# Patient Record
Sex: Female | Born: 1965 | Race: White | Hispanic: No
Health system: Western US, Academic
[De-identification: ages and names within clinical notes are randomized; demographics above are authoritative.]

## PROBLEM LIST (undated history)

## (undated) DIAGNOSIS — Z9889 Other specified postprocedural states: Secondary | ICD-10-CM

## (undated) DIAGNOSIS — Z87828 Personal history of other (healed) physical injury and trauma: Secondary | ICD-10-CM

## (undated) DIAGNOSIS — K759 Inflammatory liver disease, unspecified: Secondary | ICD-10-CM

## (undated) DIAGNOSIS — F3289 Other specified depressive episodes: Secondary | ICD-10-CM

## (undated) HISTORY — PX: NO PRIOR SURGERIES: 100

## (undated) HISTORY — DX: Inflammatory liver disease, unspecified: K75.9

## (undated) HISTORY — DX: Other specified postprocedural states: Z98.890

## (undated) HISTORY — DX: Personal history of other (healed) physical injury and trauma: Z87.828

## (undated) HISTORY — DX: Other specified depressive episodes: F32.89

---

## 2013-09-09 ENCOUNTER — Telehealth (HOSPITAL_BASED_OUTPATIENT_CLINIC_OR_DEPARTMENT_OTHER): Payer: Self-pay | Admitting: Orthopaedic Surgery

## 2013-09-09 NOTE — Telephone Encounter (Signed)
SCHEDULING INSTRUCTIONS WERE ADDED IN

## 2013-09-09 NOTE — Telephone Encounter (Signed)
CONFIRMED PHONE NUMBER: [phone]  CALLERS FIRST AND LAST NAME: Doyne KeelValerie Ann Erskin  FACILITY NAME: na TITLE: na  CALLERS RELATIONSHIP:Self  RETURN CALL: General message on voicemail only     SUBJECT: Appointment Request   REASON FOR REQUEST: Patient request to set appointment with hand clinic no instruction yet,and she is on [employer] plan,please call her back. Thank you    REQUEST APPOINTMENT WITH: any  REFERRING PROVIDER: Dr. Reece LevyOSHANRAVAN, [facility] Inc[facility]IKA MADELEINE [phone]  REQUESTED DATE: asap  REQUESTED TIME: please call  UNABLE TO APPOINT: Other: No instruction Hmo plan

## 2013-09-09 NOTE — Telephone Encounter (Signed)
Patient scheduled using VTA (CCR could not access referral because clinic was entering in scheduling instructions at the same time patient was calling).    FYI.  Schedule with Gildardo Poundsuth Cook for 09/16/13.  New Patient disclosures read to patient & she declined to have call to check if medical records were sent & to hand carry images.  She wants the clinic to call referring provider and have them ordered.

## 2013-09-10 ENCOUNTER — Telehealth (HOSPITAL_BASED_OUTPATIENT_CLINIC_OR_DEPARTMENT_OTHER): Payer: Self-pay | Admitting: Adult Health

## 2013-09-10 NOTE — Telephone Encounter (Signed)
CONFIRMED PHONE NUMBER: [phone]  CALLERS FIRST AND LAST NAME: Doyne KeelValerie Ann Reed  FACILITY NAME: n/a TITLE: n/a  CALLERS RELATIONSHIP:Self  RETURN CALL: Detailed message on voicemail only     SUBJECT: Appointment Request   REASON FOR REQUEST: Wrist pain, patient cancelled appointment 3-24 because she starts work that day    REQUEST APPOINTMENT WITH: any  REFERRING PROVIDER: Arville Gorika Roshanravan  REQUESTED DATE: 09-10-13 to 09-15-13  REQUESTED TIME: any time besides Monday from 3-4 or Thursday from 1-3  UNABLE TO APPOINT: Schedule appears full

## 2013-09-12 ENCOUNTER — Ambulatory Visit (HOSPITAL_BASED_OUTPATIENT_CLINIC_OR_DEPARTMENT_OTHER): Payer: No Typology Code available for payment source | Attending: Adult Health | Admitting: Adult Health

## 2013-09-12 ENCOUNTER — Encounter (HOSPITAL_BASED_OUTPATIENT_CLINIC_OR_DEPARTMENT_OTHER): Payer: Self-pay | Admitting: Adult Health

## 2013-09-12 VITALS — BP 101/67 | HR 82 | Resp 18 | Ht 66.0 in | Wt 150.0 lb

## 2013-09-12 DIAGNOSIS — M24139 Other articular cartilage disorders, unspecified wrist: Secondary | ICD-10-CM | POA: Insufficient documentation

## 2013-09-12 DIAGNOSIS — M25539 Pain in unspecified wrist: Secondary | ICD-10-CM | POA: Insufficient documentation

## 2013-09-12 DIAGNOSIS — M25532 Pain in left wrist: Secondary | ICD-10-CM | POA: Insufficient documentation

## 2013-09-12 DIAGNOSIS — M25332 Other instability, left wrist: Secondary | ICD-10-CM

## 2013-09-12 NOTE — Patient Instructions (Signed)
Please wear your splint 24/7 except to bathe and return on 10-22-13 for follow up appointment with Dr. Isaias CowmanAllan.
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
## 2013-09-16 ENCOUNTER — Encounter (HOSPITAL_BASED_OUTPATIENT_CLINIC_OR_DEPARTMENT_OTHER): Payer: No Typology Code available for payment source | Admitting: Adult Health

## 2013-10-22 ENCOUNTER — Ambulatory Visit (HOSPITAL_BASED_OUTPATIENT_CLINIC_OR_DEPARTMENT_OTHER): Payer: No Typology Code available for payment source | Attending: Orthopaedic Surgery | Admitting: Orthopaedic Surgery

## 2013-10-22 ENCOUNTER — Encounter (HOSPITAL_BASED_OUTPATIENT_CLINIC_OR_DEPARTMENT_OTHER): Payer: Self-pay | Admitting: Orthopaedic Surgery

## 2013-10-22 ENCOUNTER — Ambulatory Visit (HOSPITAL_BASED_OUTPATIENT_CLINIC_OR_DEPARTMENT_OTHER)
Payer: No Typology Code available for payment source | Attending: Orthopaedic Surgery | Admitting: Rehabilitative and Restorative Service Providers"

## 2013-10-22 VITALS — BP 97/70 | HR 71 | Ht 66.0 in | Wt 150.0 lb

## 2013-10-22 DIAGNOSIS — M25532 Pain in left wrist: Secondary | ICD-10-CM

## 2013-10-22 DIAGNOSIS — M25539 Pain in unspecified wrist: Secondary | ICD-10-CM | POA: Insufficient documentation

## 2013-10-22 DIAGNOSIS — IMO0001 Reserved for inherently not codable concepts without codable children: Secondary | ICD-10-CM | POA: Insufficient documentation

## 2013-10-22 NOTE — Progress Notes (Signed)
L-Code Note     A   left ulnar gutter custom orthosis was fabricated by this therapist on 10/22/2013. In addition, a custom neoprene orthosis was fabricated to wear in two weeks after letting wrist rest in custom orthosis.  Pt. was given verbal and written instructions for wear, care, precautions, and clinic contact information should problems arise.     Patient endorsed comfort and agreed with purpose and goal of orthosis function.      L-Code: Z6109L3906 - custom   L-Code: U0454: L3923 - custom neoprene     Statement of Medical Necessity / Purpose of Orthosis: To rest the ulnar side of the wrist     Total time:  7745    Freeman CaldronKristy Uddin, OTR/L, CHT  Occupational Therapist, Certified Art therapistHand Therapist  Physical and Hand Therapy   Department of Rehabilitation Medicine

## 2013-10-23 ENCOUNTER — Telehealth (HOSPITAL_BASED_OUTPATIENT_CLINIC_OR_DEPARTMENT_OTHER): Payer: Self-pay | Admitting: Orthopaedic Surgery

## 2013-10-23 NOTE — Telephone Encounter (Signed)
CONFIRMED PHONE NUMBER: [phone]  CALLERS FIRST AND LAST NAME: Diane  FACILITY NAME: [facility] TITLE: NA  CALLERS RELATIONSHIP:OTHER  RETURN CALL: OK to leave detailed message with anyone that answers     SUBJECT: Requesting Orders   REASON FOR REQUEST: Diane from [facility] called requesting orders for occupational therapy to be submitted for the patient. If there are any questions please call [phone]. Okay to leave any information with either Orpha BurKaty or Mandy at the front desk.     ORDERING PROVIDER: Dr. Landis Martinshristopher Allan  REQUESTS AN ORDER FOR: Other Occupational Therapy  ABLE TO PICK UP LAB SLIP AT FRONT DESK: NO, please fax to [phone]

## 2013-10-23 NOTE — Telephone Encounter (Signed)
OT referral faxed to the fax # provided.
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
## 2013-12-03 ENCOUNTER — Encounter (HOSPITAL_BASED_OUTPATIENT_CLINIC_OR_DEPARTMENT_OTHER): Payer: No Typology Code available for payment source | Admitting: Orthopaedic Surgery

## 2017-01-13 IMAGING — MG MAMMO SCRN BIL W/CAD TOMO
8 series · 8 of 24 positions shown · non-contrast
Comparison: none

Images Obtained from [facility]
CLINICAL RA REF: Mammo Scrn bilateral (Digital) W/Cad Routine with Tomosynthesis.
Digital images were generated from the 3D Tomosynthesis data acquired during the exam.
No prior exams were available for comparison.
The breasts are heterogeneously dense, which may obscure small masses.
Current study was also evaluated with a Computer Aided Detection (CAD) system.
There is a possible oval mass with an obscured margin in the right breast middle depth lateral region seen on the craniocaudal view only.
No other significant abnormalities are seen in either breast.
Your patient's mammogram demonstrates that she has dense breast tissue, which could hide small abnormalities.  In compliance with [place] the patient has been sent a letter which informs
her that she has dense breast tissue and might benefit from supplementary screening tests depending on her individual risk factors.  The patient may contact you if she has any questions or concerns.

[L CC]
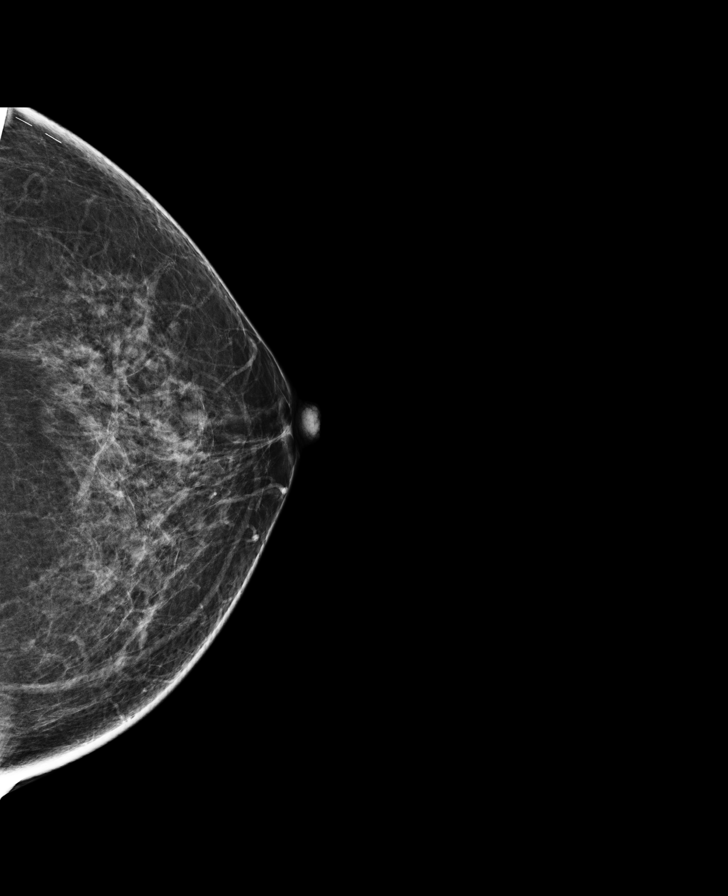

[R MLO]
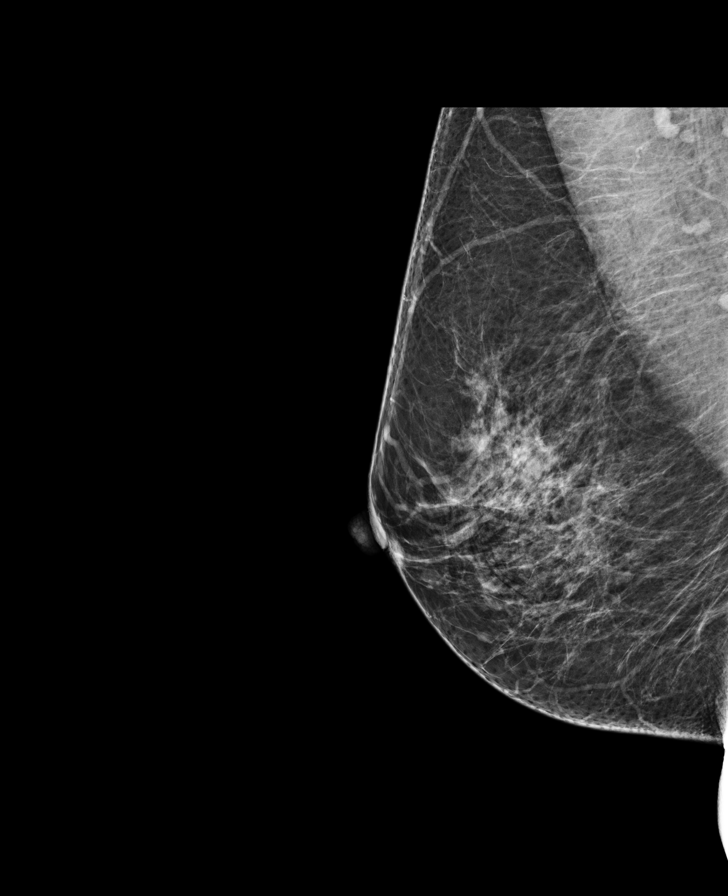

[L MLO]
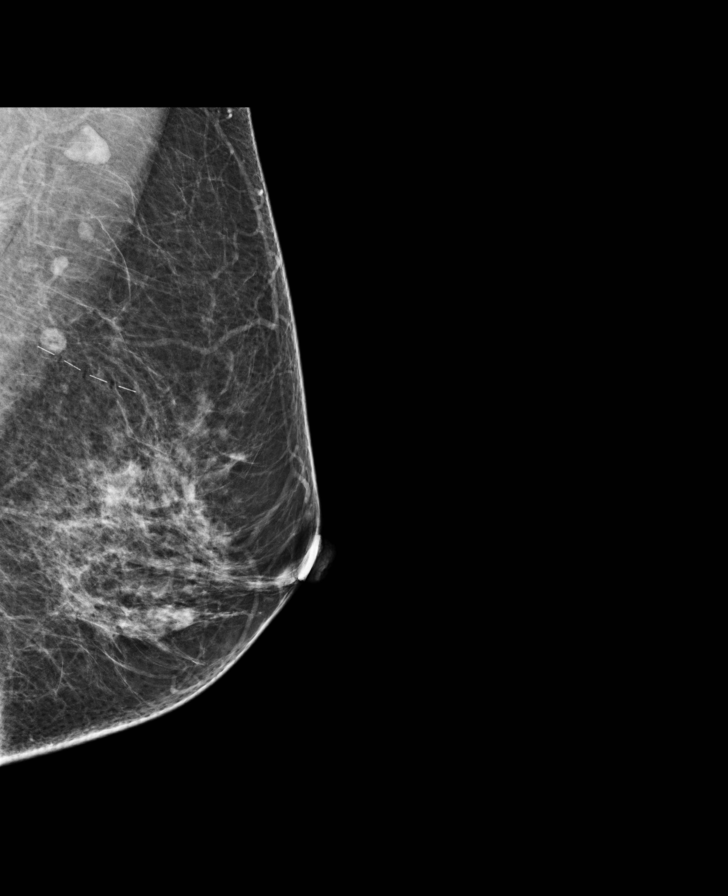

[R CC]
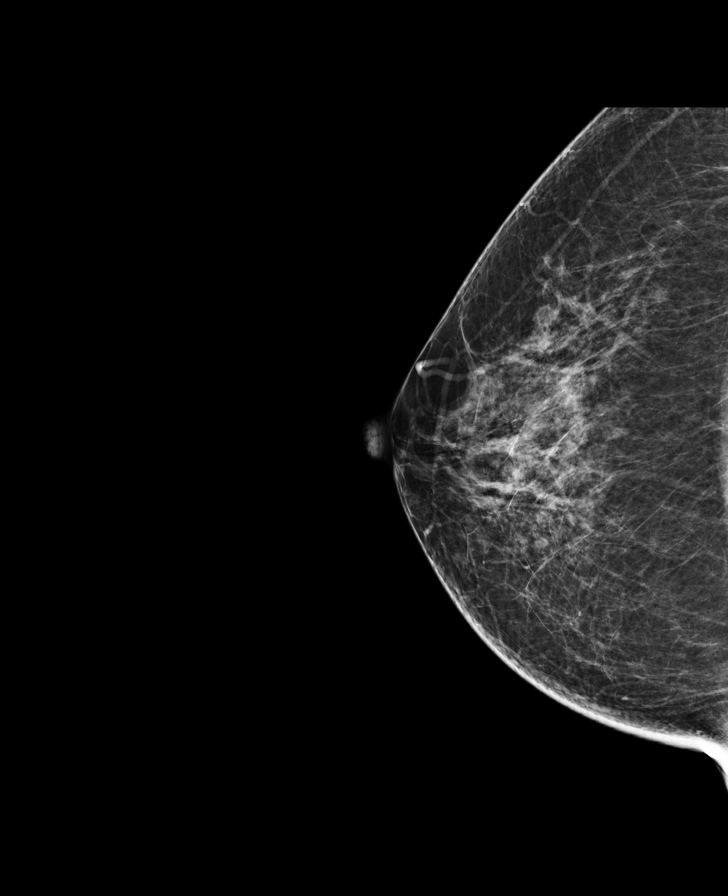

[L CC tomo · tomo slice 31/61.0]
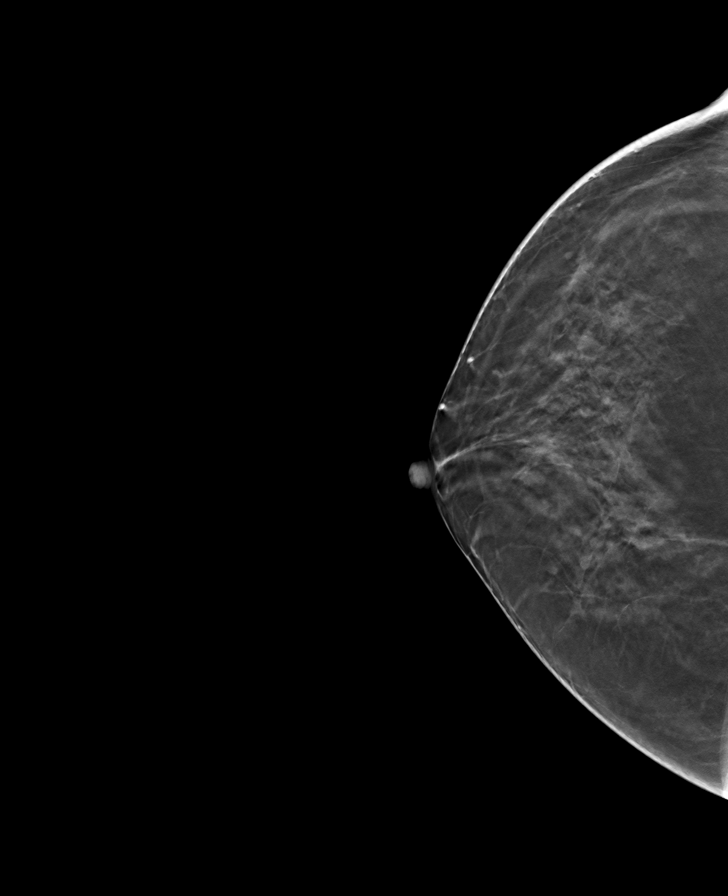

[R CC tomo · tomo slice 32/63.0]
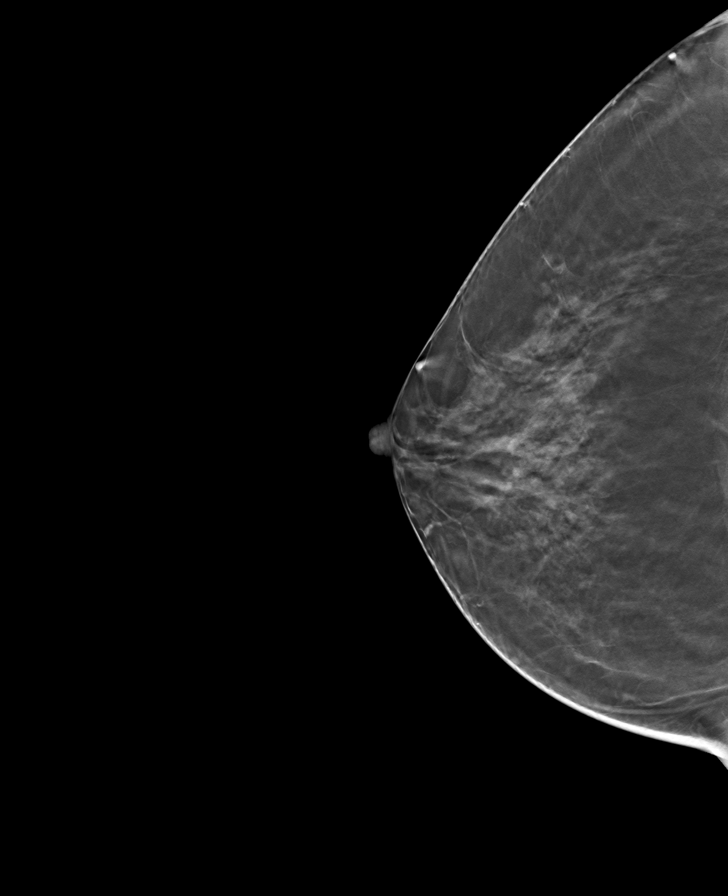

[R MLO tomo · tomo slice 33/66.0]
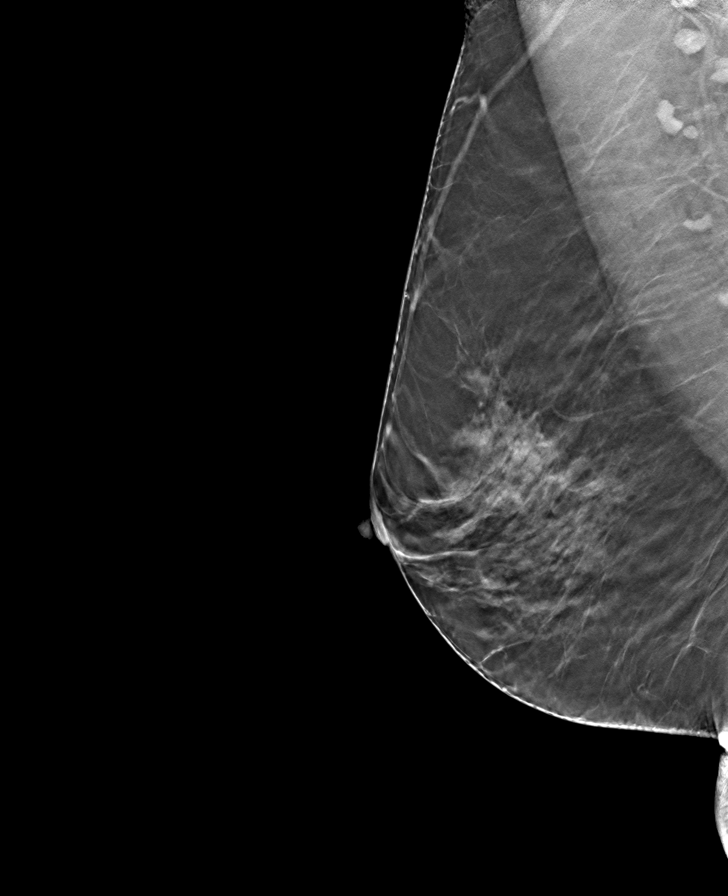

[L MLO tomo · tomo slice 33/64.0]
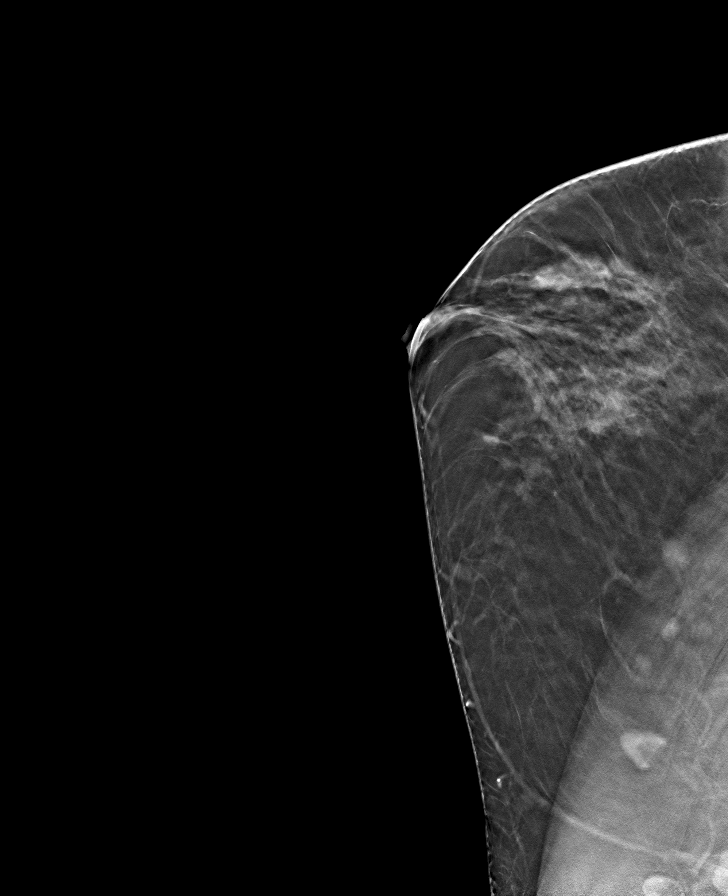

[8 of 24 positions shown; findings below may reference images not displayed]

IMPRESSION: INCOMPLETE:  NEED ADDITIONAL IMAGING EVALUATION
The possible oval mass in the right breast is indeterminate.  Additional views with possible ultrasound are recommended unless previous films are received and show no significant interval change.
mwm/penrad:01/24/2017 [DATE]
letter sent: Birad 0
FINAL ASSESSMENT: BI-RADS:Category 0: Incomplete:  Need Additional Imaging Evaluation

## 2017-03-12 IMAGING — MG MAMMO DIAG RT W/CAD TOMO
6 series · 6 of 18 positions shown · non-contrast
Comparison: 01/13/2017.

INDICATION: Right breast mass.
TECHNIQUE: Right 2-D digital diagnostic mammogram was performed followed by 3-D tomosynthesis. Current study was also evaluated with a computer aided detection (CAD) system. Targeted right breast ultrasound was also performed.

[R LM]
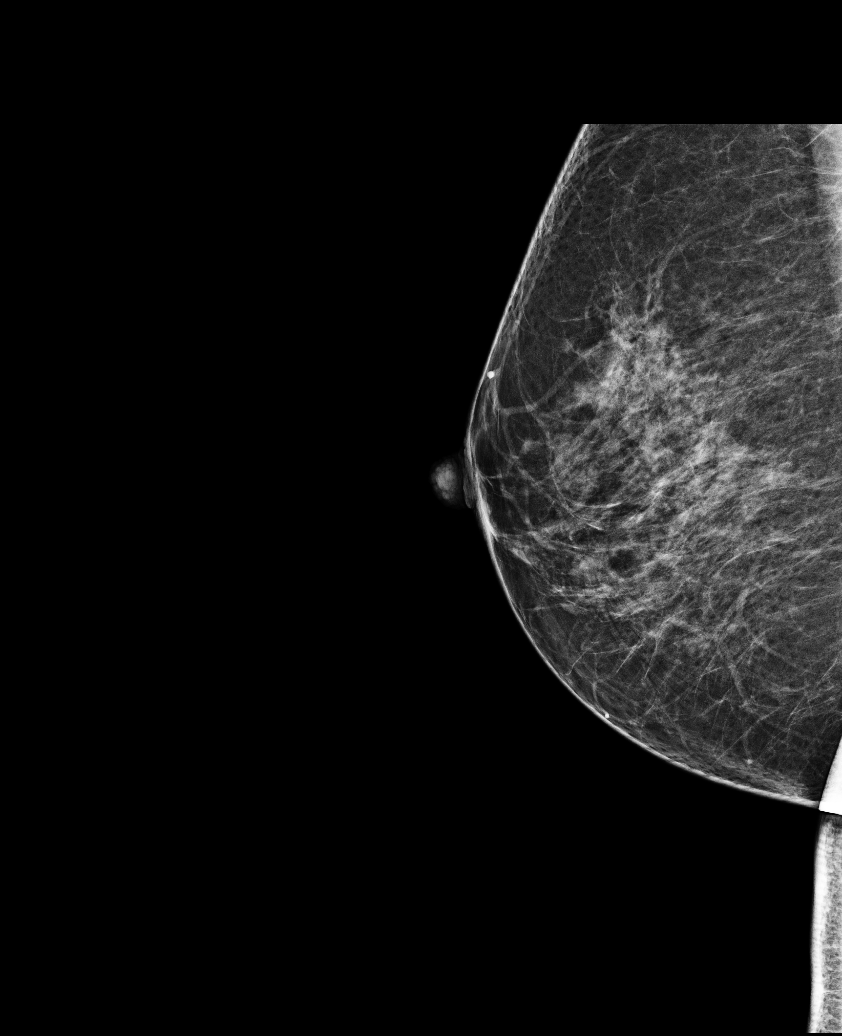

[R CC (1 of 2)]
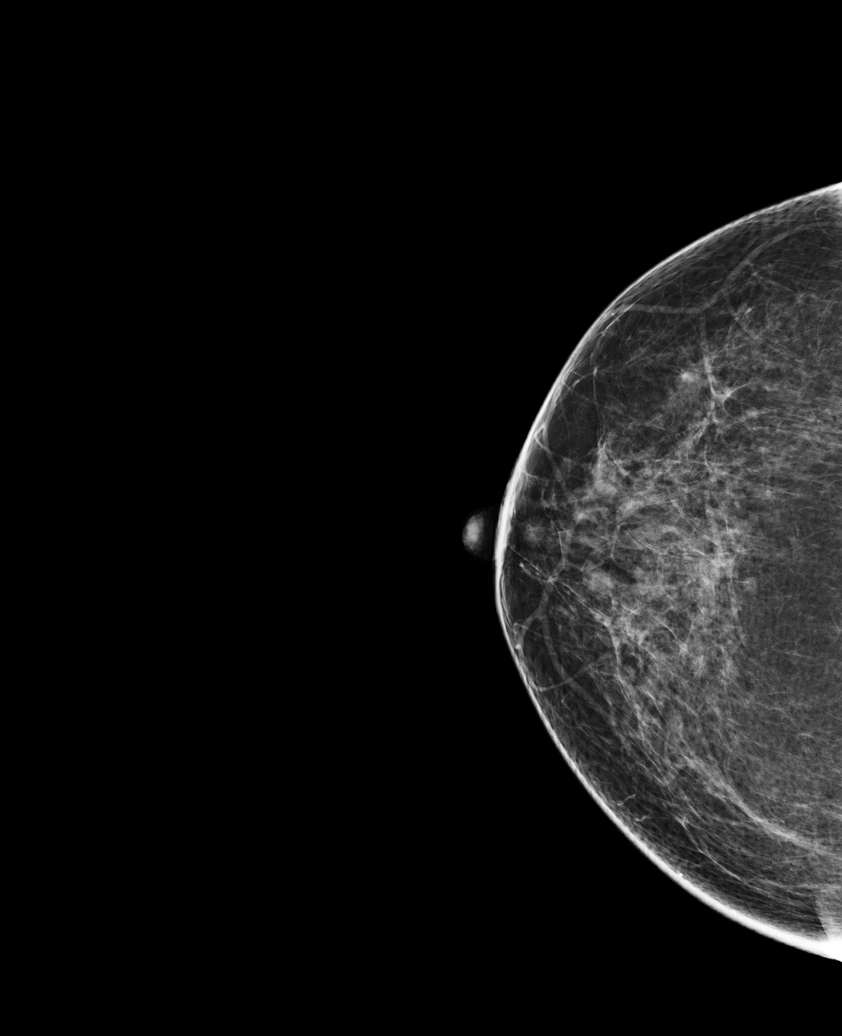

[R CC (2 of 2)]
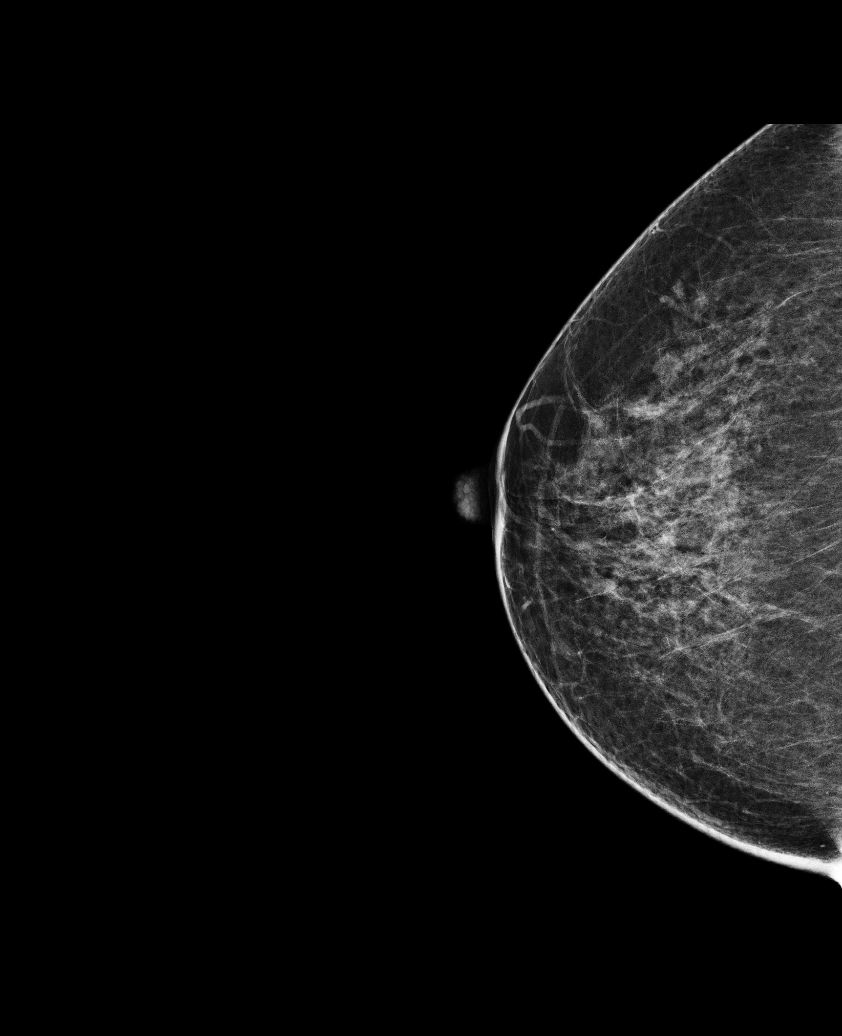

[R CC tomo (1 of 2) · tomo slice 34/67.0]
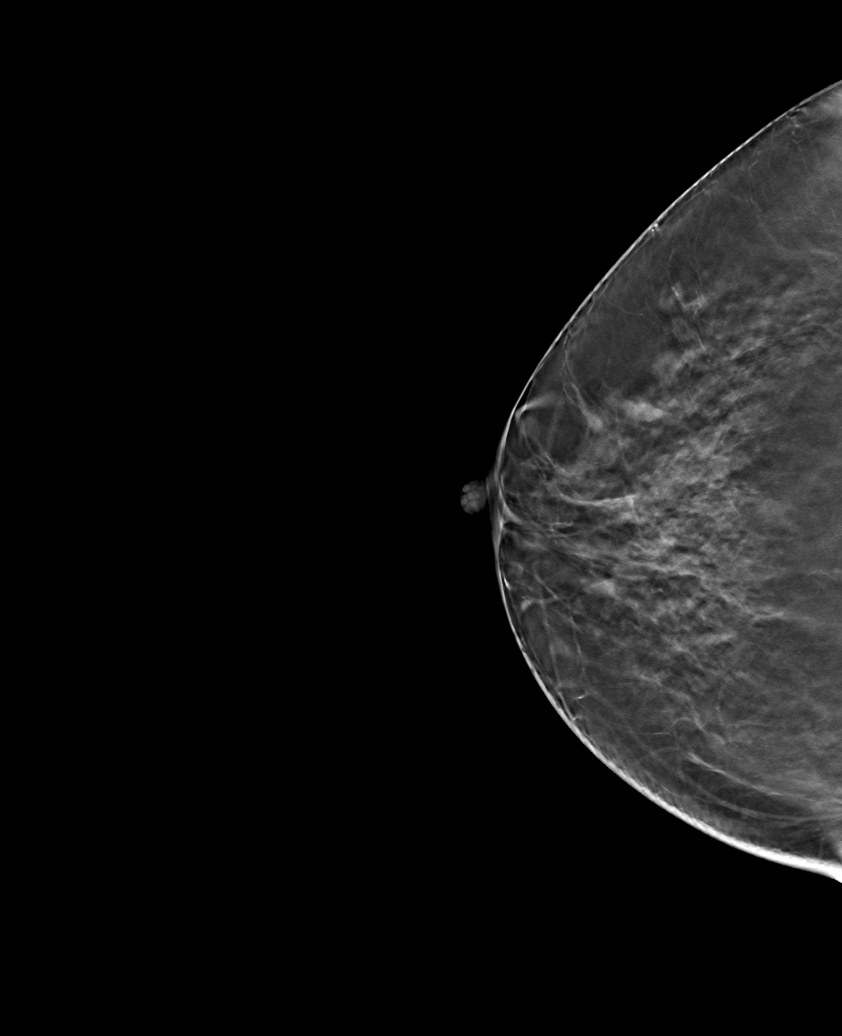

[R LM tomo · tomo slice 33/64.0]
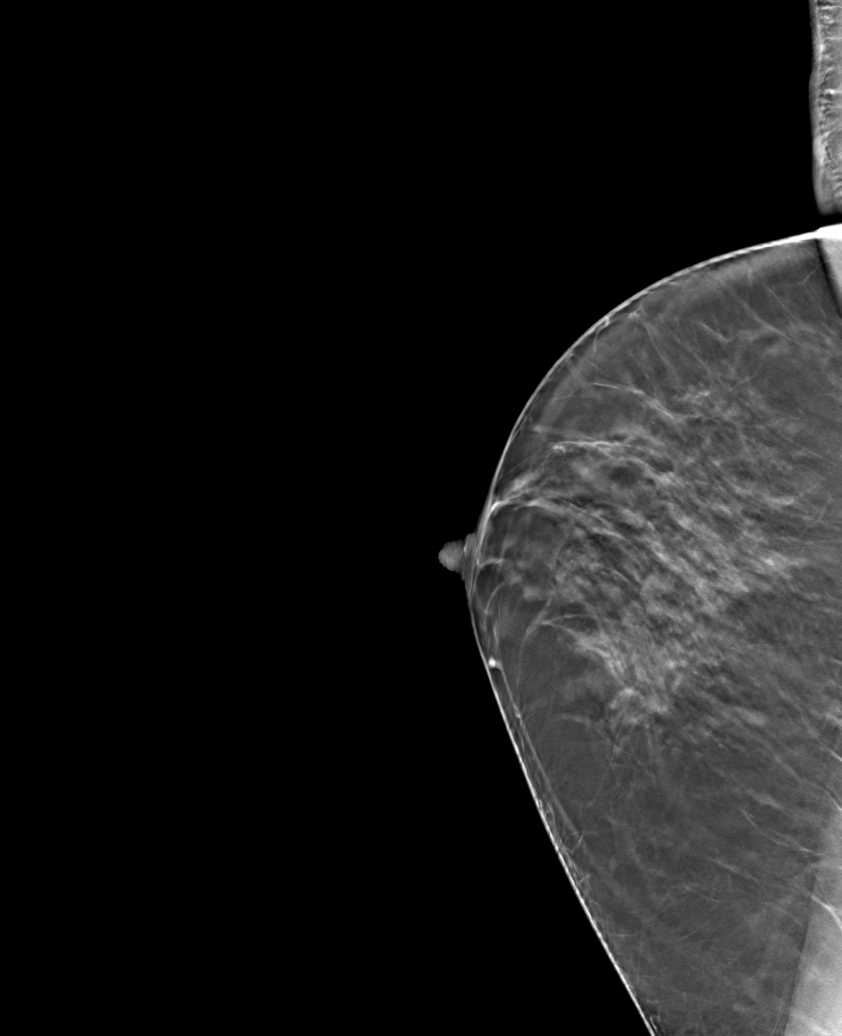

[R CC tomo (2 of 2) · tomo slice 34/67.0]
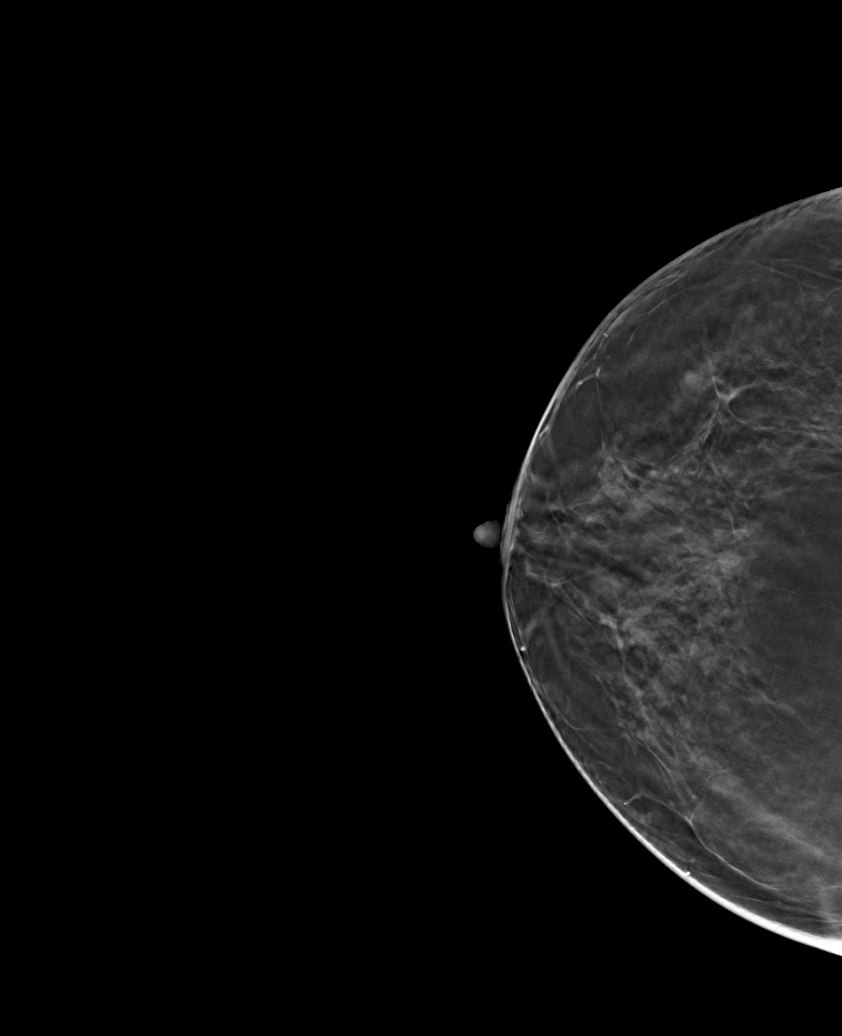

[6 of 18 positions shown; findings below may reference images not displayed]

FINDINGS: RIGHT DIAGNOSTIC MAMMOGRAM:  The breast is heterogeneously dense, which may obscure small masses.  Additional views confirm a circumscribed oval mass in the right breast 9 o'clock middle depth.

TARGETED RIGHT BREAST ULTRASOUND: In the right breast at 9 o'clock middle depth there is a 0.5 cm benign simple cyst. This corresponds to the mammographic finding. No suspicious sonographic abnormality.
IMPRESSION: There is no mammographic or targeted sonographic evidence of malignancy. A 1 year screening mammogram is recommended.  

FINAL ASSESSMENT: BI-RADS: Category 2 Benign
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
## 2017-03-12 IMAGING — US US BREAST RT LTD
1 series · 11 of 11 positions shown · non-contrast
Comparison: 01/13/2017.

INDICATION: Right breast mass.
TECHNIQUE: Right 2-D digital diagnostic mammogram was performed followed by 3-D tomosynthesis. Current study was also evaluated with a computer aided detection (CAD) system. Targeted right breast ultrasound was also performed.

[Series 1: us breast right ltd · 11 of 11 slices shown]
[im 1/11]
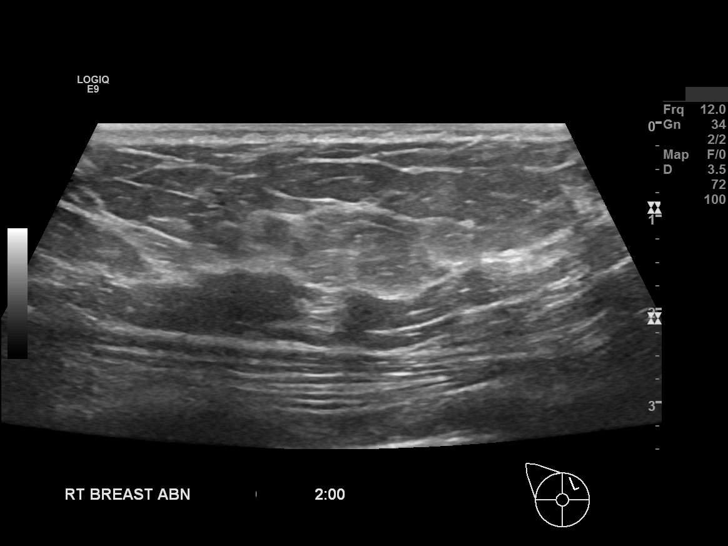
[im 2/11]
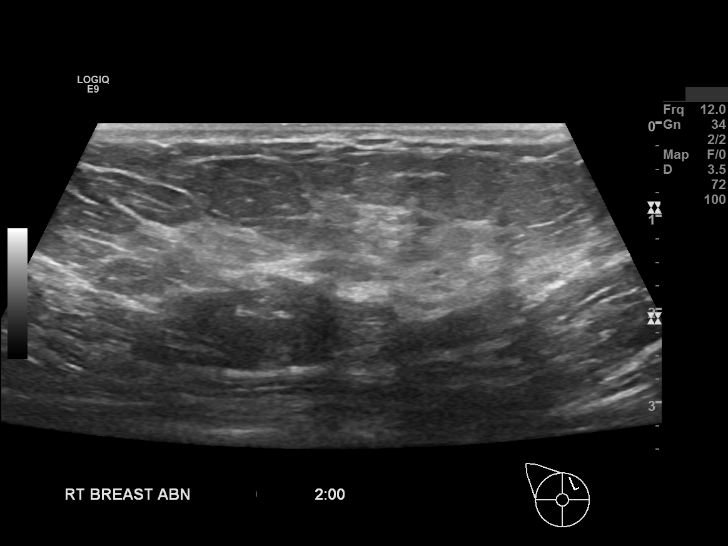
[im 3/11]
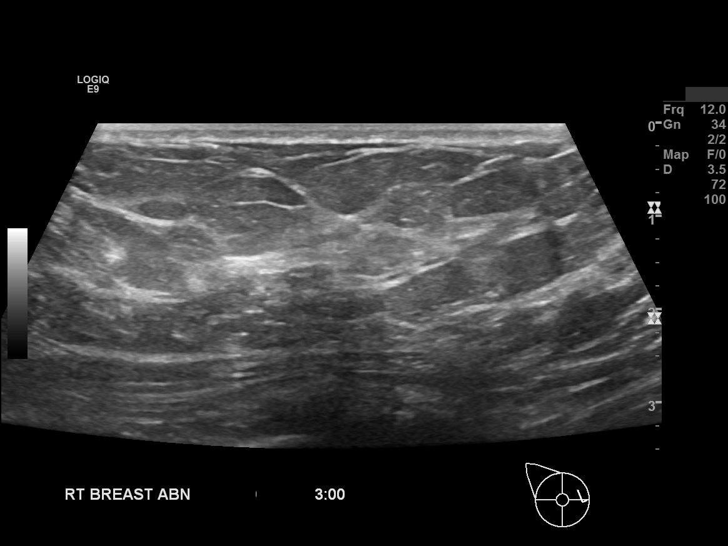
[im 4/11]
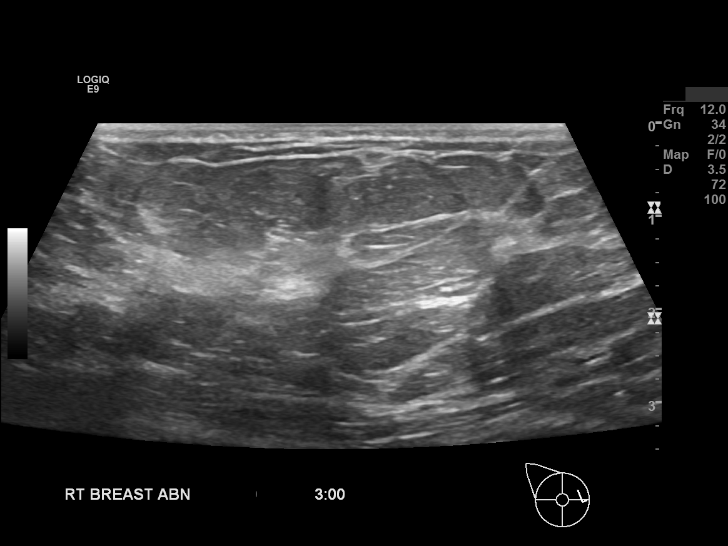
[im 5/11]
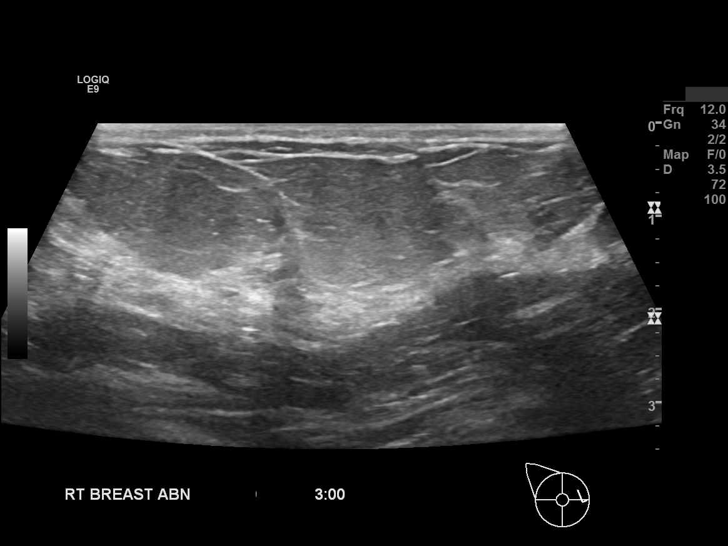
[im 6/11]
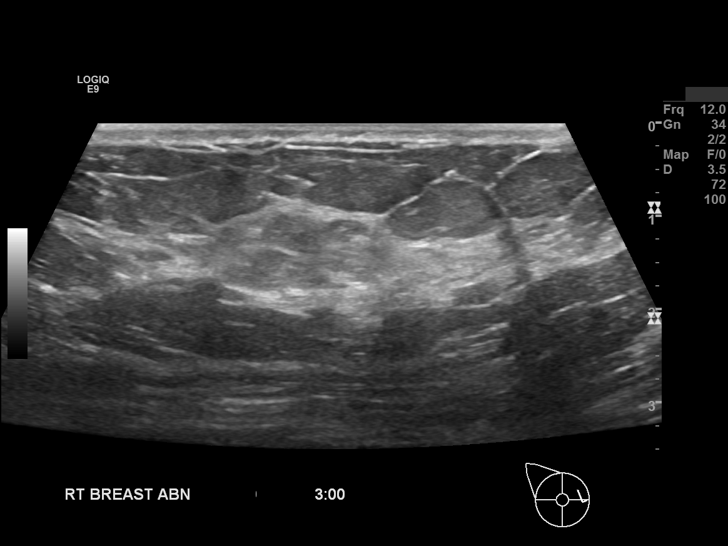
[im 7/11]
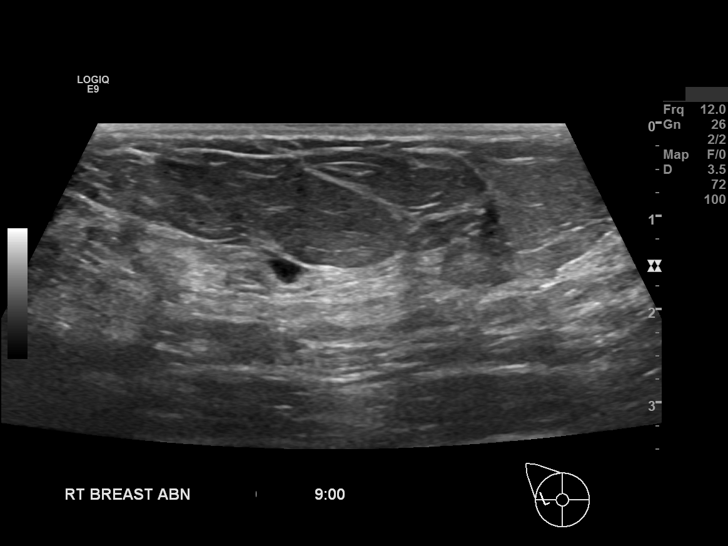
[im 8/11]
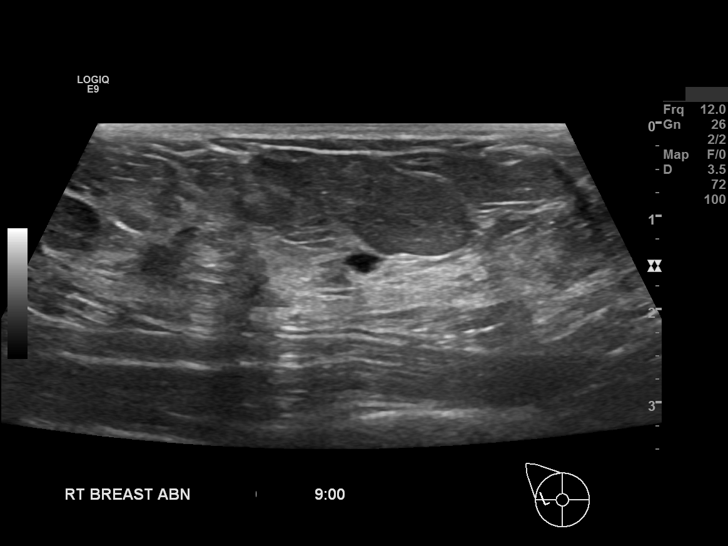
[im 9/11]
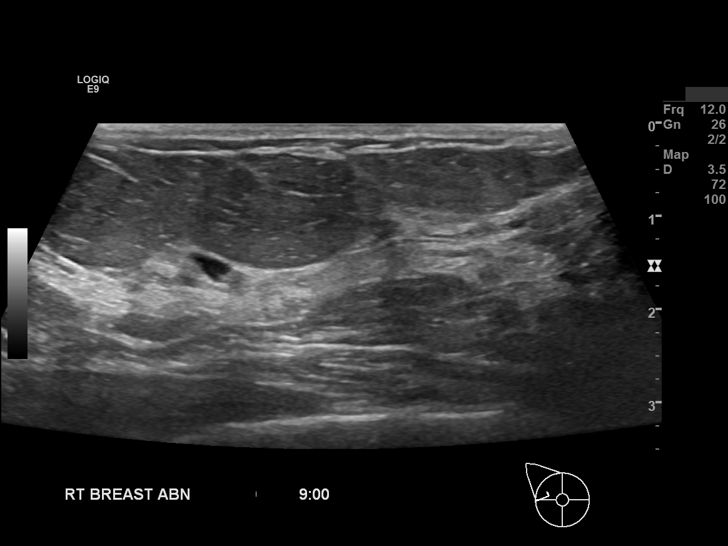
[im 10/11]
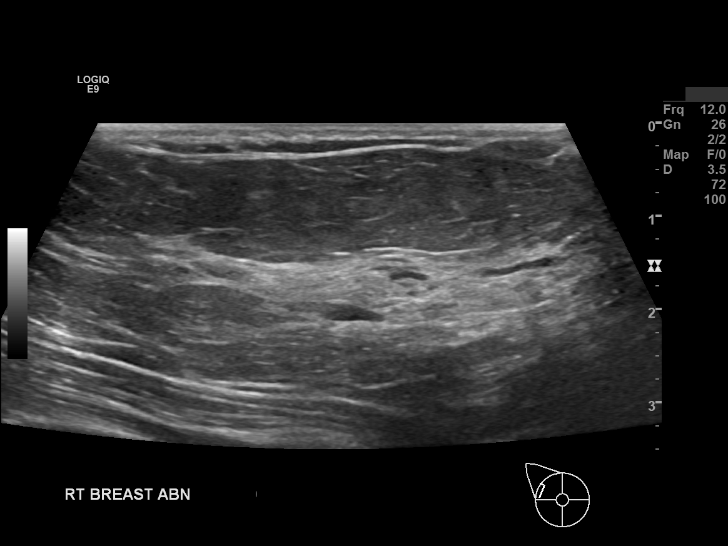
[im 11/11]
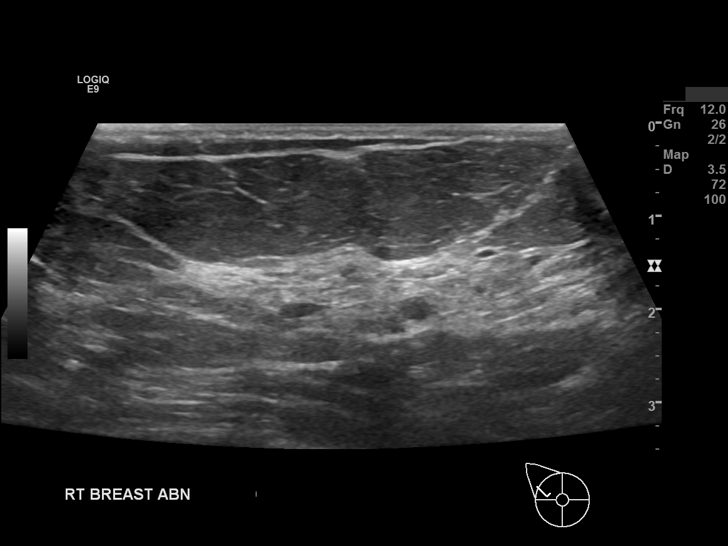

[11 of 11 positions shown; findings below may reference images not displayed]

FINDINGS: RIGHT DIAGNOSTIC MAMMOGRAM:  The breast is heterogeneously dense, which may obscure small masses.  Additional views confirm a circumscribed oval mass in the right breast 9 o'clock middle depth.

TARGETED RIGHT BREAST ULTRASOUND: In the right breast at 9 o'clock middle depth there is a 0.5 cm benign simple cyst. This corresponds to the mammographic finding. No suspicious sonographic abnormality.
IMPRESSION: There is no mammographic or targeted sonographic evidence of malignancy. A 1 year screening mammogram is recommended.  

FINAL ASSESSMENT: BI-RADS: Category 2 Benign

## 2017-03-12 IMAGING — US US LIVER
1 series · 14 of 25 positions shown · non-contrast
Comparison: None

Ultrasound liver
INDICATION: Nutritional disease.
TECHNIQUE: Ultrasound liver obtained with grayscale.

[Series 1: us liver · 14 of 29 slices shown]
[im 1/29]
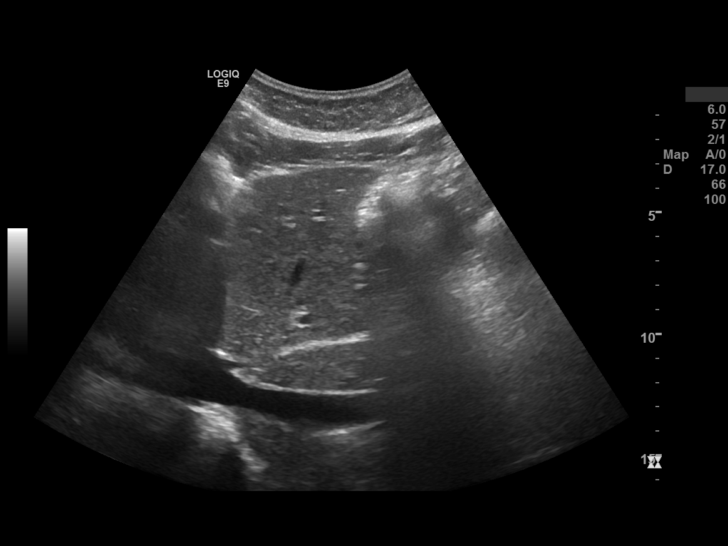
[im 3/29]
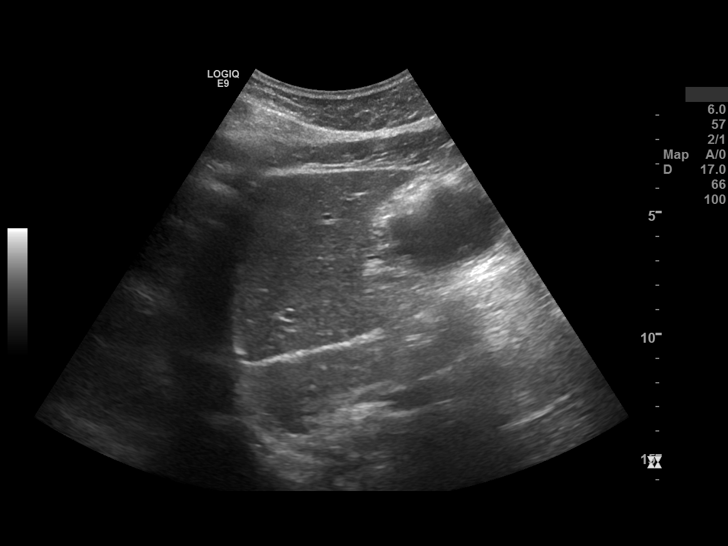
[im 5/29]
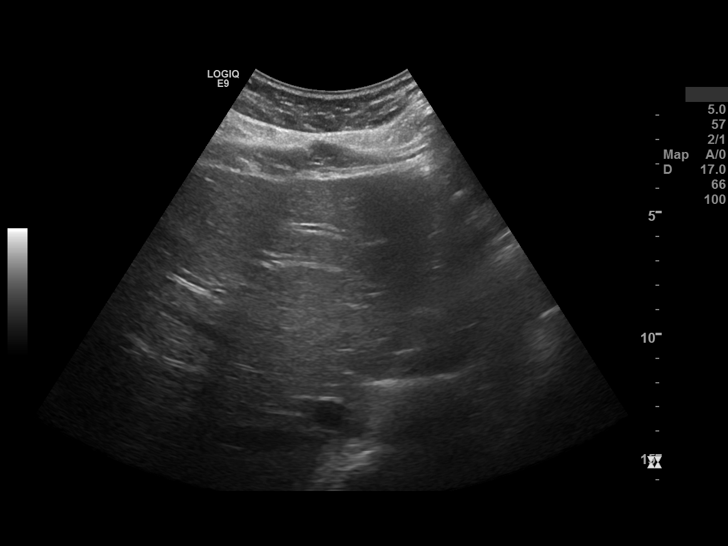
[im 8/29]
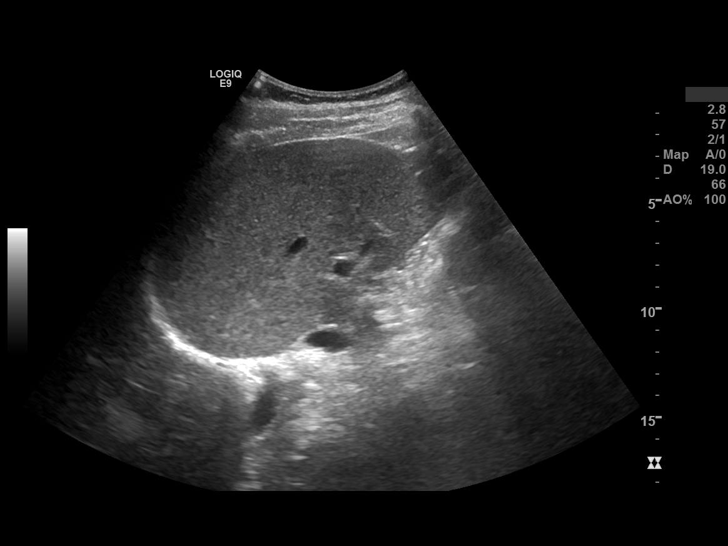
[im 10/29]
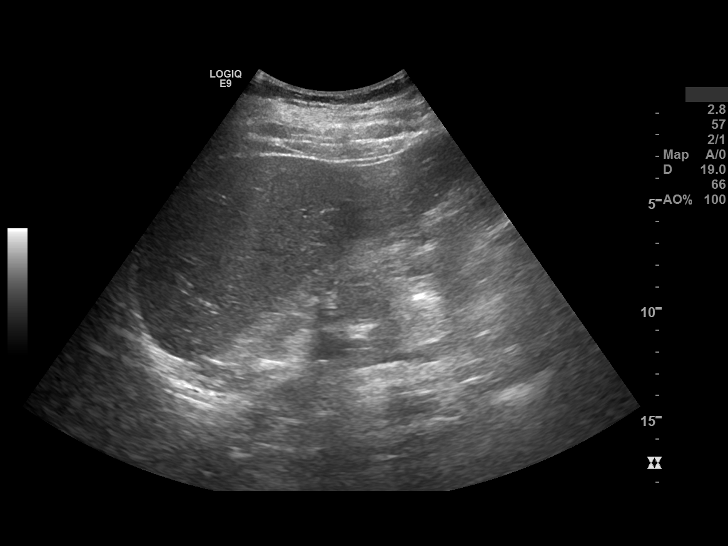
[im 11/29]
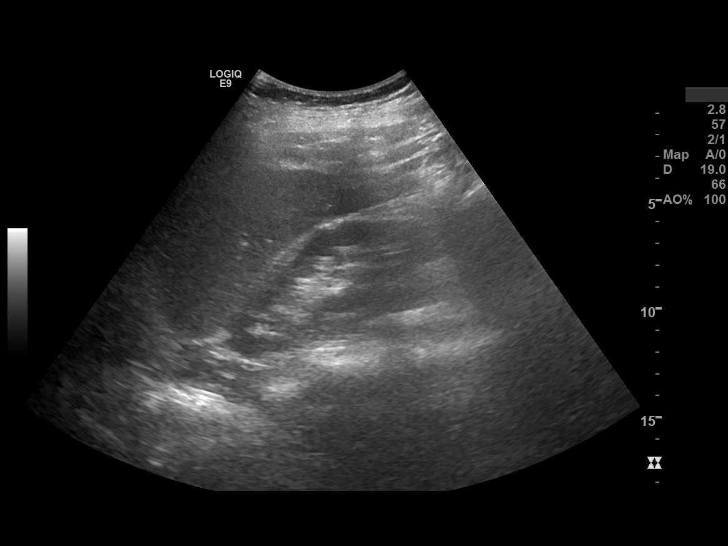
[im 13/29]
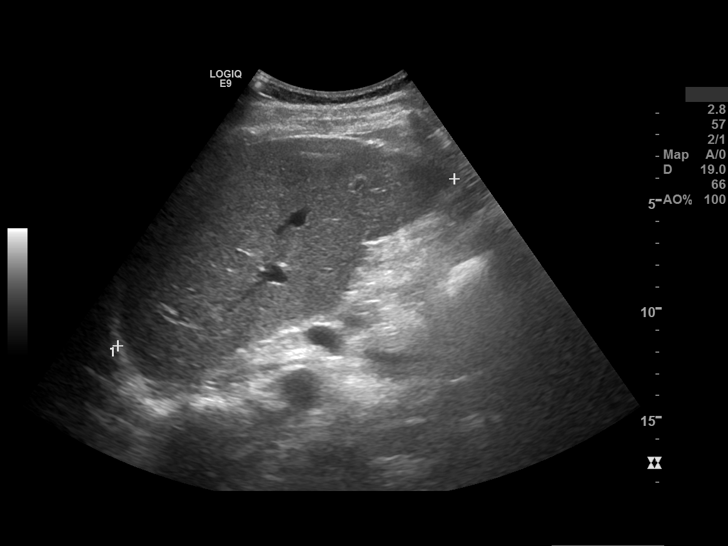
[im 16/29]
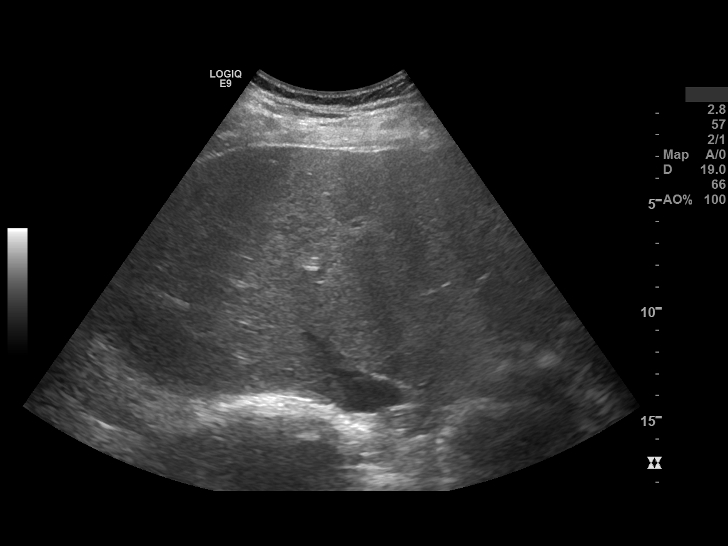
[im 18/29]
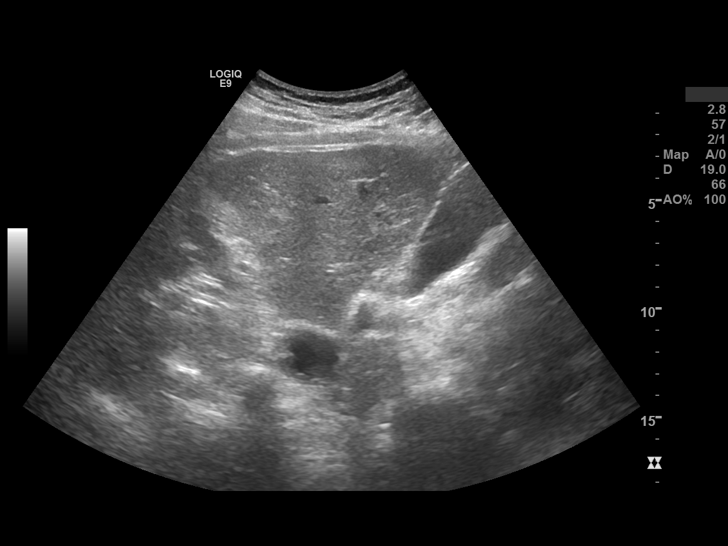
[im 19/29]
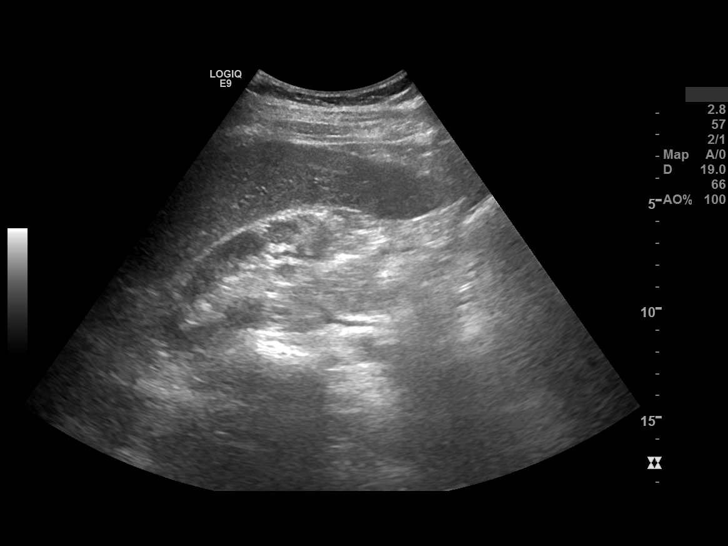
[im 22/29]
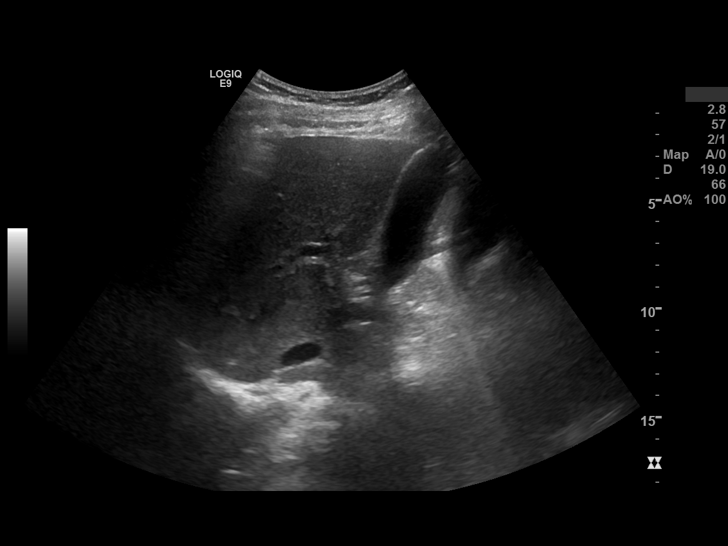
[im 24/29]
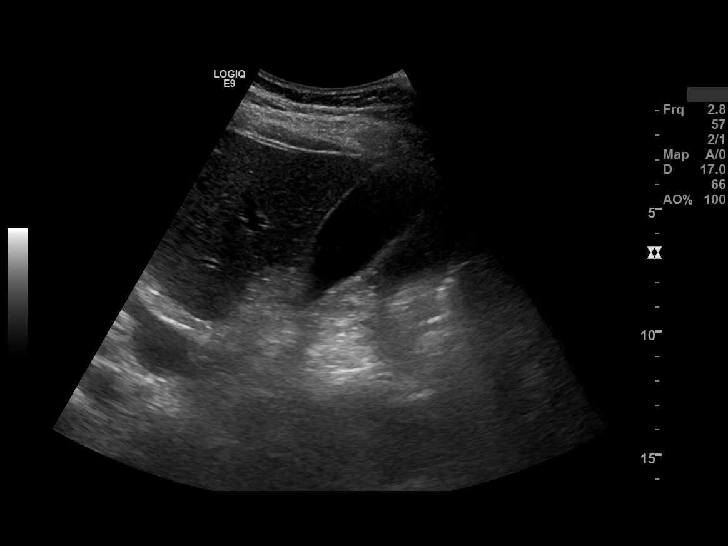
[im 26/29]
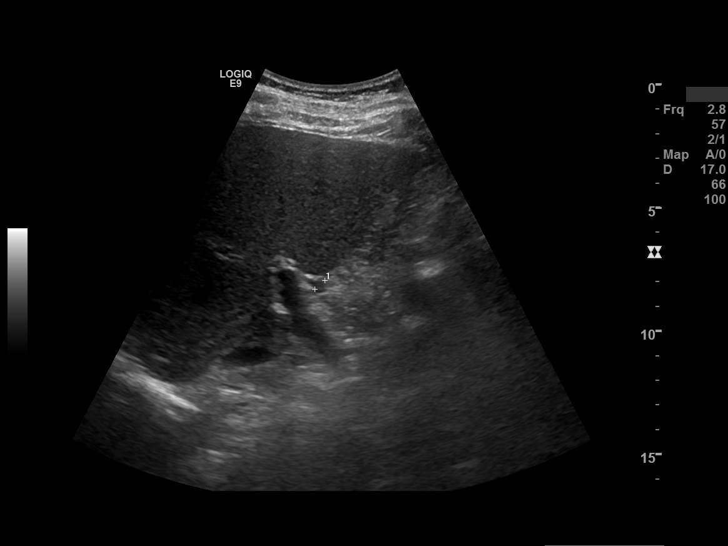
[im 29/29]
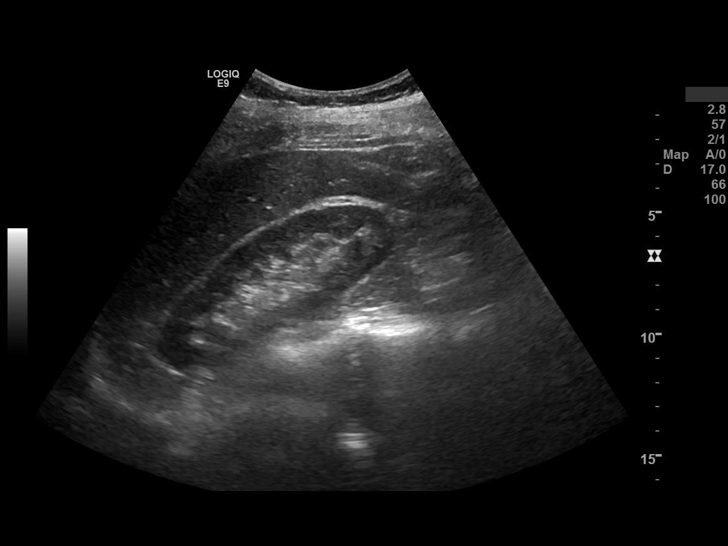

[14 of 25 positions shown; findings below may reference images not displayed]

FINDINGS: The visualized liver is sonographically unremarkable. No echogenic mass or intrahepatic ductal dilatation. The right hepatic lobe measures 17.3 cm.

The gallbladder is sonographically unremarkable without echogenic stones or biliary sludge. Negative for sonographic Murphy's sign.

Gallbladder wall measures 2 mm.

Common bile duct measures 5 mm, within normal limits.

Visualized right kidney is sonographically unremarkable.

Main portal vein is patent and unremarkable.
IMPRESSION: 1.
Unremarkable sonogram of the visualized abdomen. The liver is sonographically unremarkable.

2.
The gallbladder is sonographically unremarkable.

## 2018-02-27 IMAGING — MG MAMMO SCRN BIL W/CAD TOMO
8 series · 8 of 24 positions shown · non-contrast
Comparison: none

Images Obtained from [facility]
HISTORY: Patient is 52 years old and is seen for screening. The patient has no personal history of cancer. The patient has no family history of breast cancer.
FILMS COMPARED:
The present examination has been compared to prior imaging studies.
TECHNIQUE: Bilateral 2-D digital screening mammogram was performed followed by 3-D tomosynthesis.  Current study was also evaluated with a computer aided detection (CAD) system.
MAMMOGRAM FINDINGS:
There are scattered areas of fibroglandular density.
No suspicious abnormality is seen in either breast.  There are no significant changes from the prior study.

[L CC]
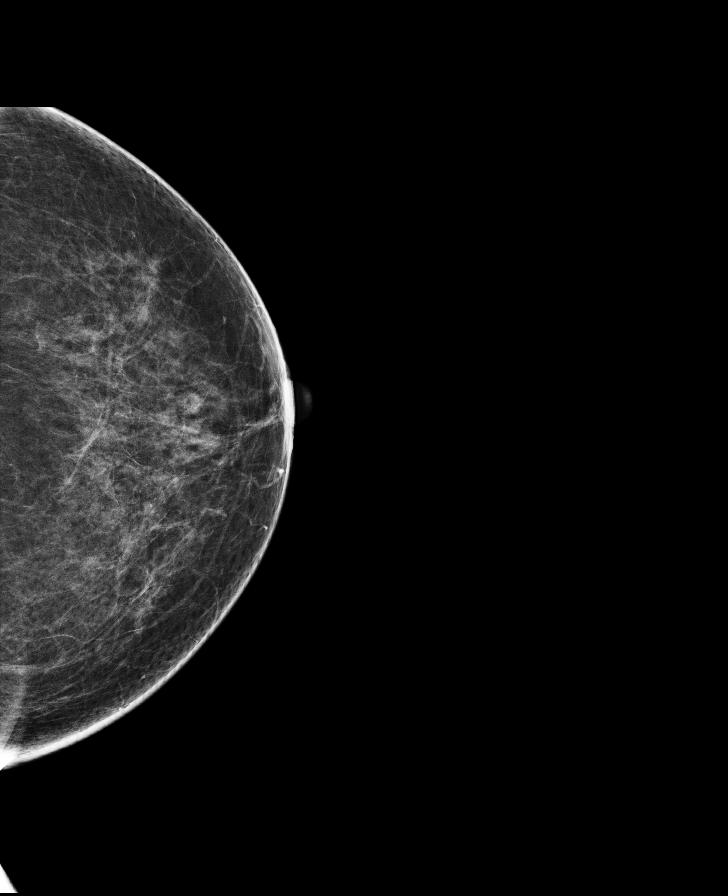

[R MLO]
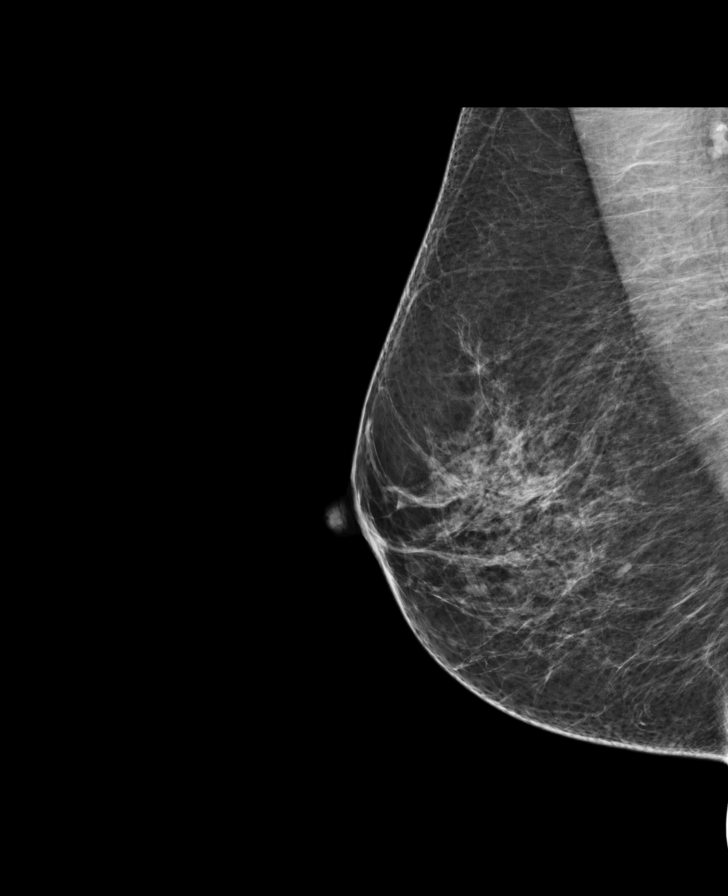

[R CC]
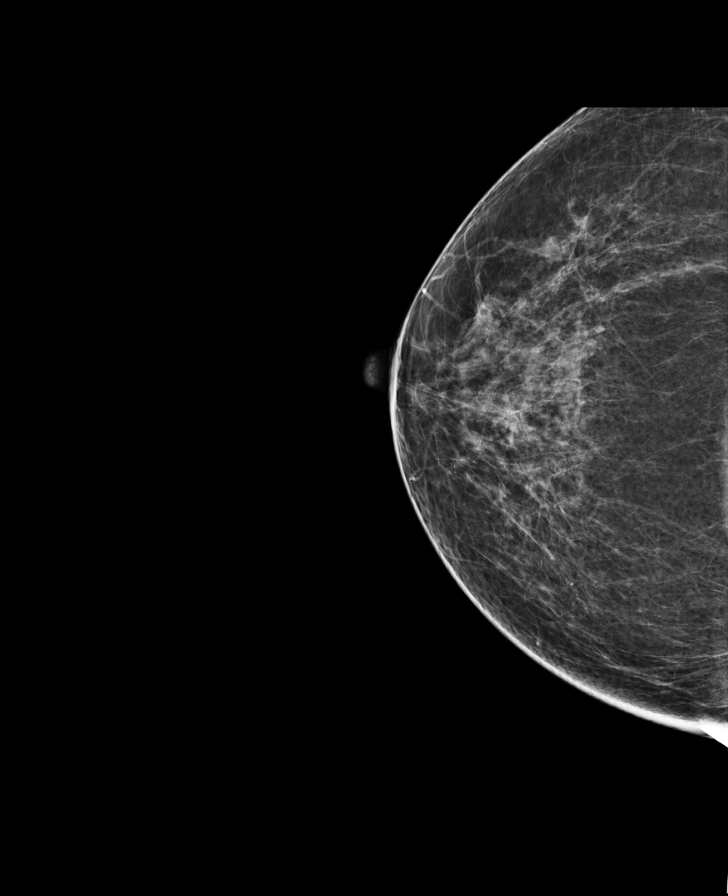

[L MLO]
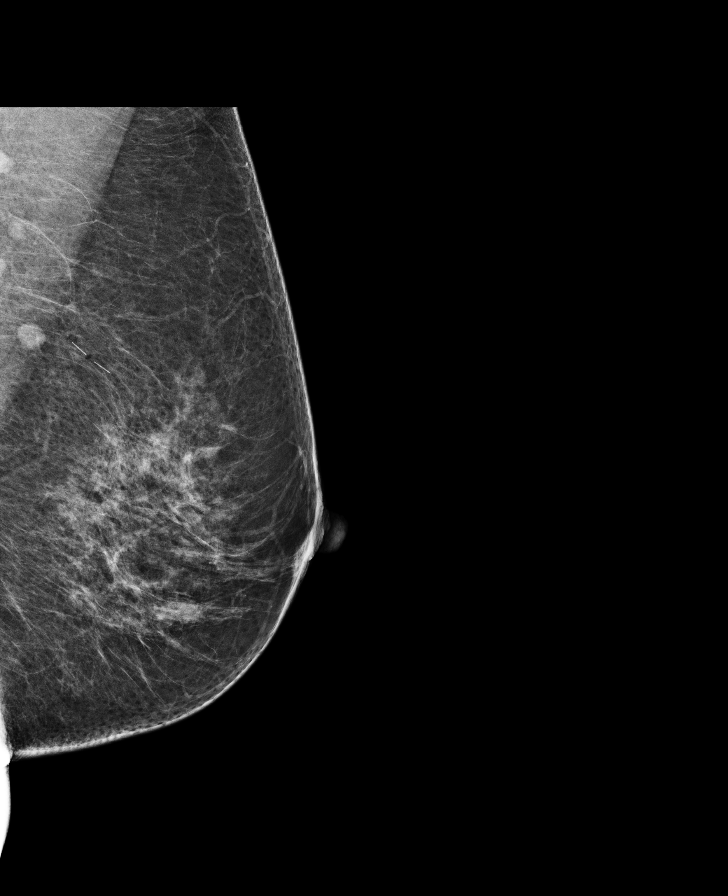

[L MLO tomo · tomo slice 37/74.0]
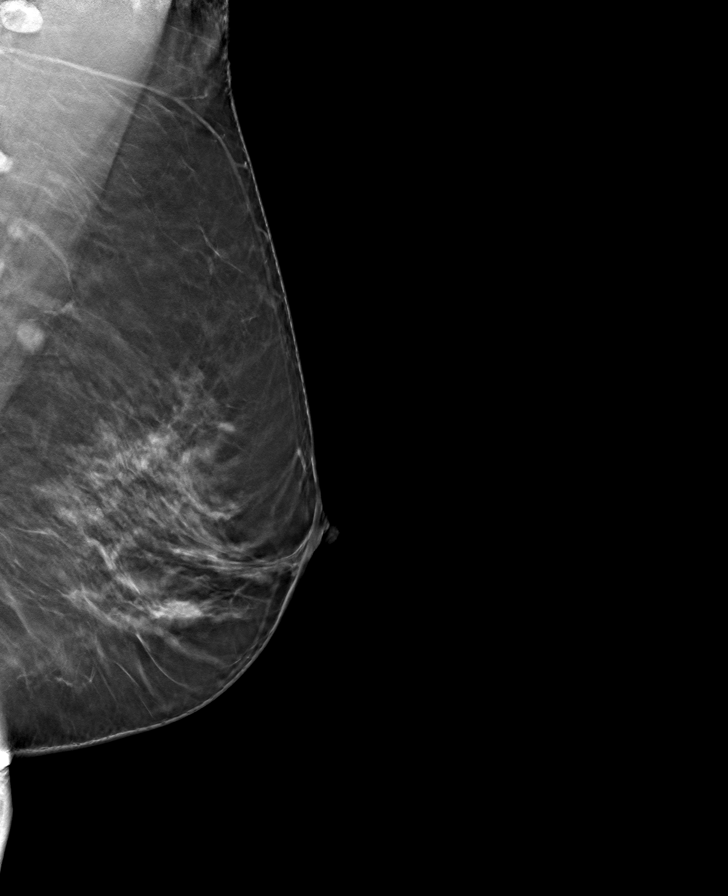

[L CC tomo · tomo slice 34/67.0]
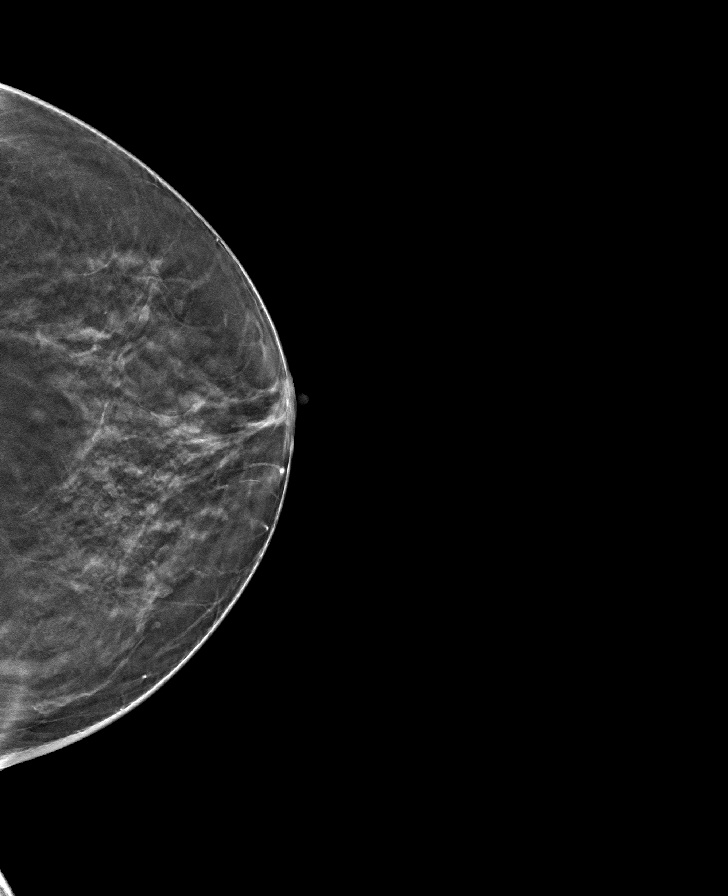

[R MLO tomo · tomo slice 34/67.0]
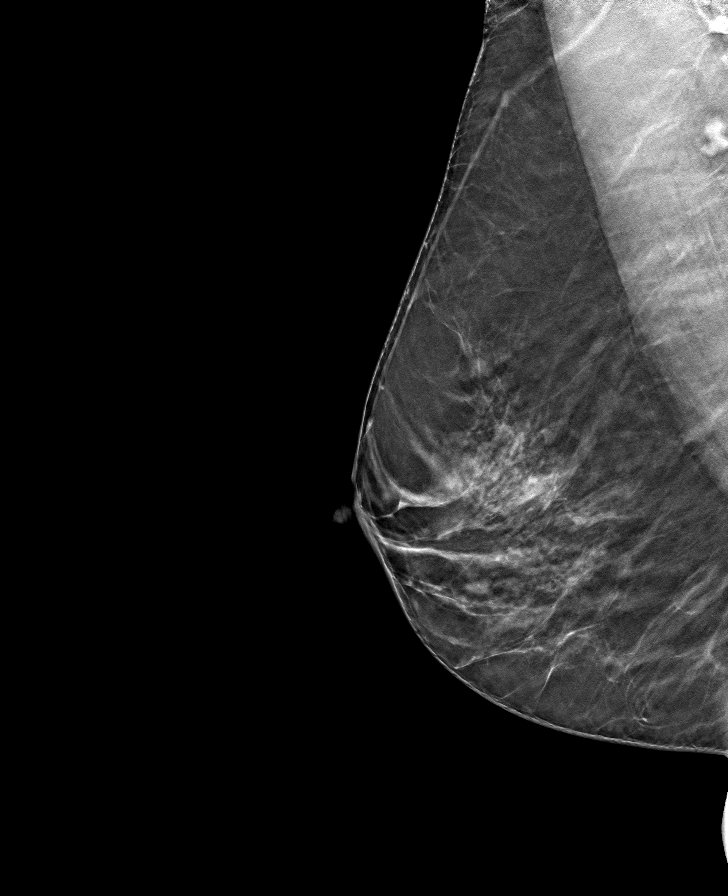

[R CC tomo · tomo slice 33/65.0]
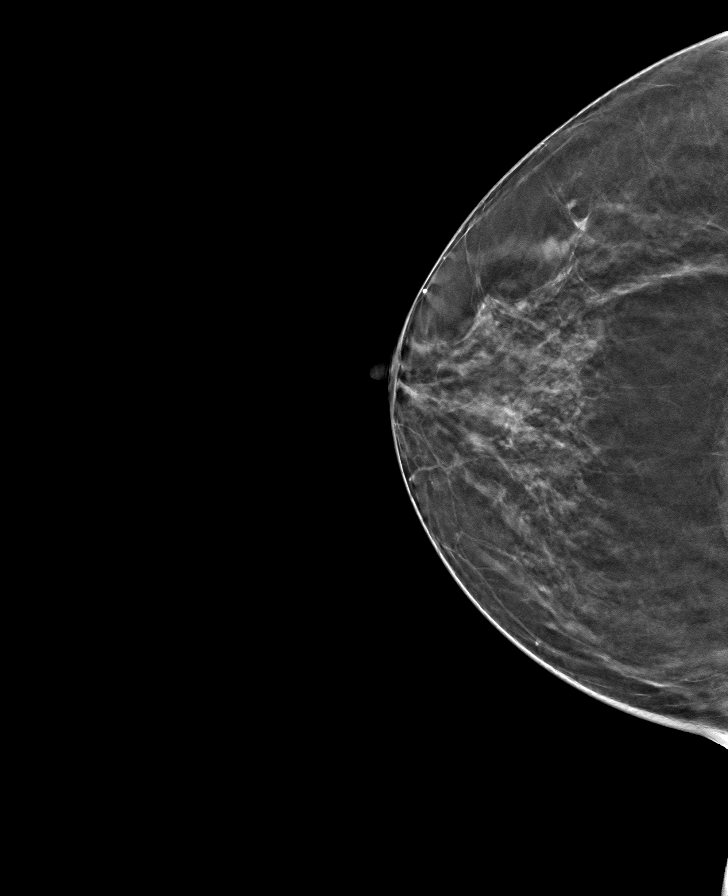

[8 of 24 positions shown; findings below may reference images not displayed]

IMPRESSION: There is no mammographic evidence of malignancy.
Screening mammogram recommended in 1 year.
BI-RADS Category 1: Negative

## 2019-03-06 IMAGING — MG MAMMO SCRN BIL W/CAD TOMO
6 of 11 series · 6 of 31 positions shown · non-contrast
Comparison: The present examination has been compared to prior imaging studies.

Images Obtained from [facility]
INDICATION: Screening.
TECHNIQUE: Bilateral 2-D digital screening mammogram was performed followed by 3-D tomosynthesis.  Current study was also evaluated with a computer aided detection (CAD) system.
MAMMOGRAM FINDINGS:
There are scattered areas of fibroglandular density.
No suspicious abnormality is seen in either breast.  There are no significant changes from the prior study.

[R MLO (1 of 3)]
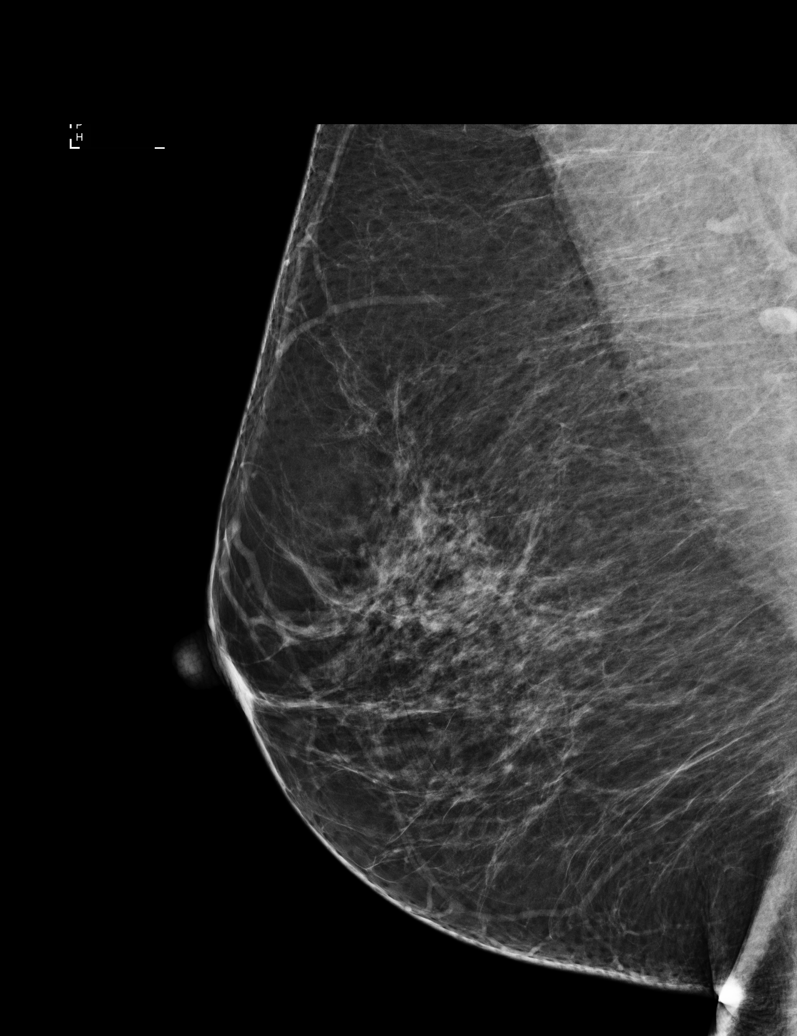

[R CC]
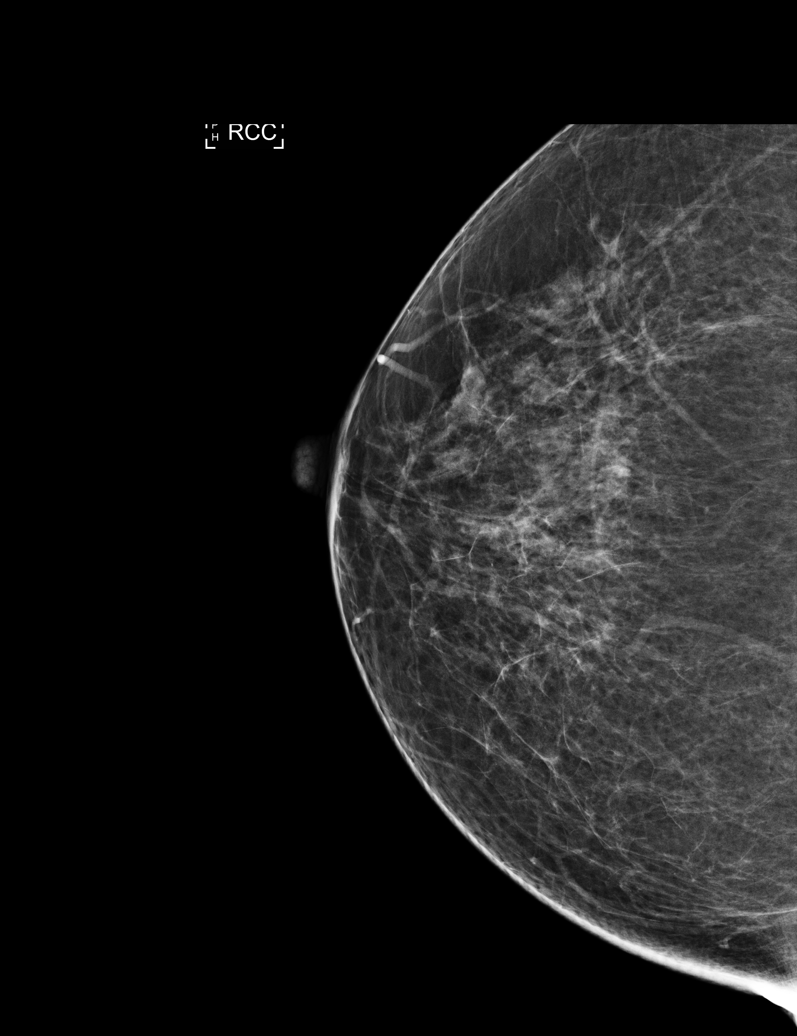

[R MLO (2 of 3)]
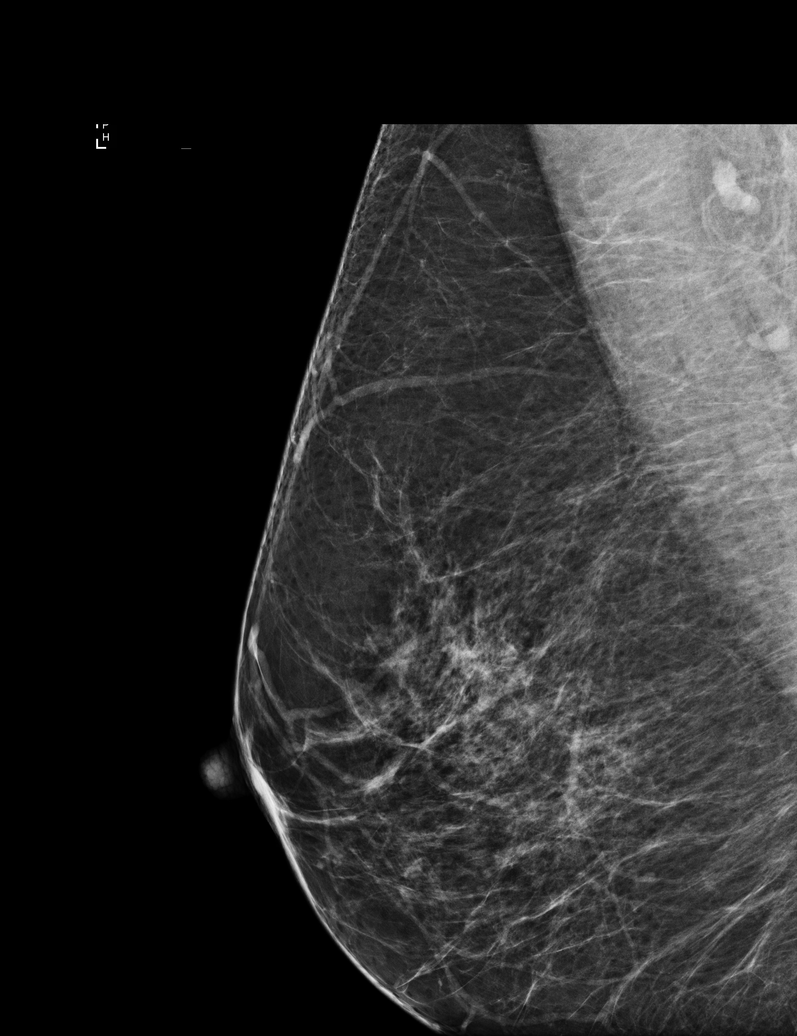

[R MLO (3 of 3)]
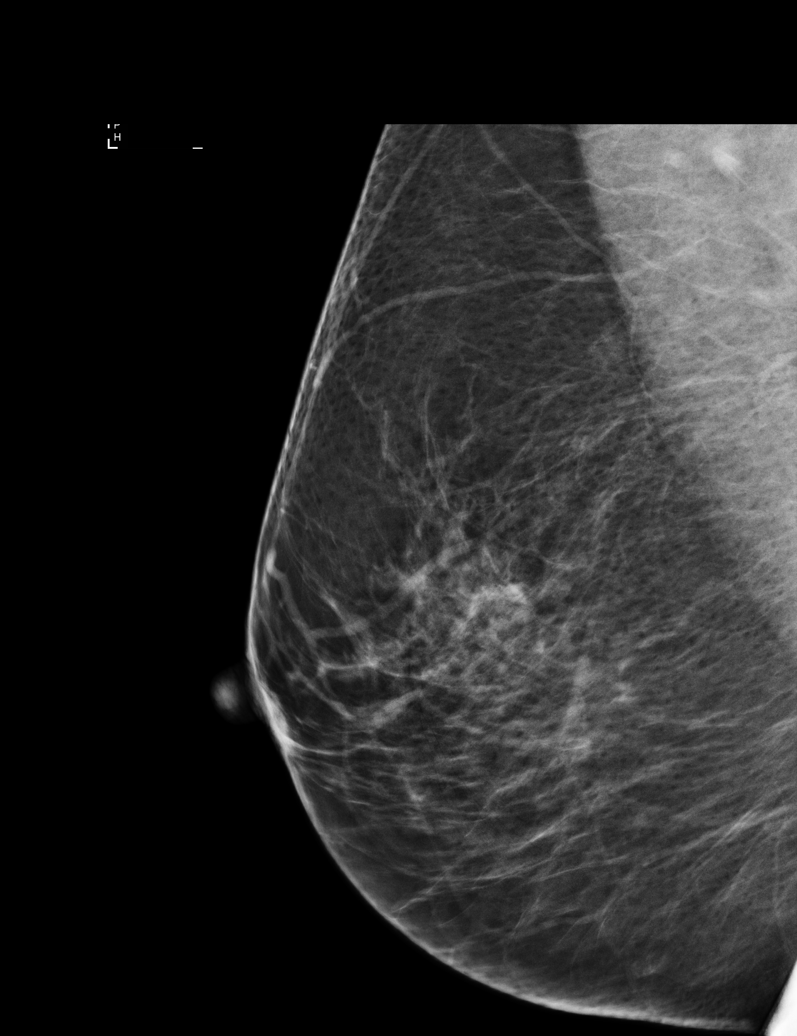

[L CC]
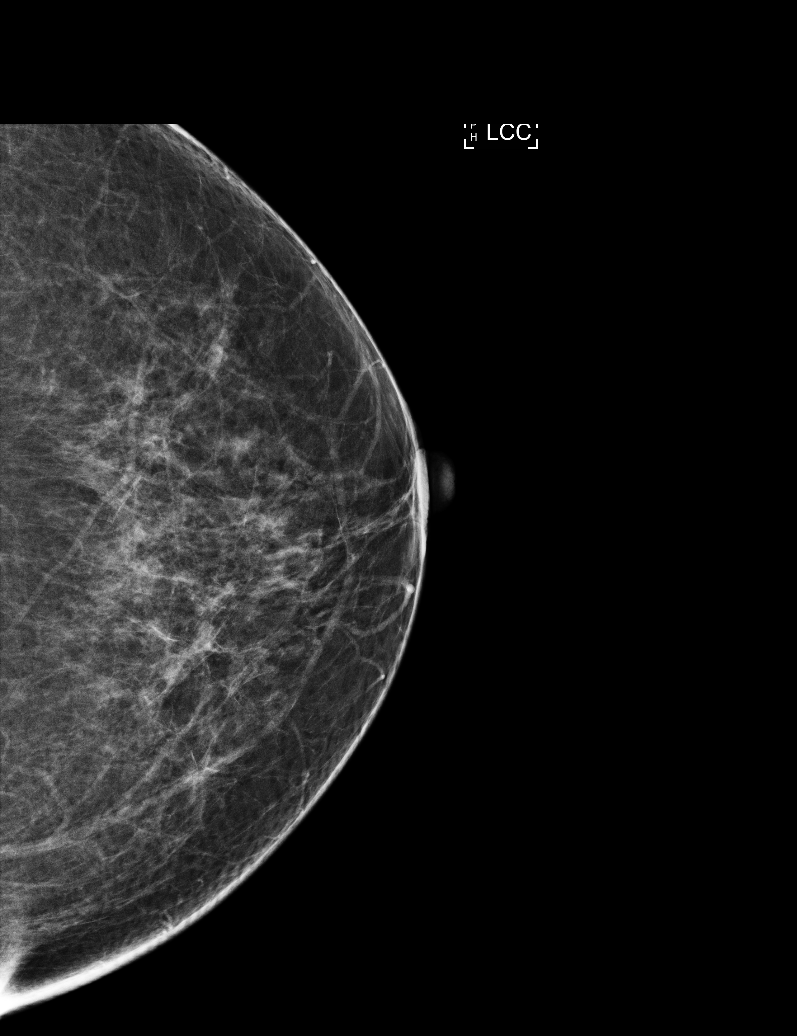

[L MLO]
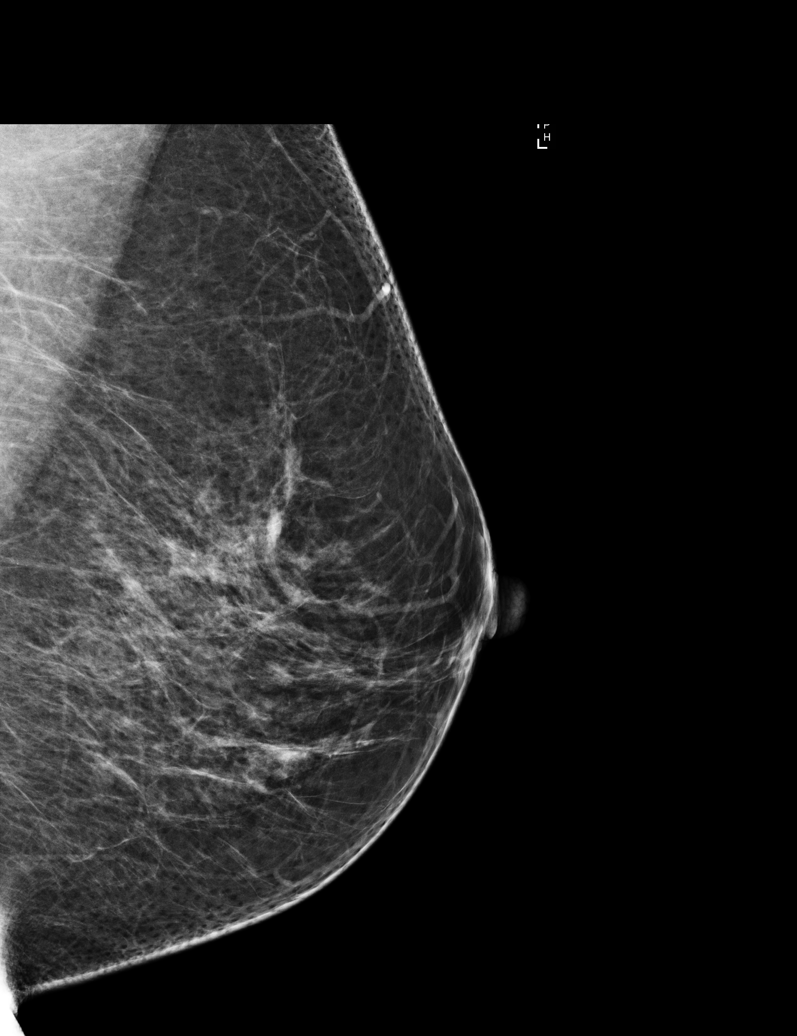

[6 of 31 positions shown; findings below may reference images not displayed]

IMPRESSION: There is no mammographic evidence of malignancy.
Screening mammogram recommended in 1 year.
BI-RADS Category 1: Negative

## 2019-03-25 IMAGING — CR SHOULDER LT 2-3 VWS
1 series · 3 of 3 positions shown · non-contrast
Comparison: None

Left shoulder radiographs, 3 views
INDICATION: Left shoulder pain

[Series 1: internal rotate · 0.17mm/px · 3 of 3 slices shown]
[im 1/3]
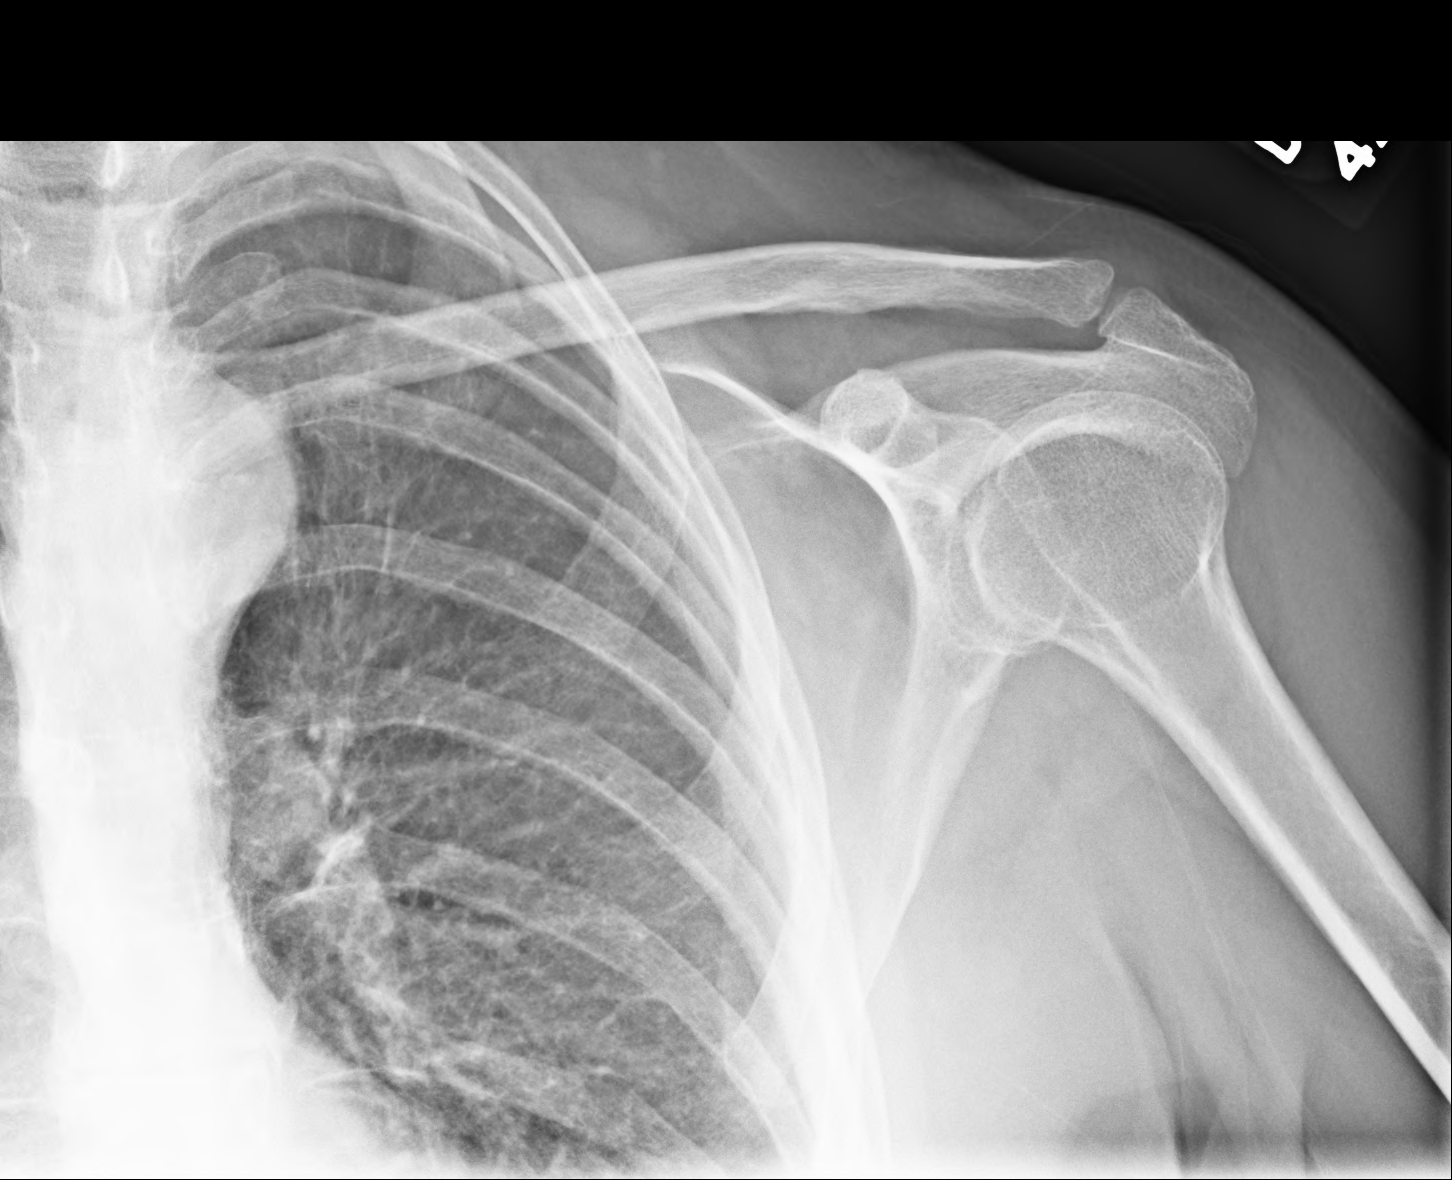
[im 2/3]
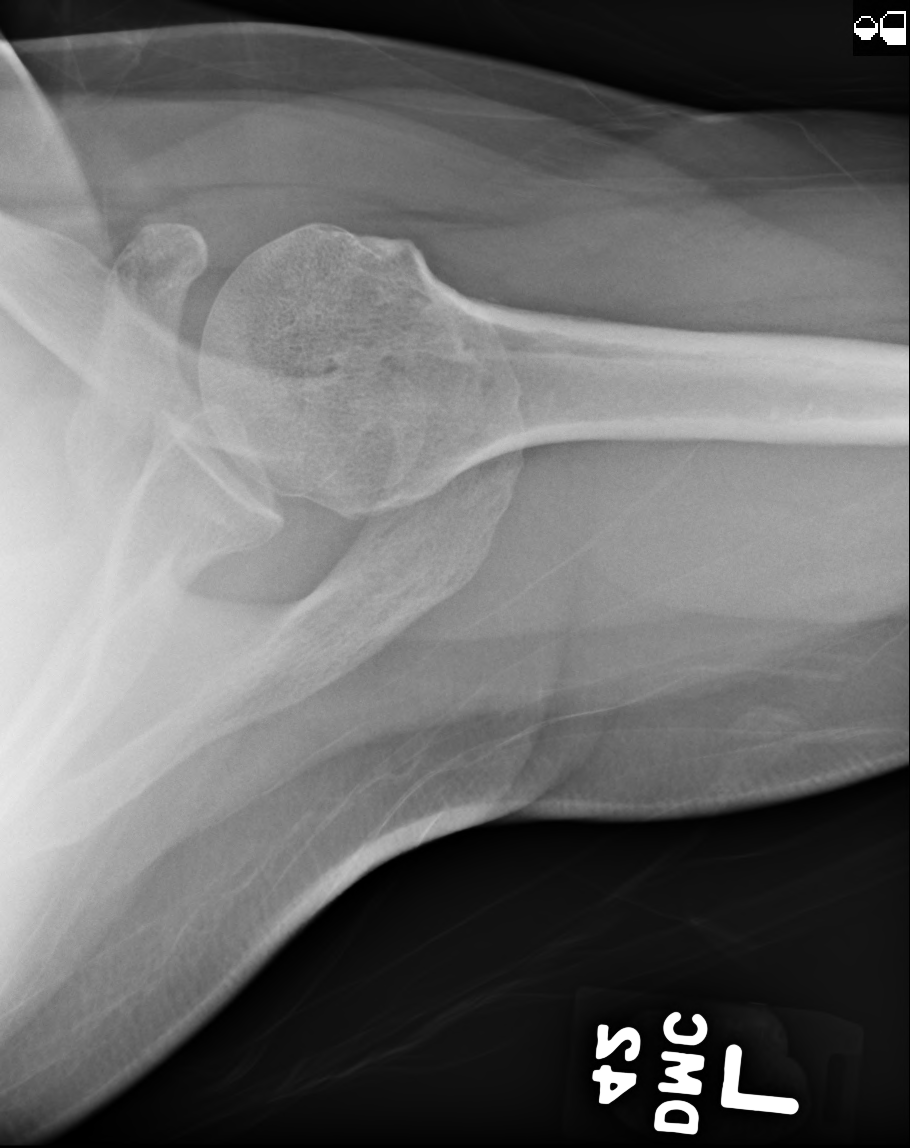
[im 3/3]
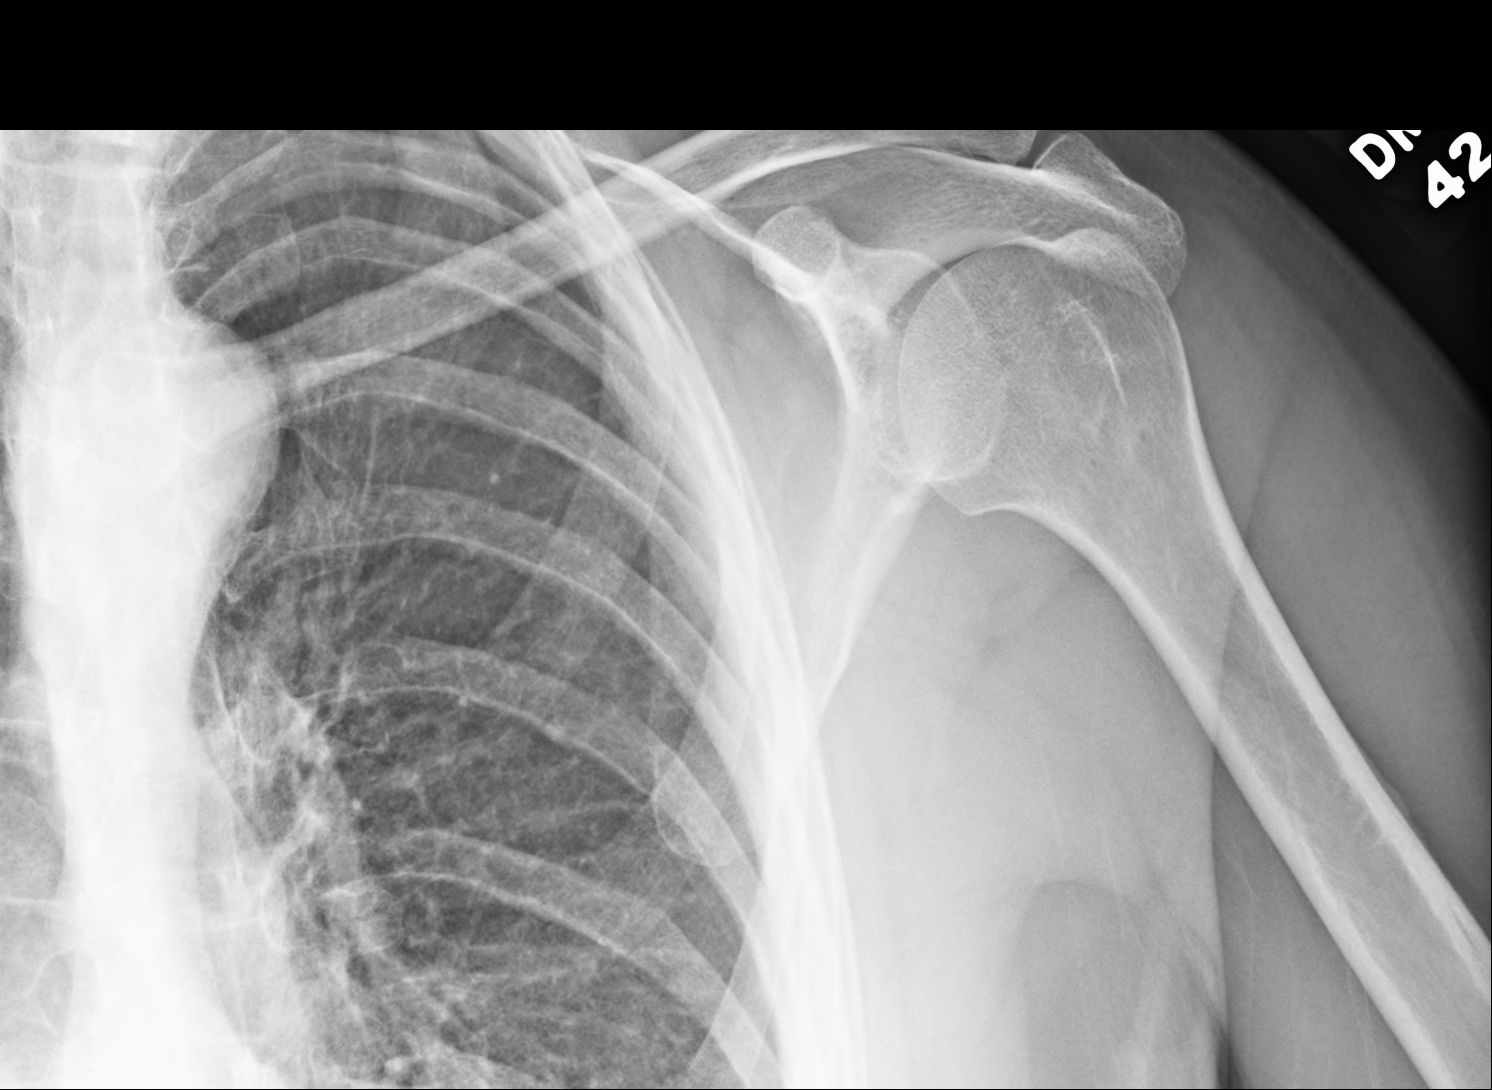

[3 of 3 positions shown; findings below may reference images not displayed]

FINDINGS: No acute fracture. No dislocation. Joint spaces are intact. Soft tissues are unremarkable.
IMPRESSION: Unremarkable left shoulder radiographs.

## 2019-03-25 IMAGING — CR HIP LT 2 VW
1 series · 2 of 2 positions shown · non-contrast
Comparison: None

Left hip radiographs, 2 views
INDICATION: Left hip pain

[Series 1: frog lat · 0.17mm/px · 2 of 2 slices shown]
[im 1/2]
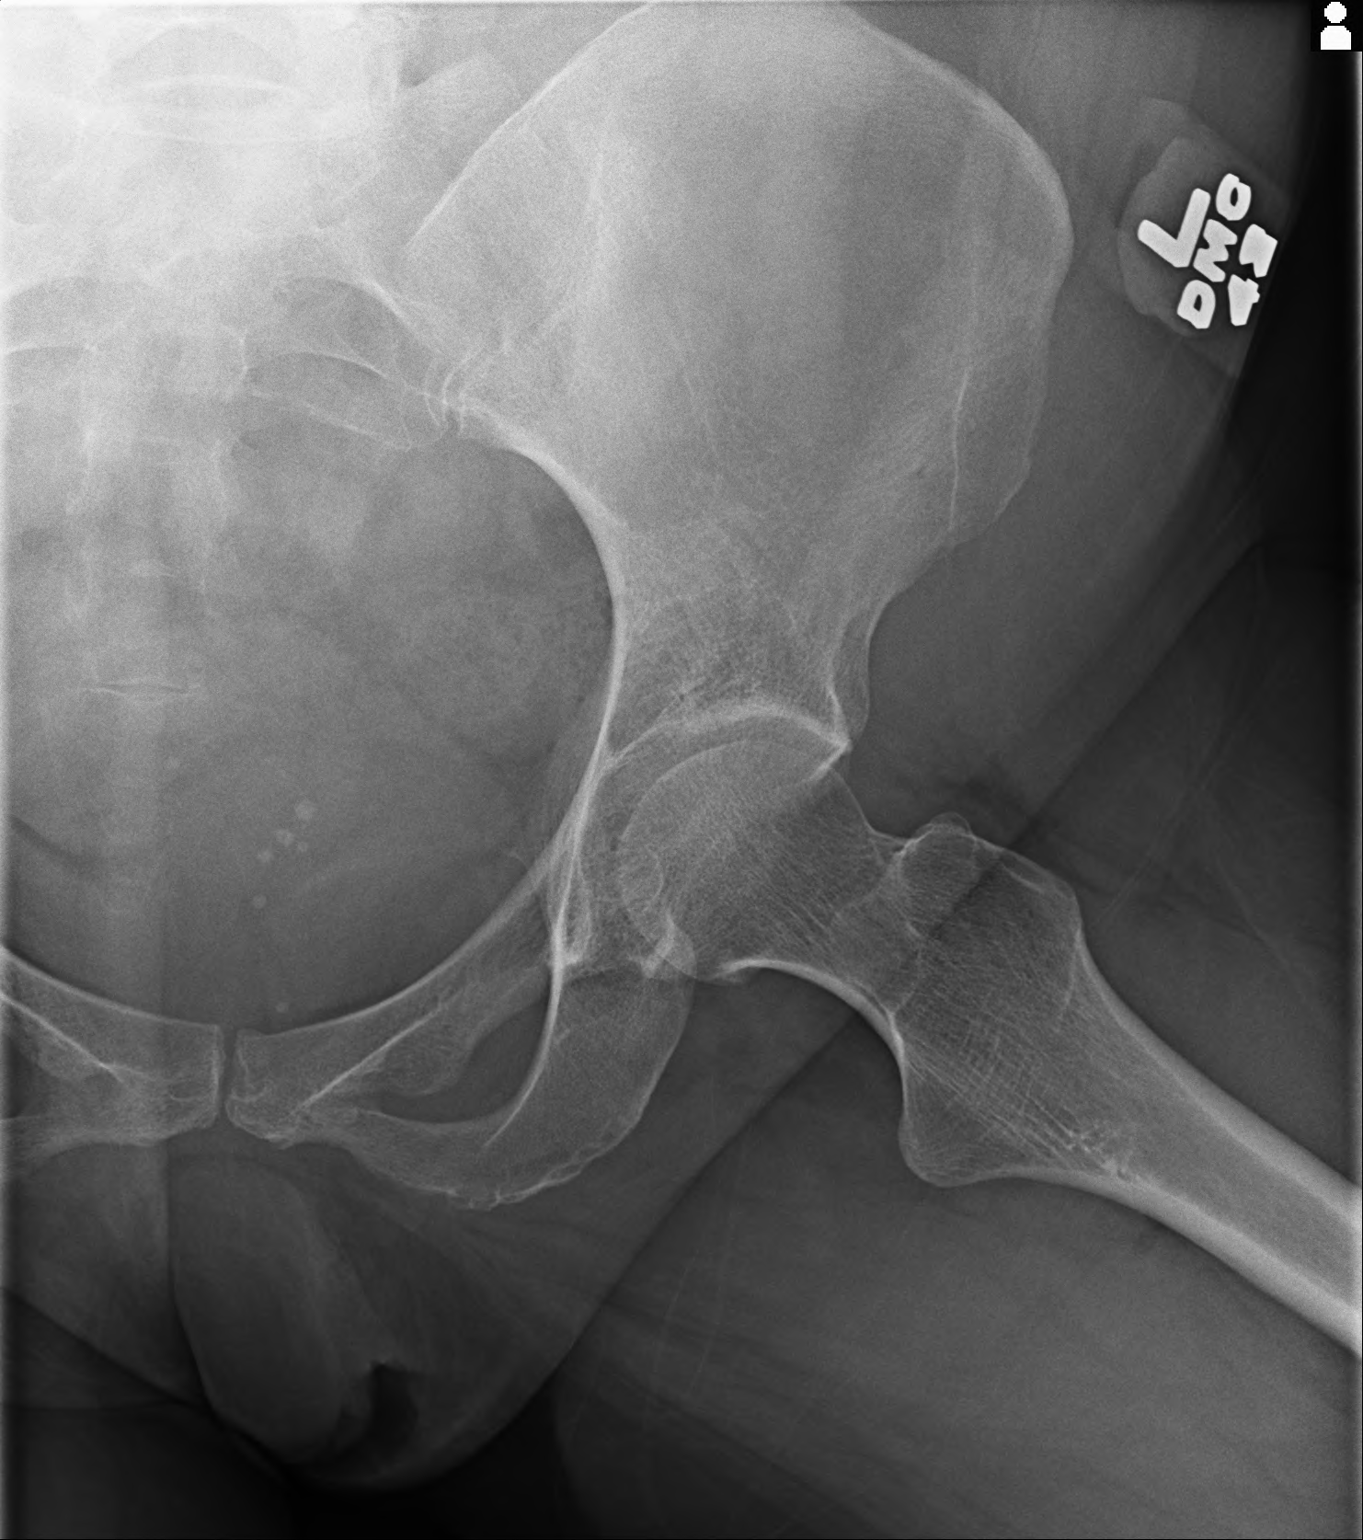
[im 2/2]
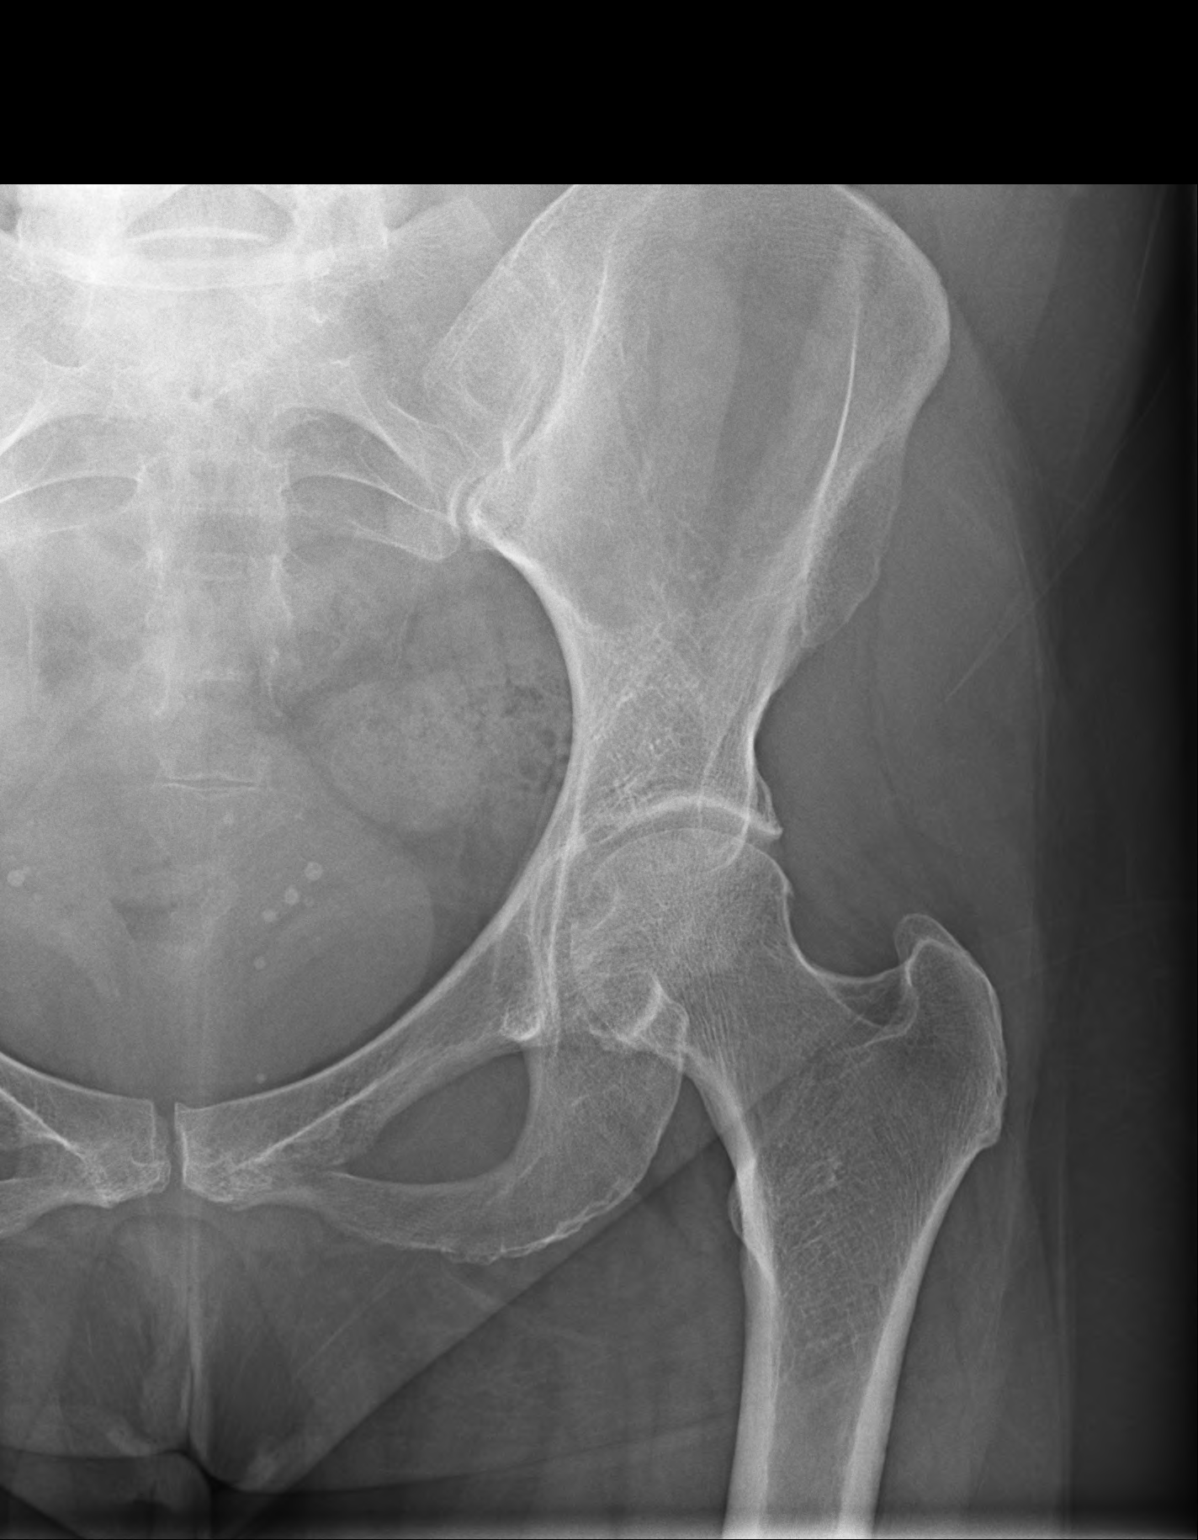

[2 of 2 positions shown; findings below may reference images not displayed]

FINDINGS: No acute fracture or dislocation. Hip joint space is intact. Soft tissues are unremarkable. Lower lumbar spine degenerative changes.
IMPRESSION: 1. Intact left hip joint.

2. Lower lumbar spine degenerative changes, partially imaged.

## 2020-03-19 IMAGING — MG MAMMO SCRN BIL W/CAD TOMO
8 series · 8 of 24 positions shown · non-contrast
Comparison: The present examination has been compared to prior imaging studies.

Images Obtained from [facility]
INDICATION: Screening.
TECHNIQUE: Bilateral 2-D digital screening mammogram was performed followed by 3-D tomosynthesis.  Current study was also evaluated with a computer aided detection (CAD) system.
MAMMOGRAM FINDINGS:
There are scattered areas of fibroglandular density.
No suspicious abnormality is seen in either breast.  There are no significant changes from the prior study.

[R CC]
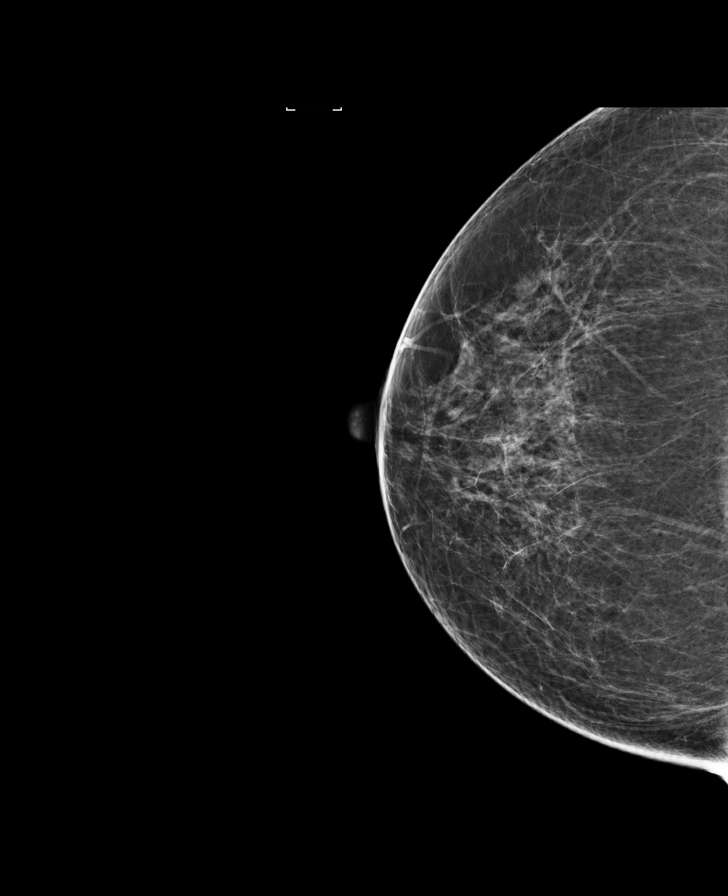

[L CC]
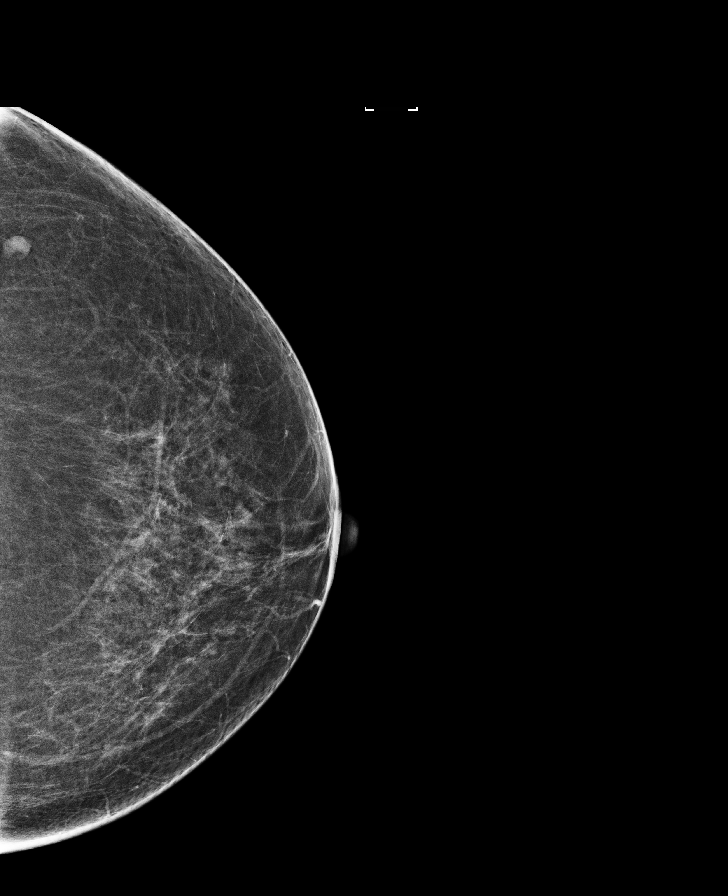

[L MLO]
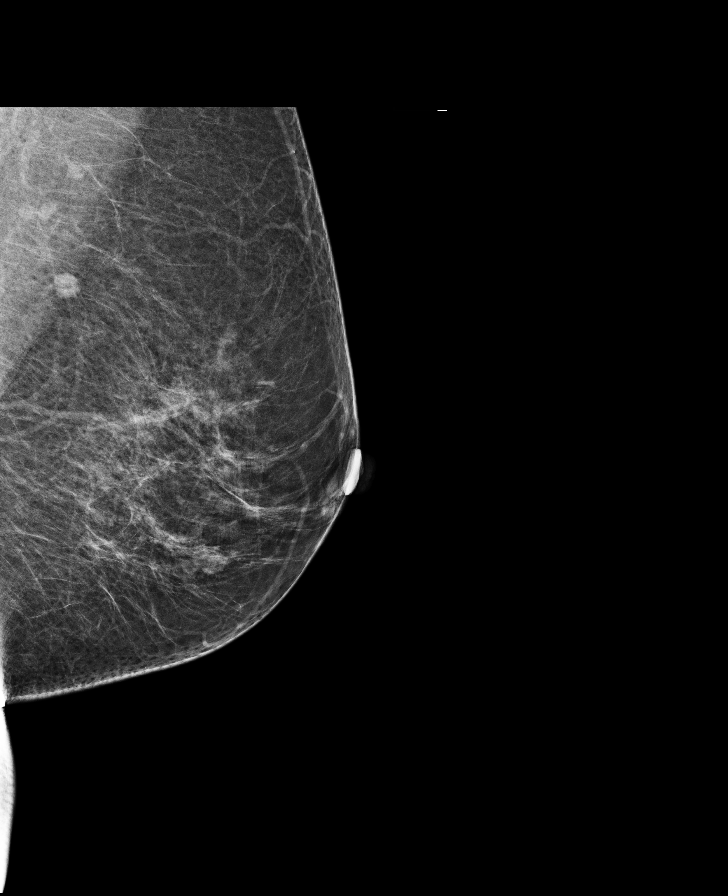

[R MLO]
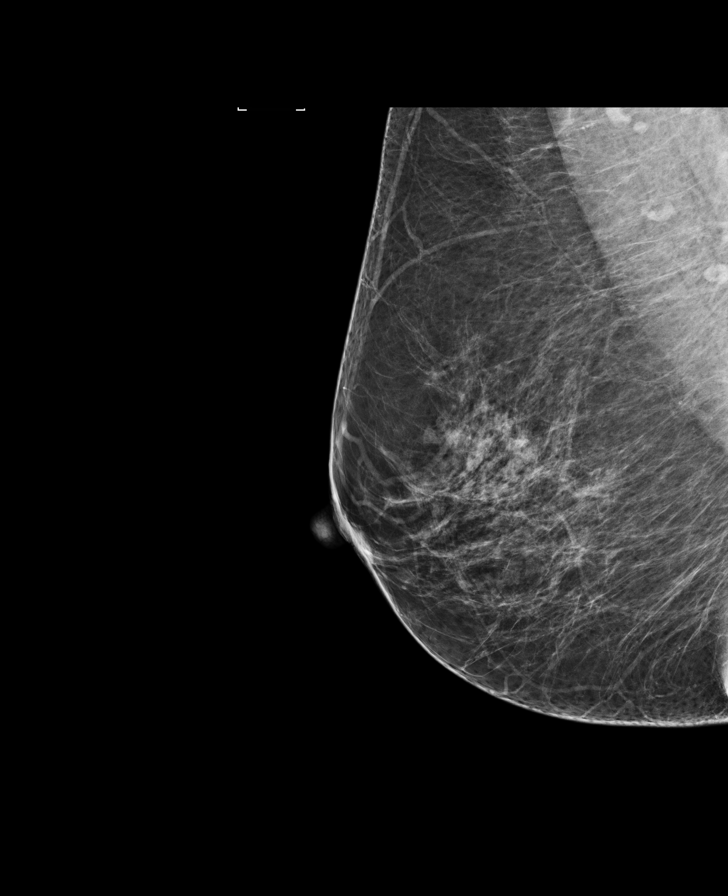

[R CC tomo · tomo slice 33/65.0]
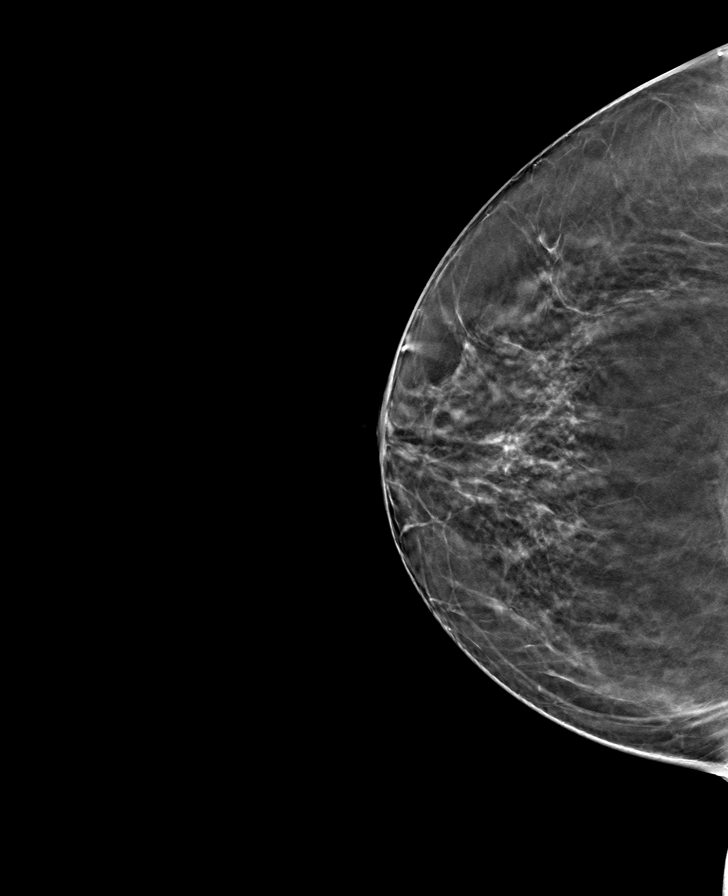

[R MLO tomo · tomo slice 35/69.0]
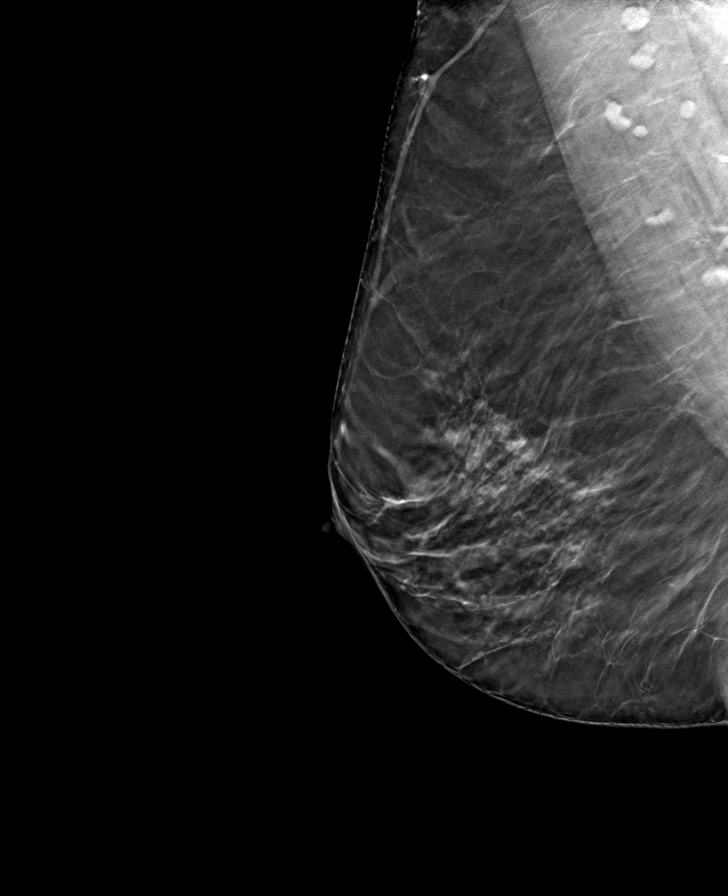

[L CC tomo · tomo slice 33/66.0]
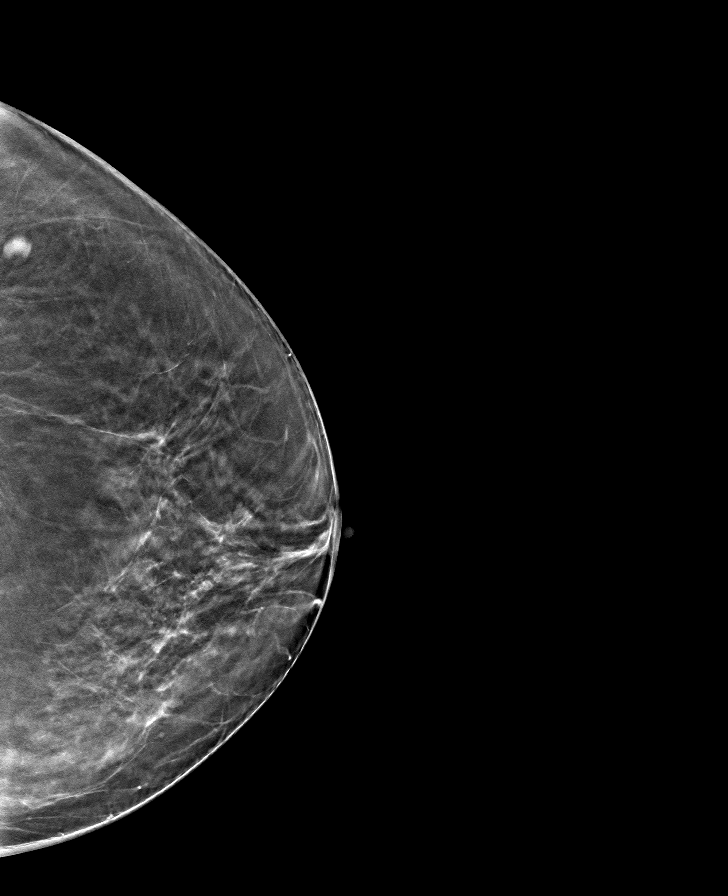

[L MLO tomo · tomo slice 37/73.0]
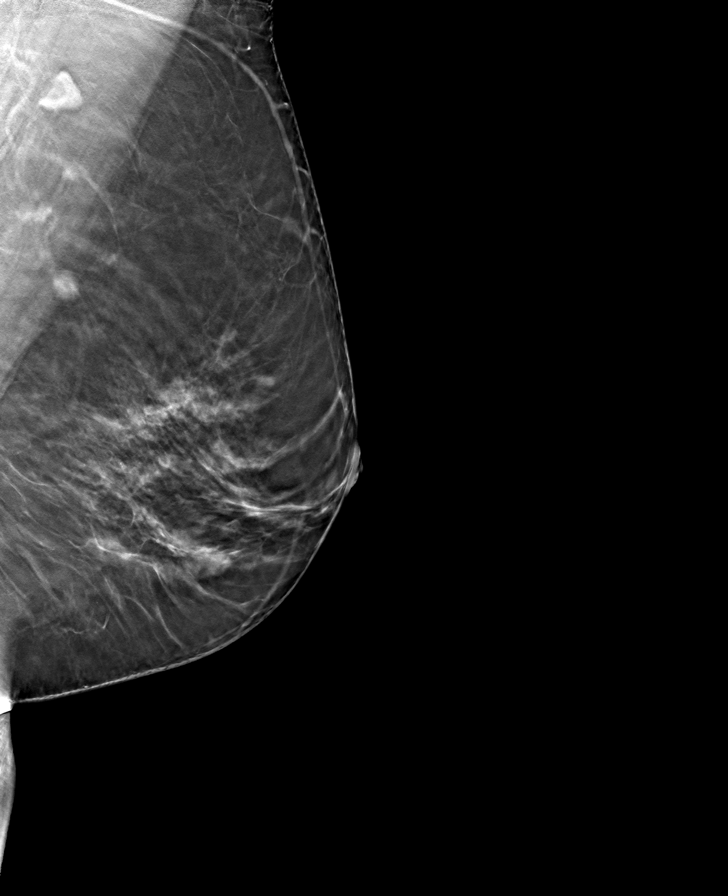

[8 of 24 positions shown; findings below may reference images not displayed]

IMPRESSION: There is no mammographic evidence of malignancy.
Screening mammogram recommended in 1 year.
BI-RADS Category 1: Negative

## 2020-07-29 IMAGING — CR SHOULDER LT 2-3 VWS
1 series · 3 of 3 positions shown · non-contrast
Comparison: 03/25/19

Left shoulder 3 views

INDICATIONS:  Left shoulder pain.

[Series 1: internal rotate · 0.17mm/px · 3 of 3 slices shown]
[im 1/3]
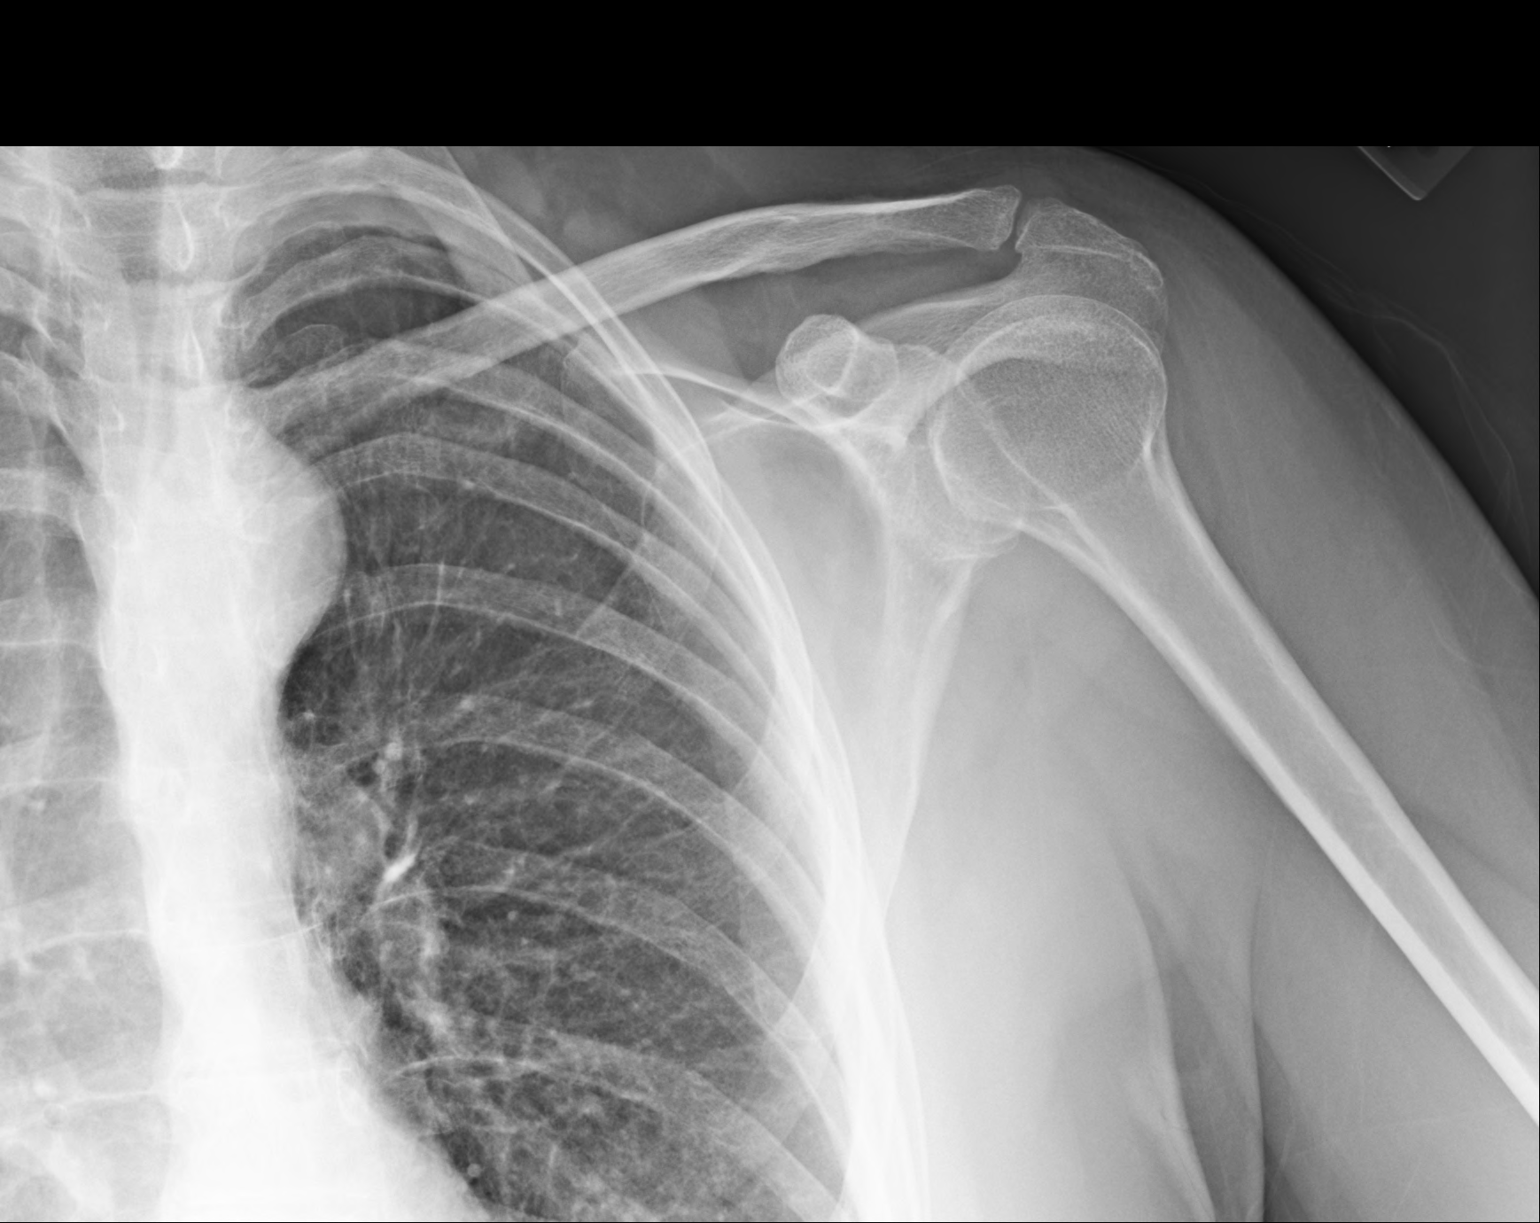
[im 2/3]
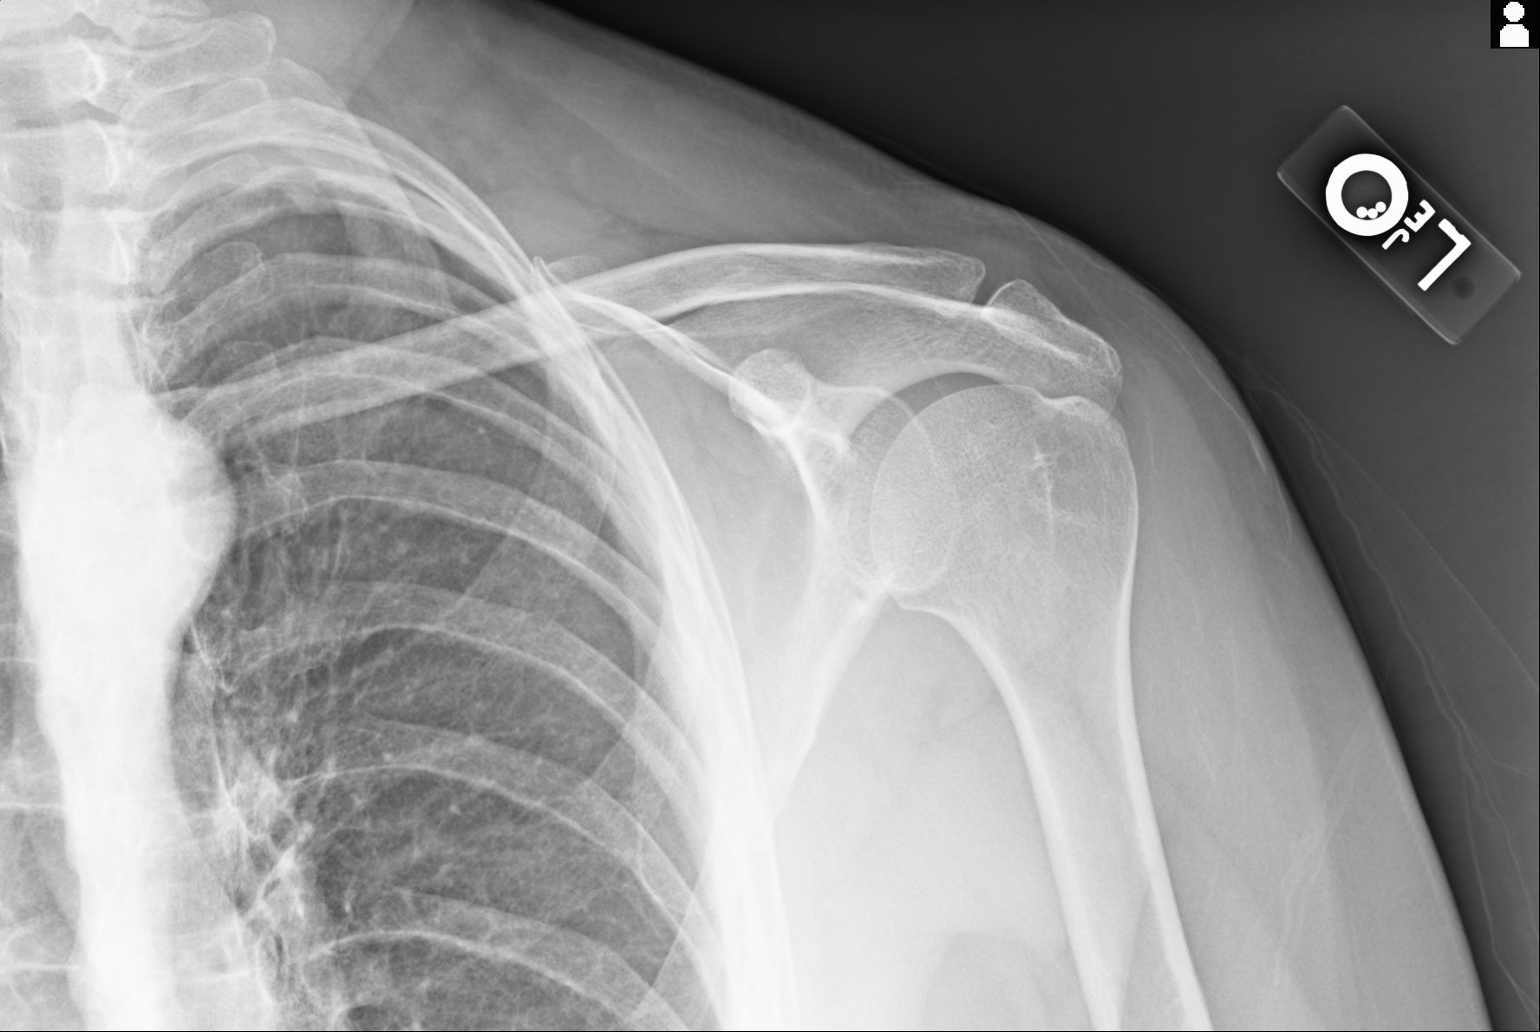
[im 3/3]
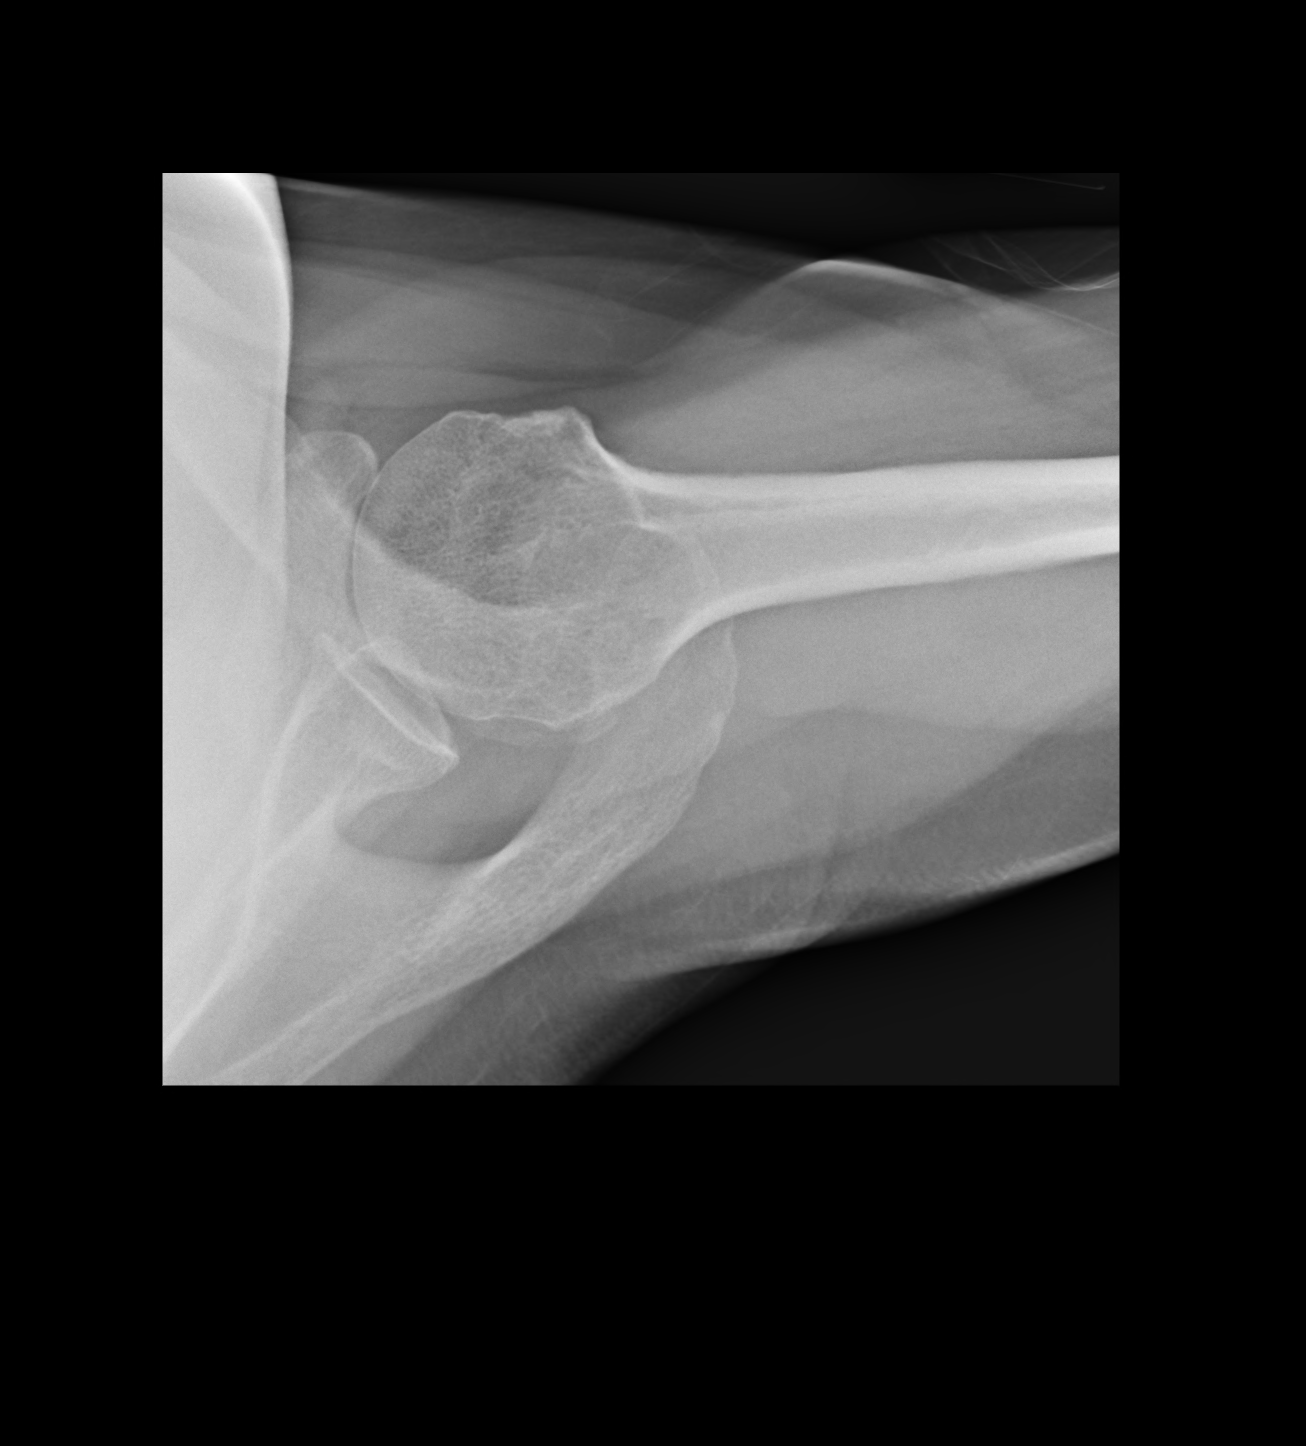

[3 of 3 positions shown; findings below may reference images not displayed]

FINDINGS: No bony or articular abnormality is identified. In particular, there is no evidence of fracture, dislocation, or radiopaque foreign body.
IMPRESSION: Normal left shoulder.

## 2020-07-29 IMAGING — CR HIP LT 2 VW
1 series · 2 of 2 positions shown · non-contrast
Comparison: None.

Left hip 2 views

INDICATIONS:  Pain to left hip.

[Series 1: lat · 0.17mm/px · 2 of 2 slices shown]
[im 1/2]
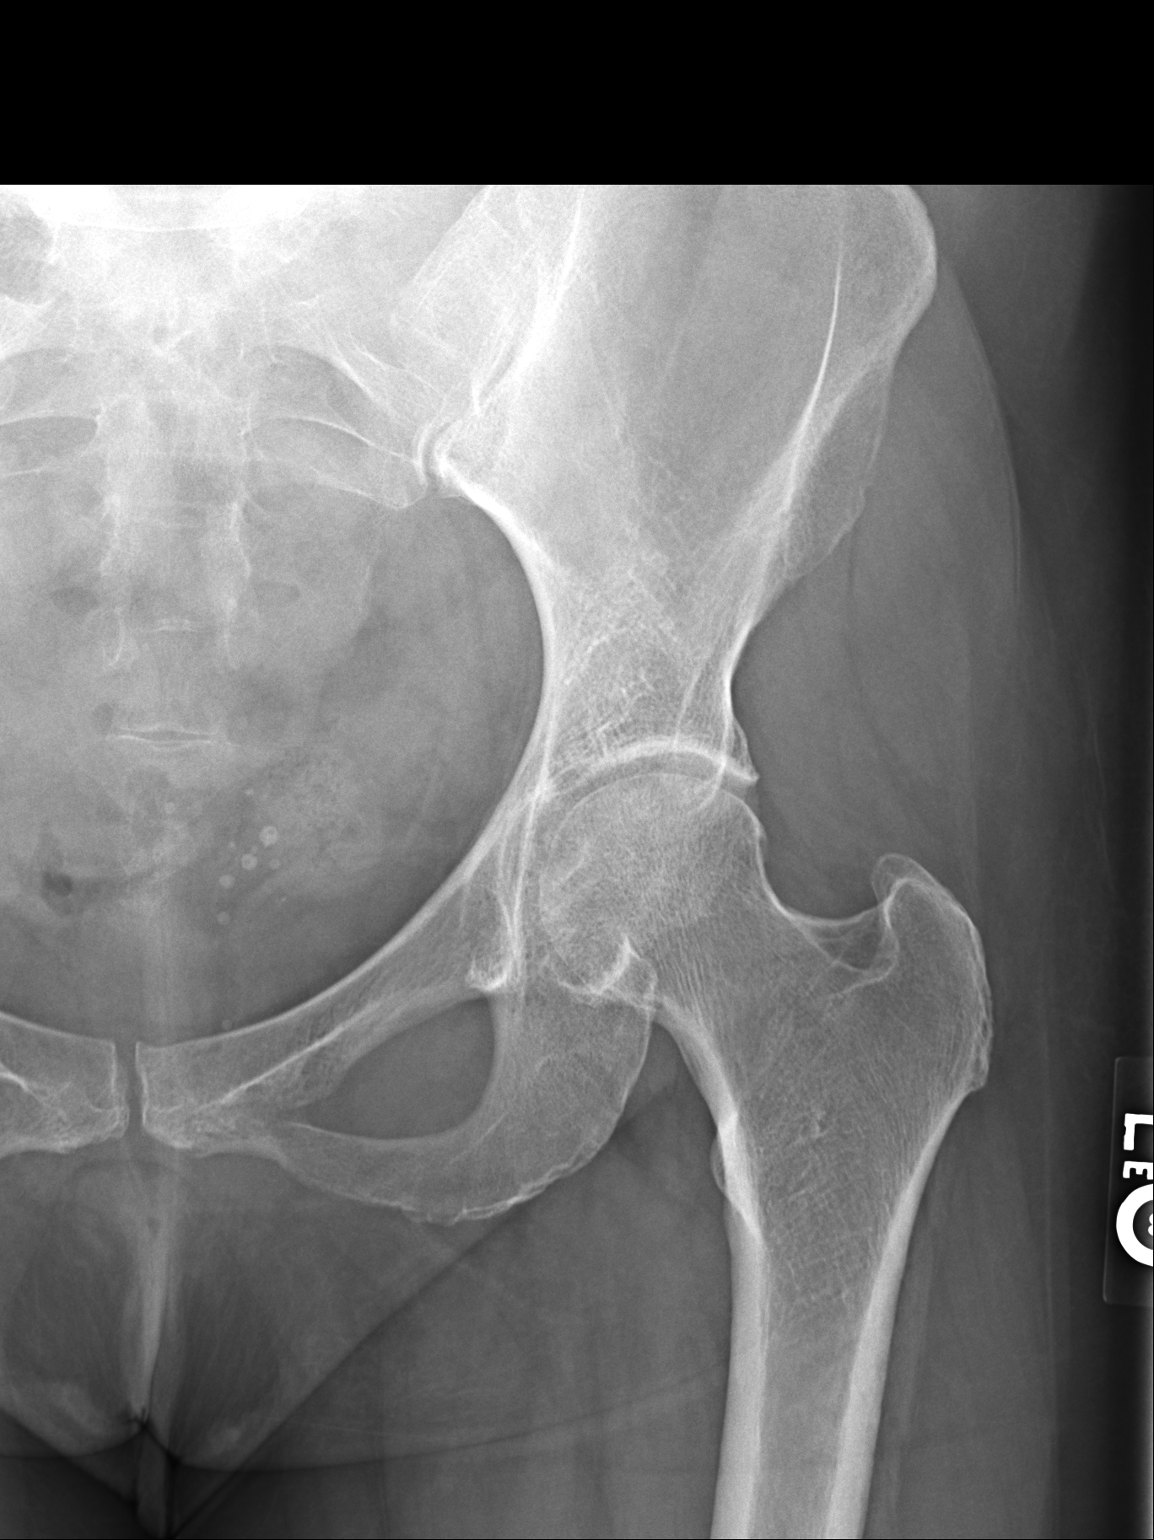
[im 2/2]
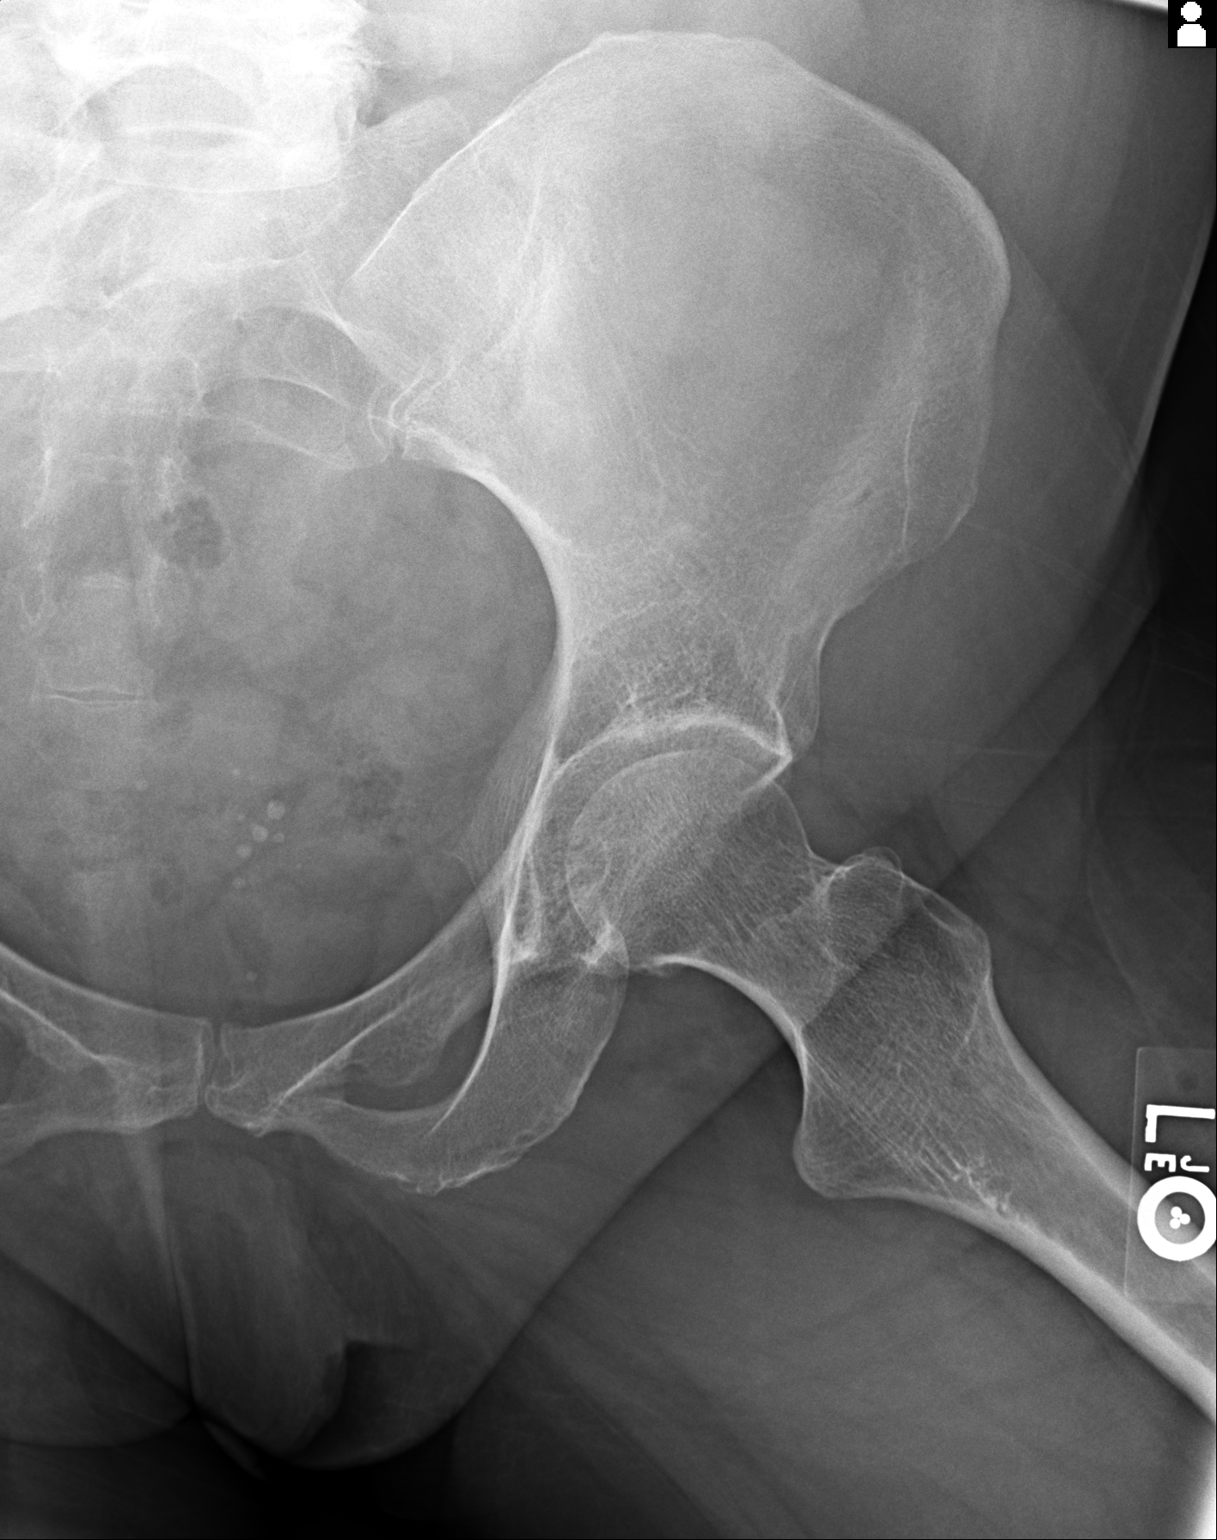

[2 of 2 positions shown; findings below may reference images not displayed]

FINDINGS: No bony or articular abnormality is identified. In particular, there is no evidence of fracture, dislocation, or radiopaque foreign body.
IMPRESSION: Normal left hip.

## 2020-07-29 IMAGING — MR MRI HIP LT WO CONTRAST
4 of 5 series · 25 of 40 positions shown · non-contrast
Comparison: Left hip radiographs, 07/29/2020.

HISTORY: Hip pain
TECHNIQUE: Multiplanar and multisequence MR imaging of the left hip was performed without the administration of intravenous contrast.

[Series 2: t1_cor · coronal · left · 4.0mm · 0.66mm/px · 6 of 24 slices shown]
[im 1/24]
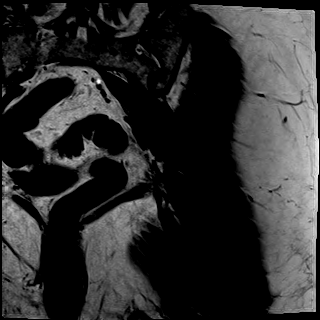
[im 5/24]
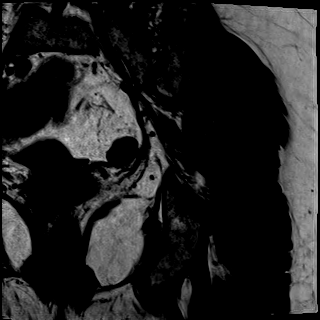
[im 10/24]
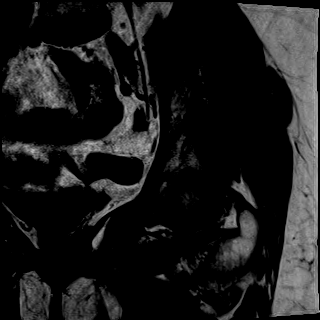
[im 14/24]
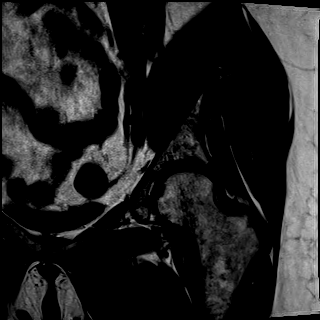
[im 19/24]
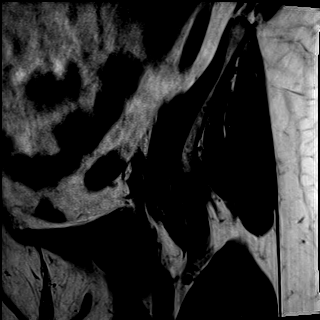
[im 24/24]
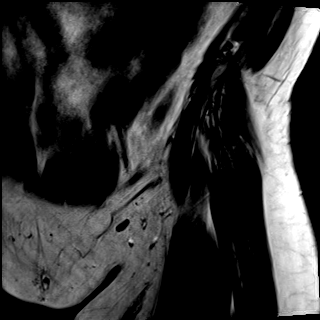

[Series 3: t2_cor_fs · coronal · left · 4.0mm · 0.73mm/px · 6 of 24 slices shown]
[im 1/24]
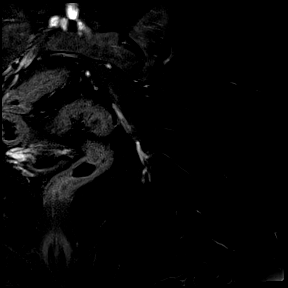
[im 5/24]
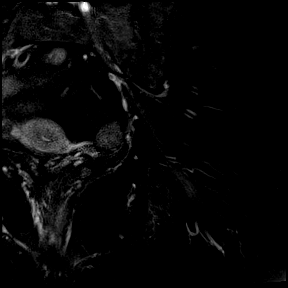
[im 10/24]
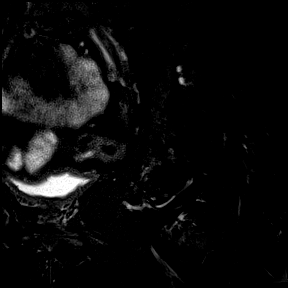
[im 14/24]
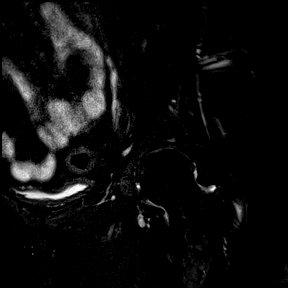
[im 19/24]
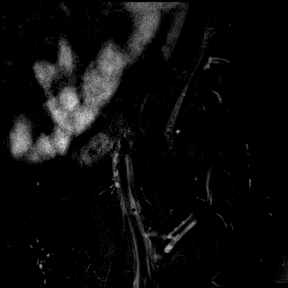
[im 24/24]
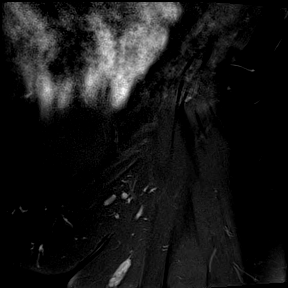

[Series 4: t1_axial · axial · left · 4.0mm · 0.35mm/px · z∈[-51,+124]mm · 10 of 40 slices shown]
[im 1/40]
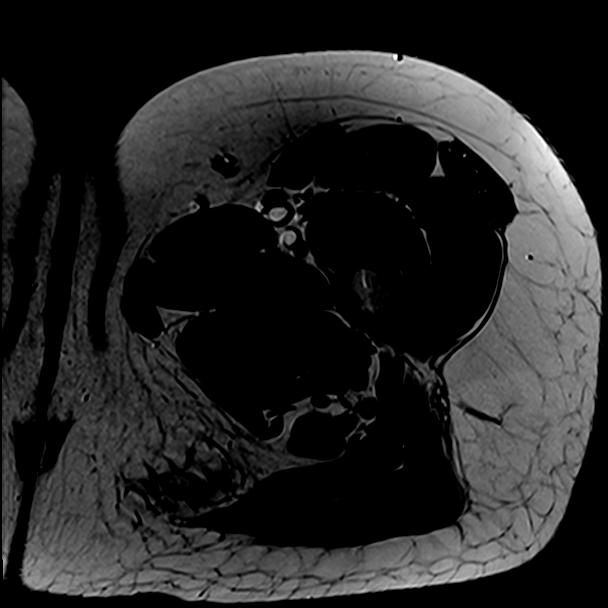
[im 4/40]
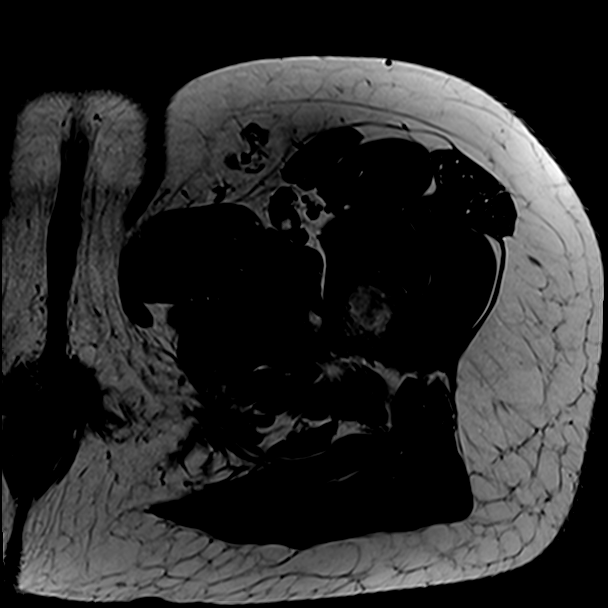
[im 8/40]
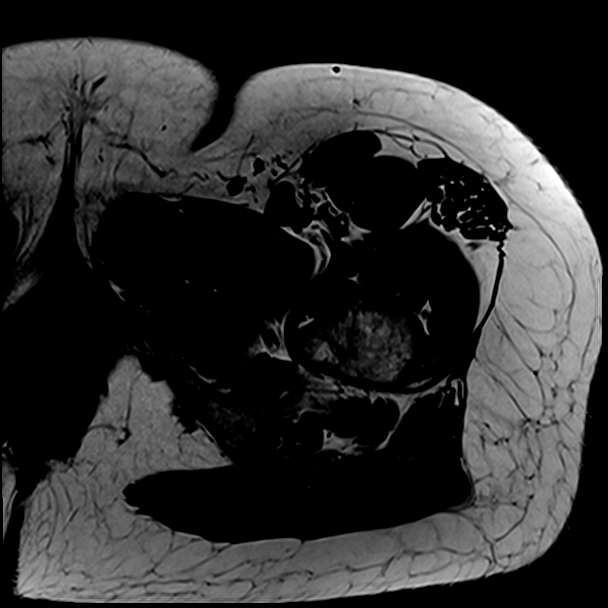
[im 12/40]
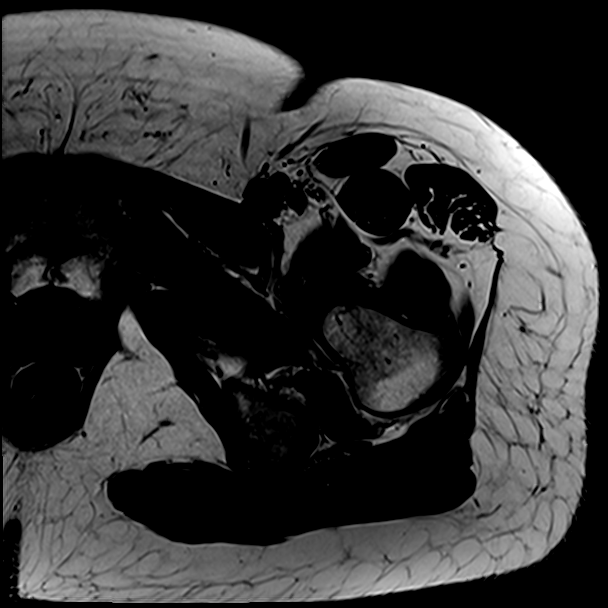
[im 16/40]
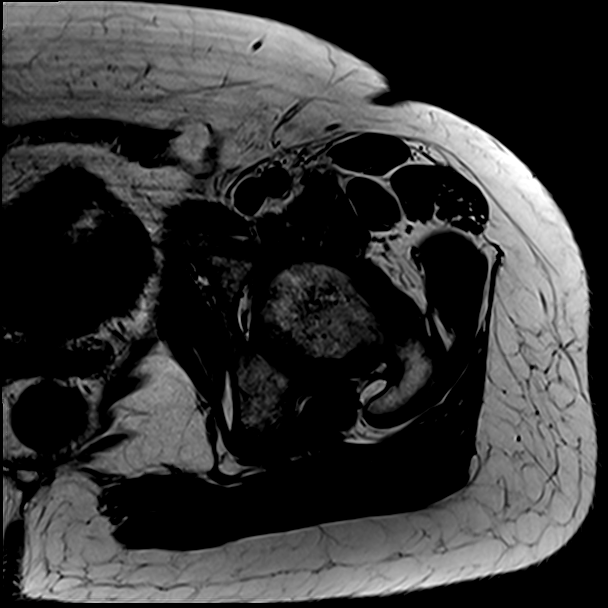
[im 20/40]
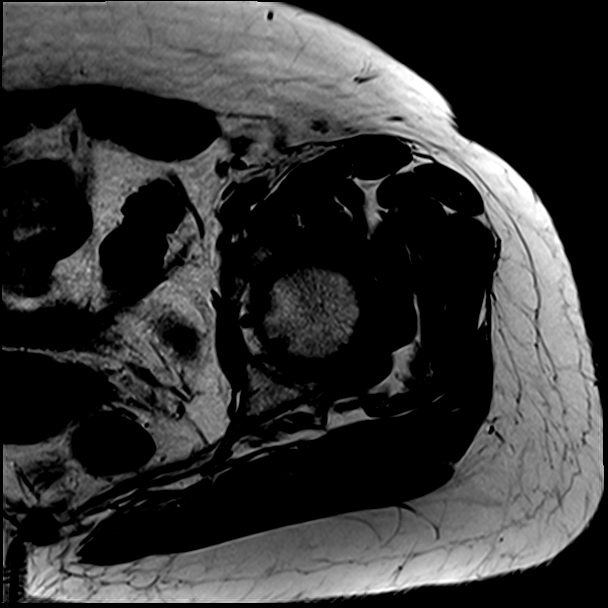
[im 24/40]
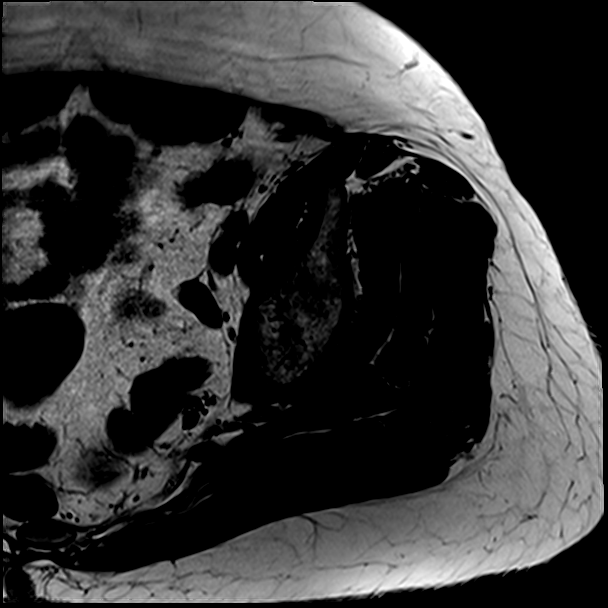
[im 28/40]
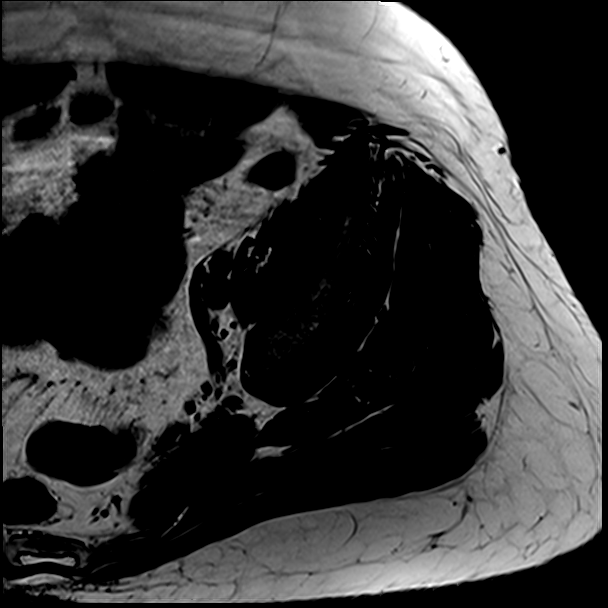
[im 32/40]
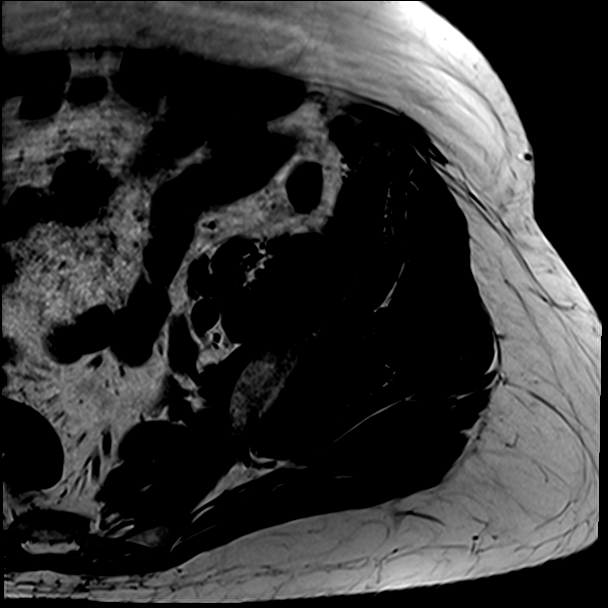
[im 36/40]
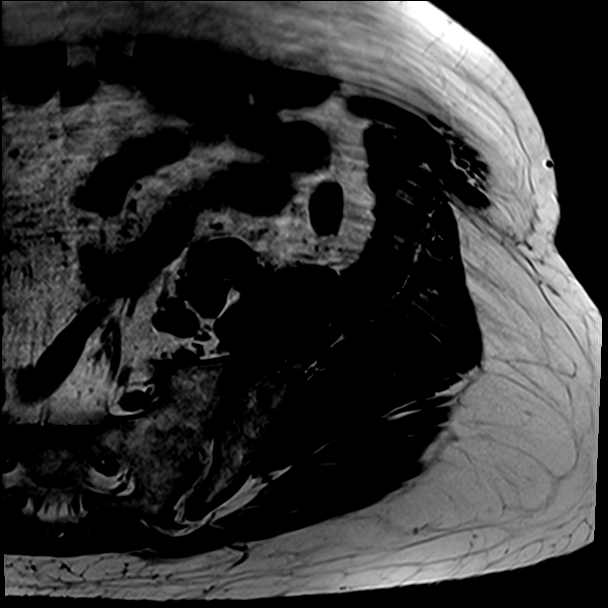

[Series 5: t2_axial_fs · axial · left · 4.0mm · 0.77mm/px · z∈[-36,+124]mm · 3 of 40 slices shown]
[im 4/40]
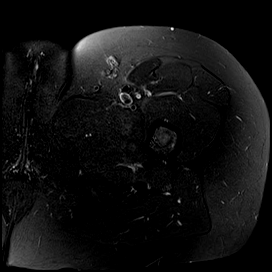
[im 20/40]
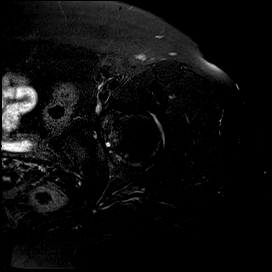
[im 36/40]
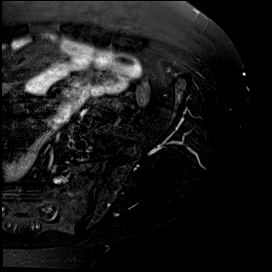

[25 of 40 positions shown; findings below may reference images not displayed]

FINDINGS: Osseous: No acute fracture, avascular necrosis or aggressive osseous lesion.

Left sacroiliac joint is unremarkable in appearance. The pubic symphysis is unremarkable in appearance.

Left hip joint: The acetabular labrum is intact. Mild chondral thinning and irregularity of the superior femoroacetabular articular cartilage without underlying reactive osseous change. No hip joint effusion. The ligamentum teres is intact.

Gluteal tendons and greater trochanteric bursa: Mild gluteus minimus tendinosis with moderate peritendinous edema near the tendon insertion. The gluteus medius tendon is intact with mild peritendinous edema. Small volume fluid within the greater trochanteric bursa.

Other musculotendinous structures: The iliopsoas, rectus femoris and proximal hamstring tendon origins are intact. No intramuscular edema. No focal fatty atrophy.

Left sciatic nerve: Visualized course and caliber of the left sciatic nerve is unremarkable in appearance.

Other: The visualized intrapelvic contents are unremarkable.
IMPRESSION: 1.
No acute fracture or stress reaction of the left hip.

2.
Mild left hip chondrosis. Intact acetabular labrum.

3.
Mild gluteus medius tendinosis, with moderate peritendinous edema near the insertion.

4.
Small volume of fluid within the greater trochanteric bursa. Correlate with clinical signs of bursitis.

## 2020-07-29 IMAGING — MR MRI SHOULDER LT WO CONTRAST
4 series · 40 of 40 positions shown · non-contrast
Comparison: None available.

INDICATION: Shoulder pain.
TECHNIQUE: Multiplanar, multisequence MR imaging of the left shoulder without contrast.

[Series 5: t2_axial_fs_blade · axial · left · 3.0mm · 0.52mm/px · z∈[-39,+37]mm · 11 of 20 slices shown]
[im 1/20]
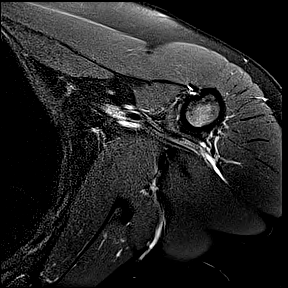
[im 2/20]
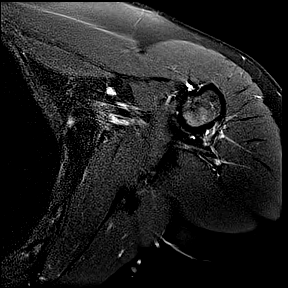
[im 4/20]
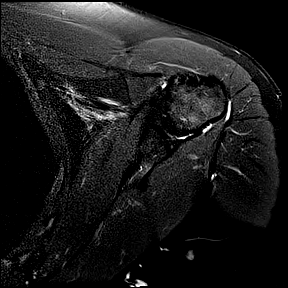
[im 6/20]
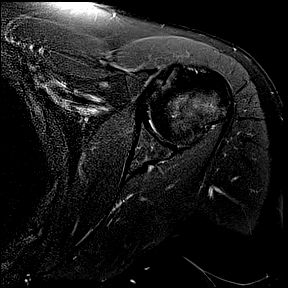
[im 8/20]
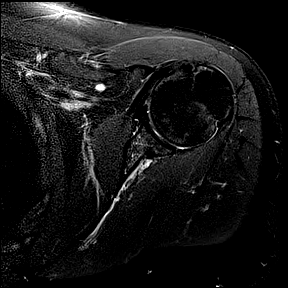
[im 10/20]
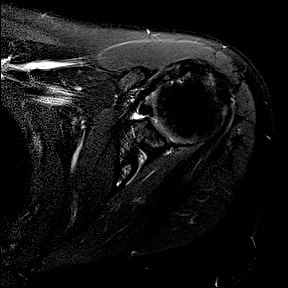
[im 12/20]
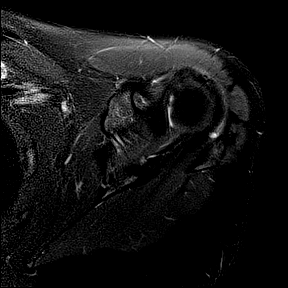
[im 14/20]
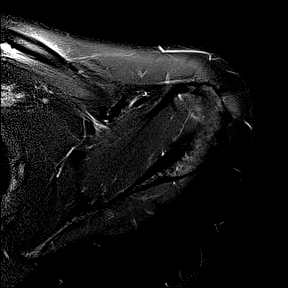
[im 16/20]
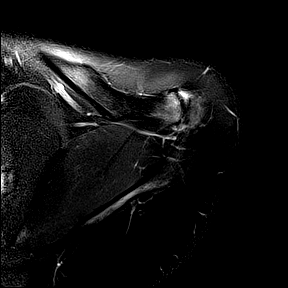
[im 18/20]
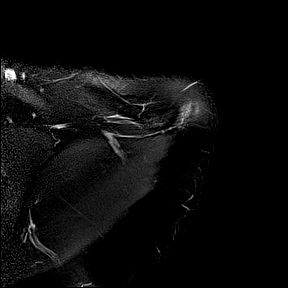
[im 20/20]
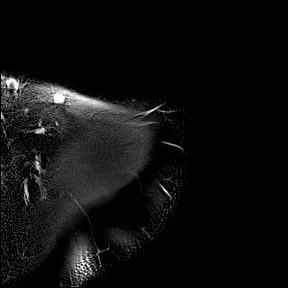

[Series 6: t2_cor_fs_blade · oblique · left · 3.0mm · 0.52mm/px · 9 of 18 slices shown]
[im 1/18]
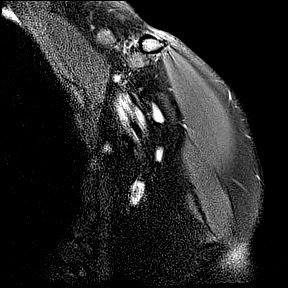
[im 3/18]
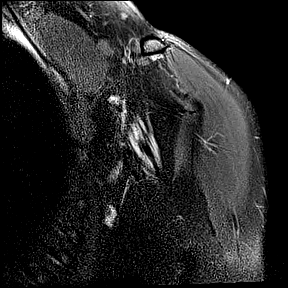
[im 5/18]
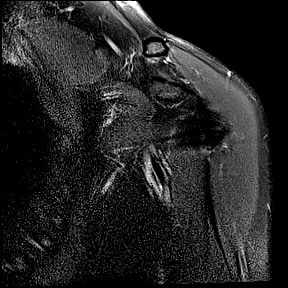
[im 7/18]
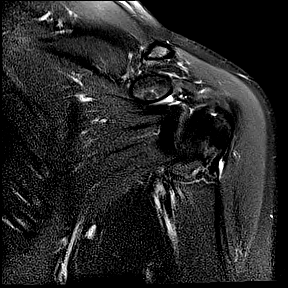
[im 9/18]
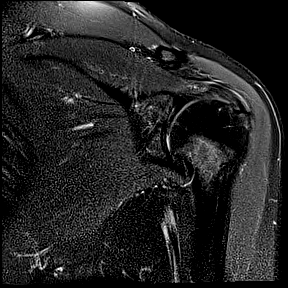
[im 11/18]
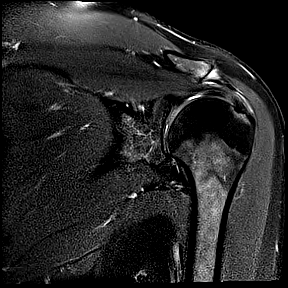
[im 13/18]
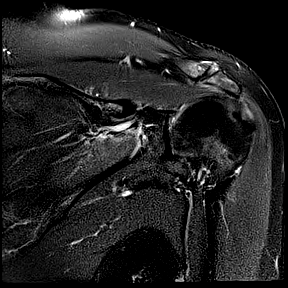
[im 15/18]
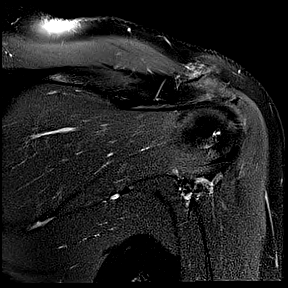
[im 18/18]
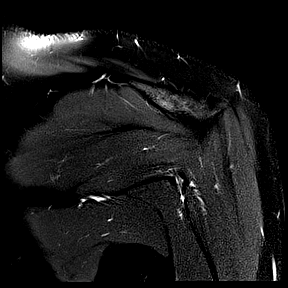

[Series 7: t2_sag_fs_blade · oblique · left · 3.0mm · 0.49mm/px · 10 of 21 slices shown]
[im 1/21]
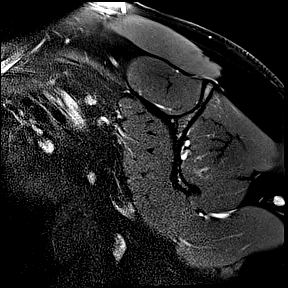
[im 3/21]
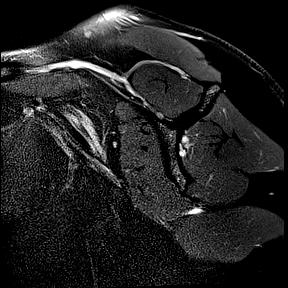
[im 5/21]
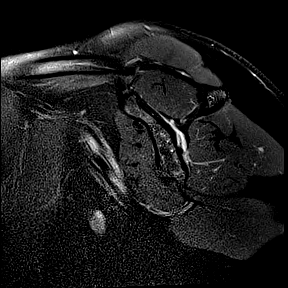
[im 7/21]
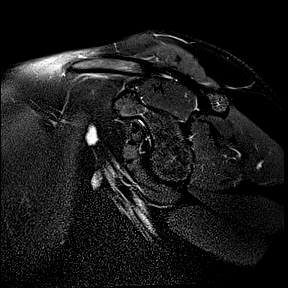
[im 9/21]
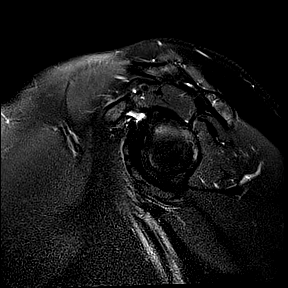
[im 12/21]
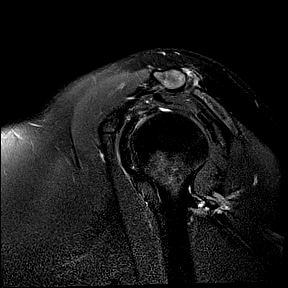
[im 14/21]
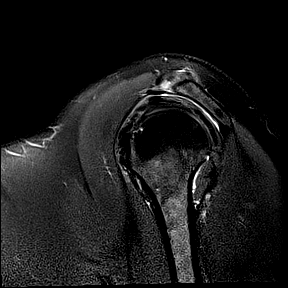
[im 16/21]
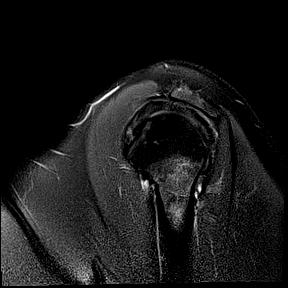
[im 18/21]
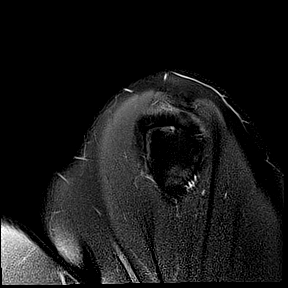
[im 21/21]
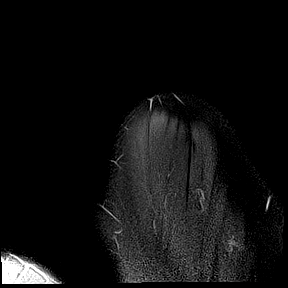

[Series 8: t1_sag_blade · oblique · left · 3.0mm · 0.46mm/px · 10 of 21 slices shown]
[im 1/21]
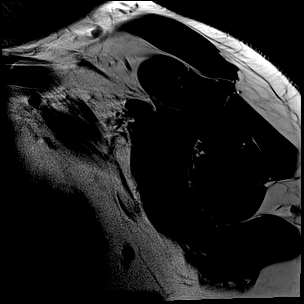
[im 3/21]
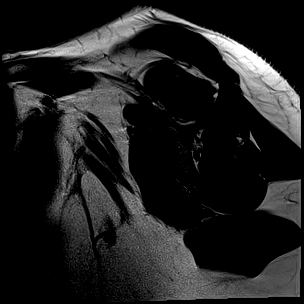
[im 5/21]
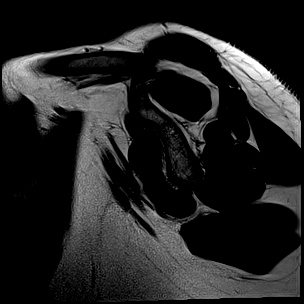
[im 7/21]
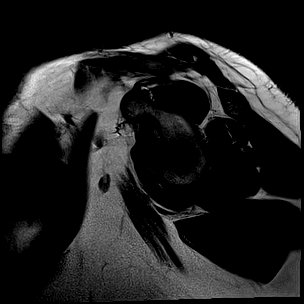
[im 9/21]
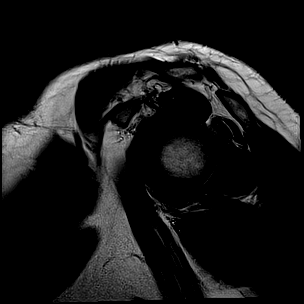
[im 12/21]
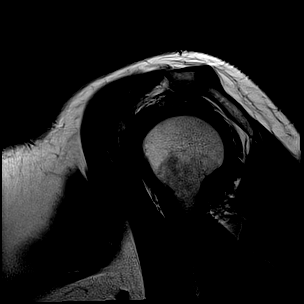
[im 14/21]
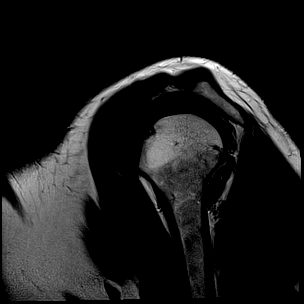
[im 16/21]
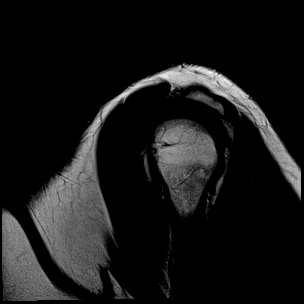
[im 18/21]
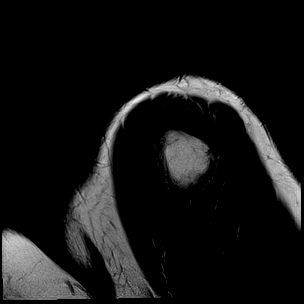
[im 21/21]
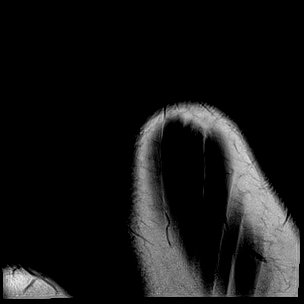

[40 of 40 positions shown; findings below may reference images not displayed]

FINDINGS: OSSEOUS: No acute fracture, avascular necrosis or aggressive osseous lesion. 

ACROMIAL OUTLET: Mild acromioclavicular joint osteoarthritis. A small  amount of fluid is present within the subacromial/subdeltoid bursa. The acromion is nonhooked. The coracoclavicular and coracoacromial ligaments are intact.

ROTATOR CUFF:

Supraspinatus and infraspinatus: Mild supraspinatus and infraspinatus tendinosis.

Teres minor: Intact.

Subscapularis: Mild tendinosis.

Fatty atrophy: The rotator cuff muscles are maintained.

LABRUM: The labrum is intact without fluid-filled labral cleft or paralabral cyst.

BICEPS TENDON: The proximal intra-articular and extra-articular long head biceps tendon are intact.

GLENOHUMERAL JOINT: The glenohumeral articular cartilage is maintained. No focal chondral defect is identified. The glenohumeral joint is normal in alignment.

OTHER: The inferior glenohumeral ligament is unremarkable. No glenohumeral joint effusion.
IMPRESSION: 1.
Mild supraspinatus and infraspinatus tendinosis. No rotator cuff tear.

2.
Small-volume fluid within the subacromial/subdeltoid bursa. Correlate with clinical signs of bursitis.

3.
Mild acromioclavicular joint osteoarthritis.

## 2020-08-10 IMAGING — MR MRI SHOULDER RT WO CONTRAST
4 series · 40 of 40 positions shown · non-contrast
Comparison: Right shoulder radiographs 08/10/2020

INDICATION: Pain in right shoulder .
TECHNIQUE: Multiplanar, multiecho imaging of the right shoulder was performed, including T1-weighted and fluid sensitive sequences without intravenous contrast.

[Series 5: t2_axial_fs · axial · right · 3.0mm · 0.51mm/px · z∈[-32,+52]mm · 8 of 22 slices shown]
[im 1/22]
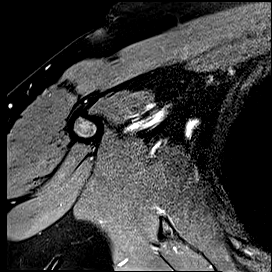
[im 4/22]
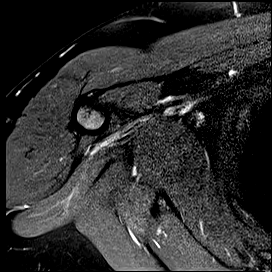
[im 7/22]
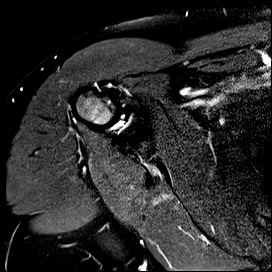
[im 10/22]
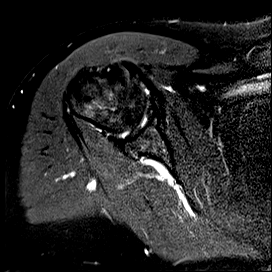
[im 13/22]
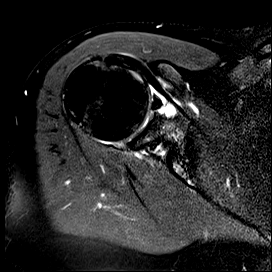
[im 16/22]
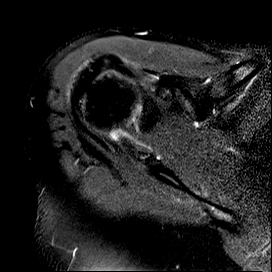
[im 19/22]
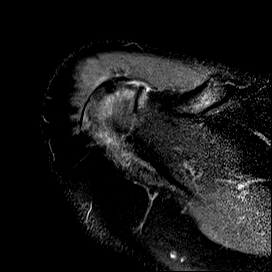
[im 22/22]
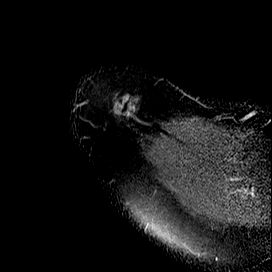

[Series 6: t2_cor_obl_fs · oblique · right · 3.0mm · 0.51mm/px · 8 of 18 slices shown]
[im 1/18]
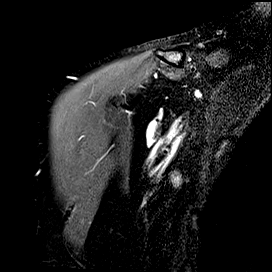
[im 3/18]
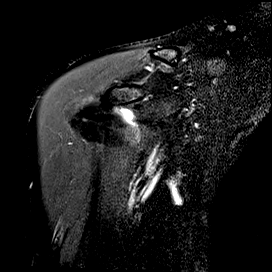
[im 5/18]
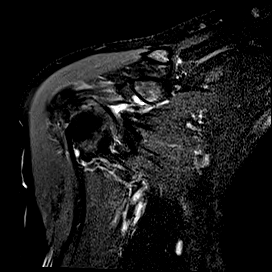
[im 8/18]
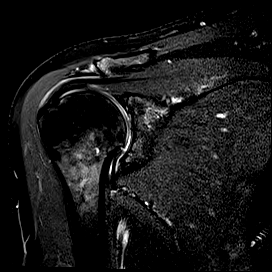
[im 10/18]
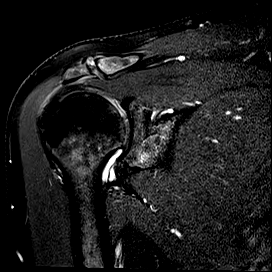
[im 13/18]
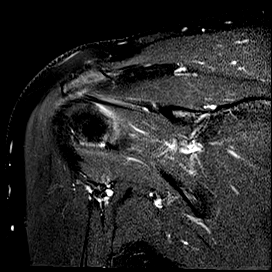
[im 15/18]
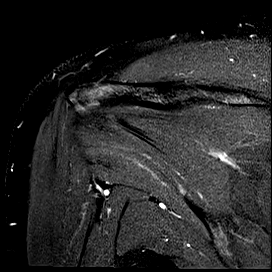
[im 18/18]
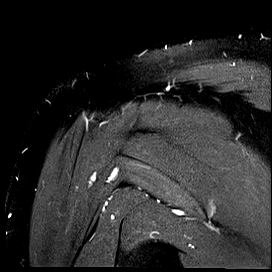

[Series 7: t2_sag_obl_fs · oblique · right · 3.0mm · 0.49mm/px · 12 of 28 slices shown]
[im 1/28]
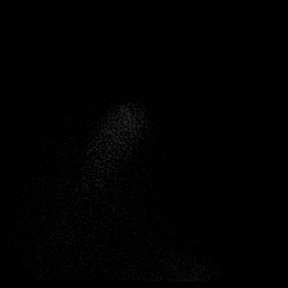
[im 3/28]
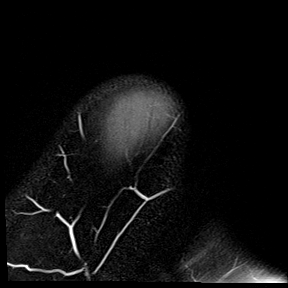
[im 5/28]
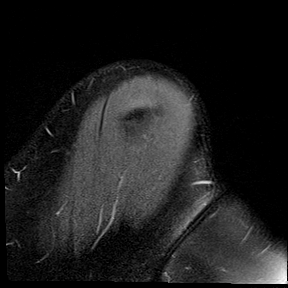
[im 8/28]
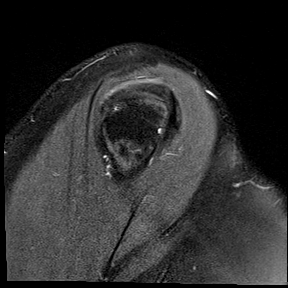
[im 10/28]
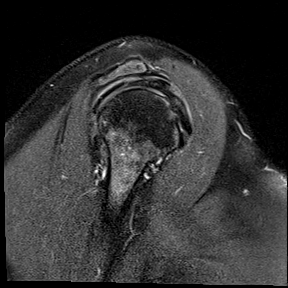
[im 13/28]
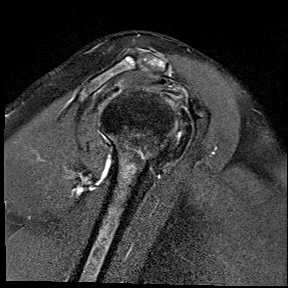
[im 15/28]
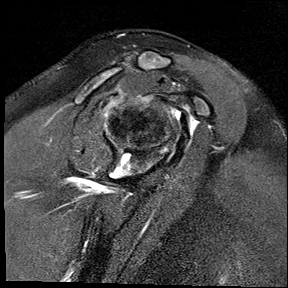
[im 18/28]
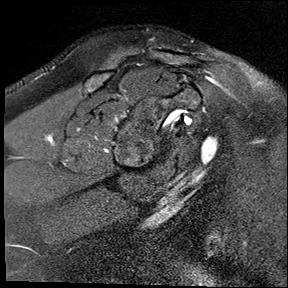
[im 20/28]
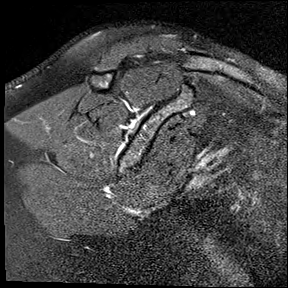
[im 23/28]
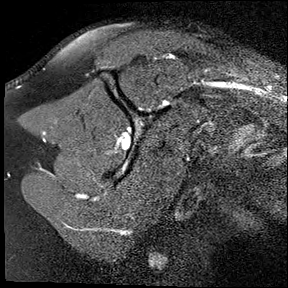
[im 25/28]
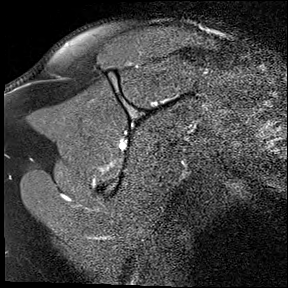
[im 28/28]
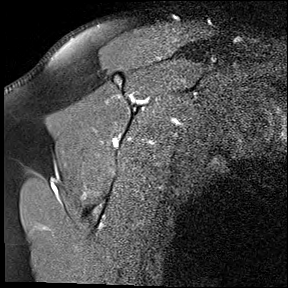

[Series 8: t1_sag_obl · oblique · right · 3.0mm · 0.44mm/px · 12 of 28 slices shown]
[im 1/28]
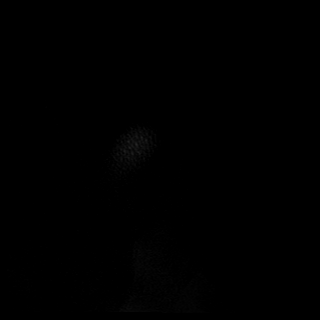
[im 3/28]
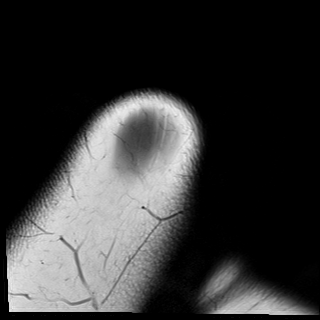
[im 5/28]
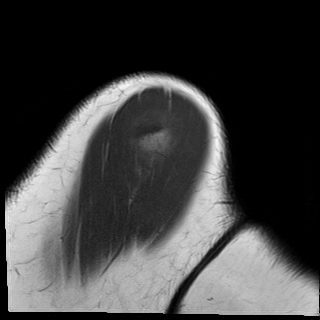
[im 8/28]
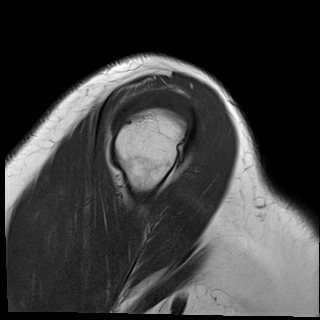
[im 10/28]
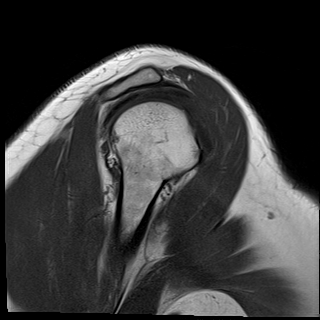
[im 13/28]
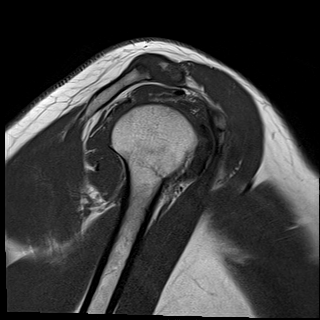
[im 15/28]
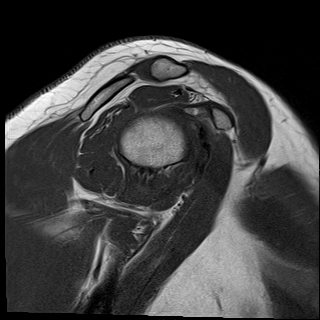
[im 18/28]
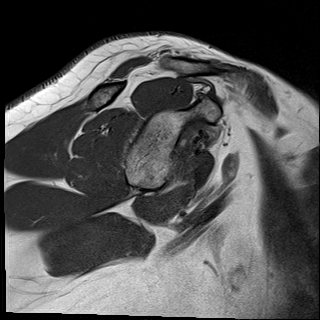
[im 20/28]
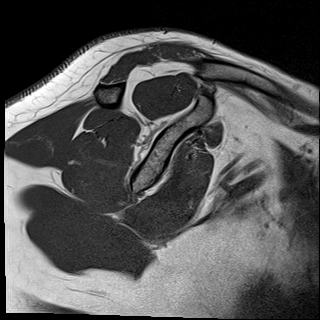
[im 23/28]
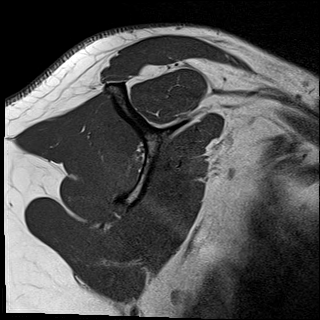
[im 25/28]
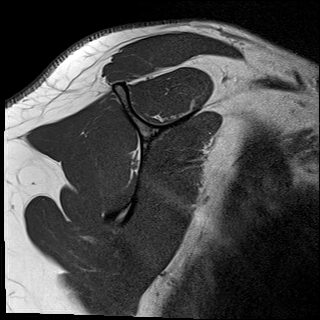
[im 28/28]
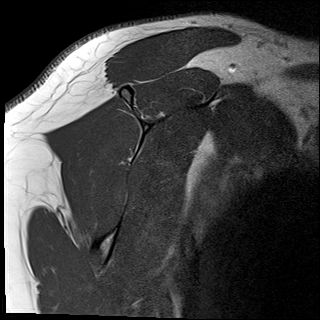

[40 of 40 positions shown; findings below may reference images not displayed]

FINDINGS: Rotator cuff:

Supraspinatus and infraspinatus: Mild tendinosis of the supraspinatus. Infraspinatus tendon is intact.

Subscapularis: Small low-grade partial-thickness undersurface tear, at the insertion.

Teres minor: Intact. 

Cuff muscles: Normal in size and signal intensity.  No fatty infiltration.

Acromioclavicular joint: Minimal DJD.

MUCIC bursa: No bursitis.

Long head biceps tendon: Partial-thickness tearing of the intra-articular portion.

Rotator Interval: No scar or obliteration of fat.

Labrum: Diffuse degenerative tearing of the labrum.

Cartilage: Extensive low and high-grade partial-thickness chondral loss.

Marrow: Within normal limits.

No joint effusion. No soft tissue mass. No fluid collection.
IMPRESSION: 1. Moderate right glenohumeral joint DJD.

2. Partial-thickness tearing of the intra-articular long head biceps tendon.

## 2020-08-10 IMAGING — CR SHOULDER RT 2-3 VWS
1 series · 3 of 3 positions shown · non-contrast
Comparison: None.

REASON FOR EXAM: Right shoulder pain.

[Series 1: internal rotate · 0.17mm/px · 3 of 3 slices shown]
[im 1/3]
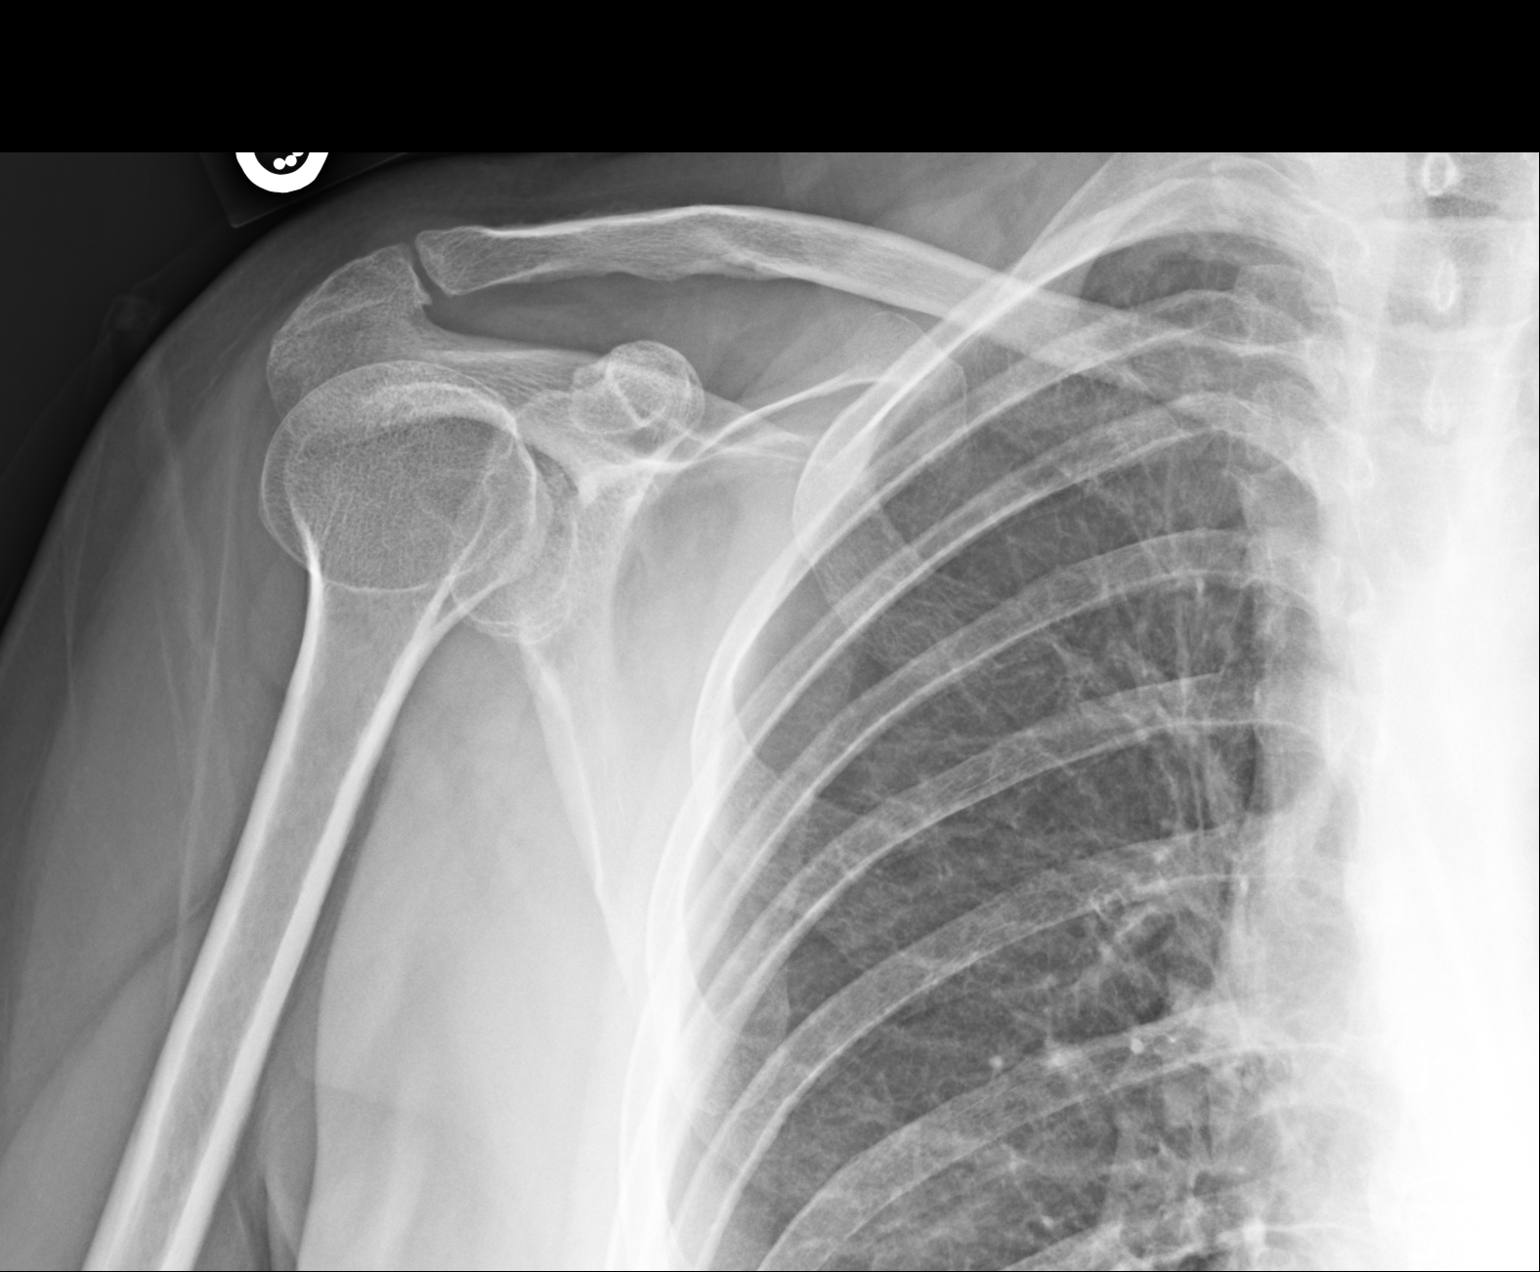
[im 2/3]
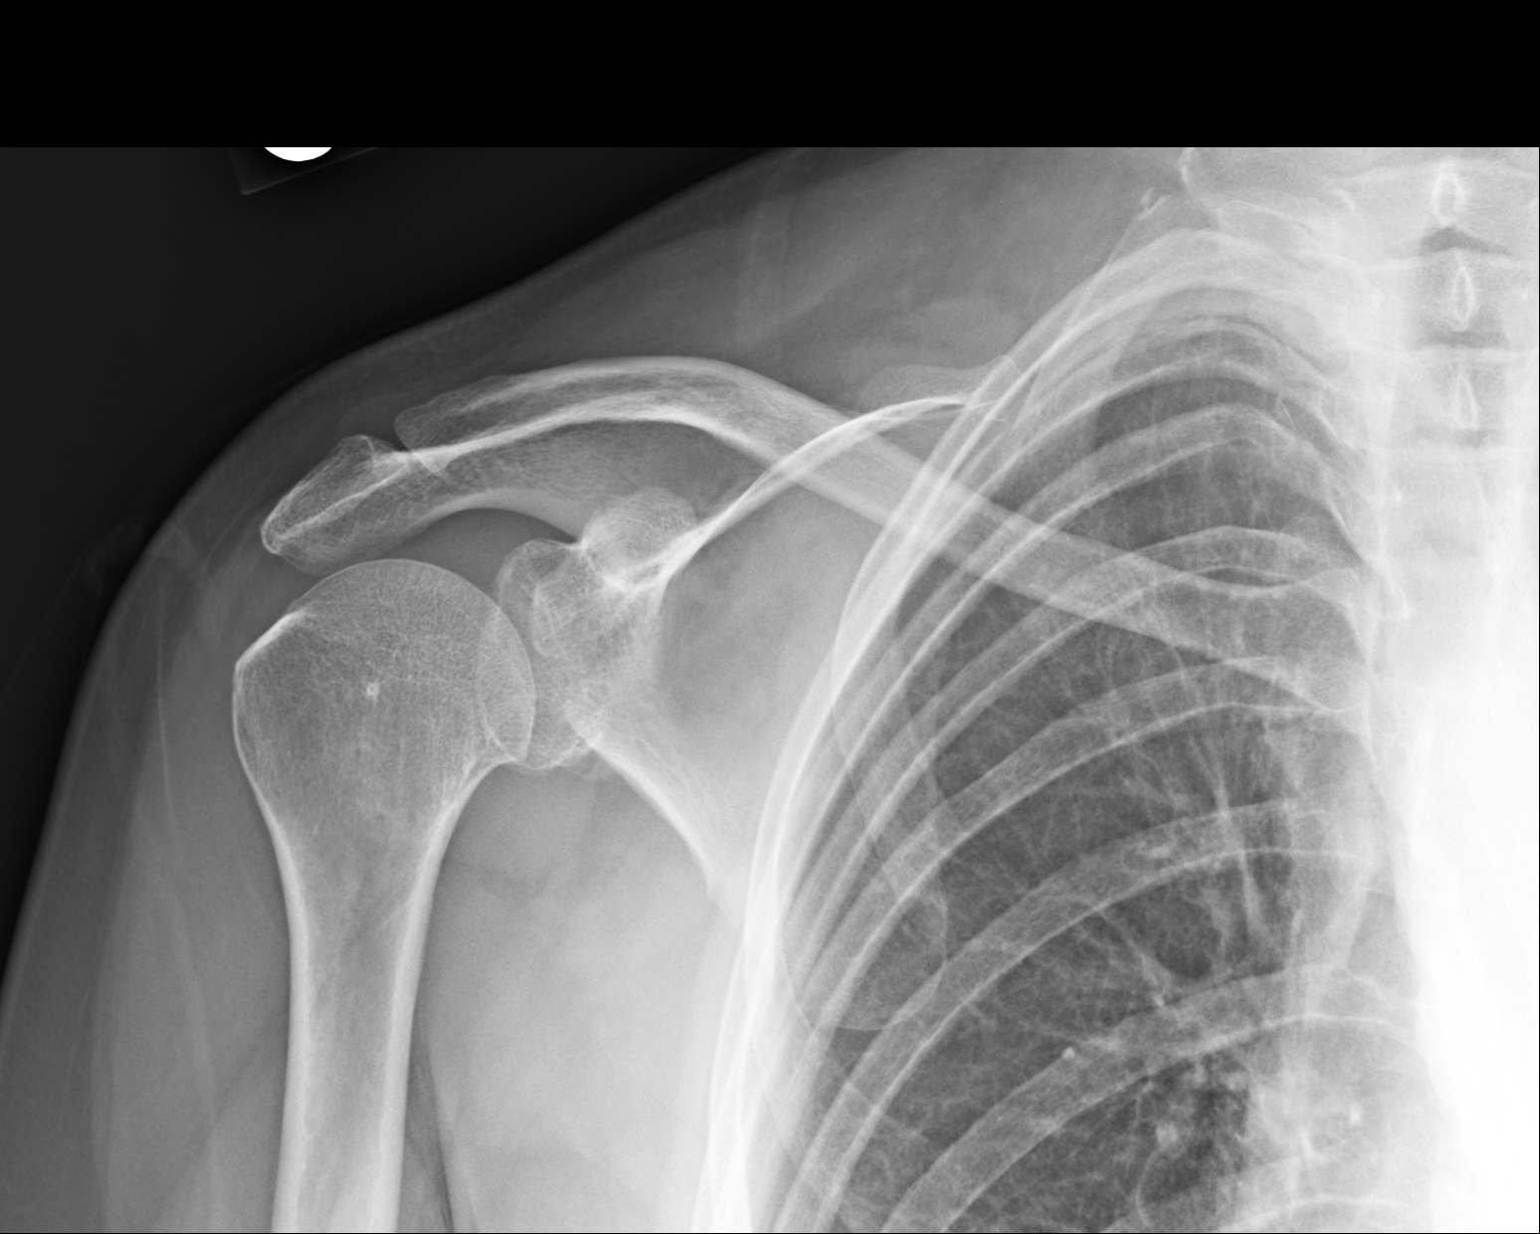
[im 3/3]
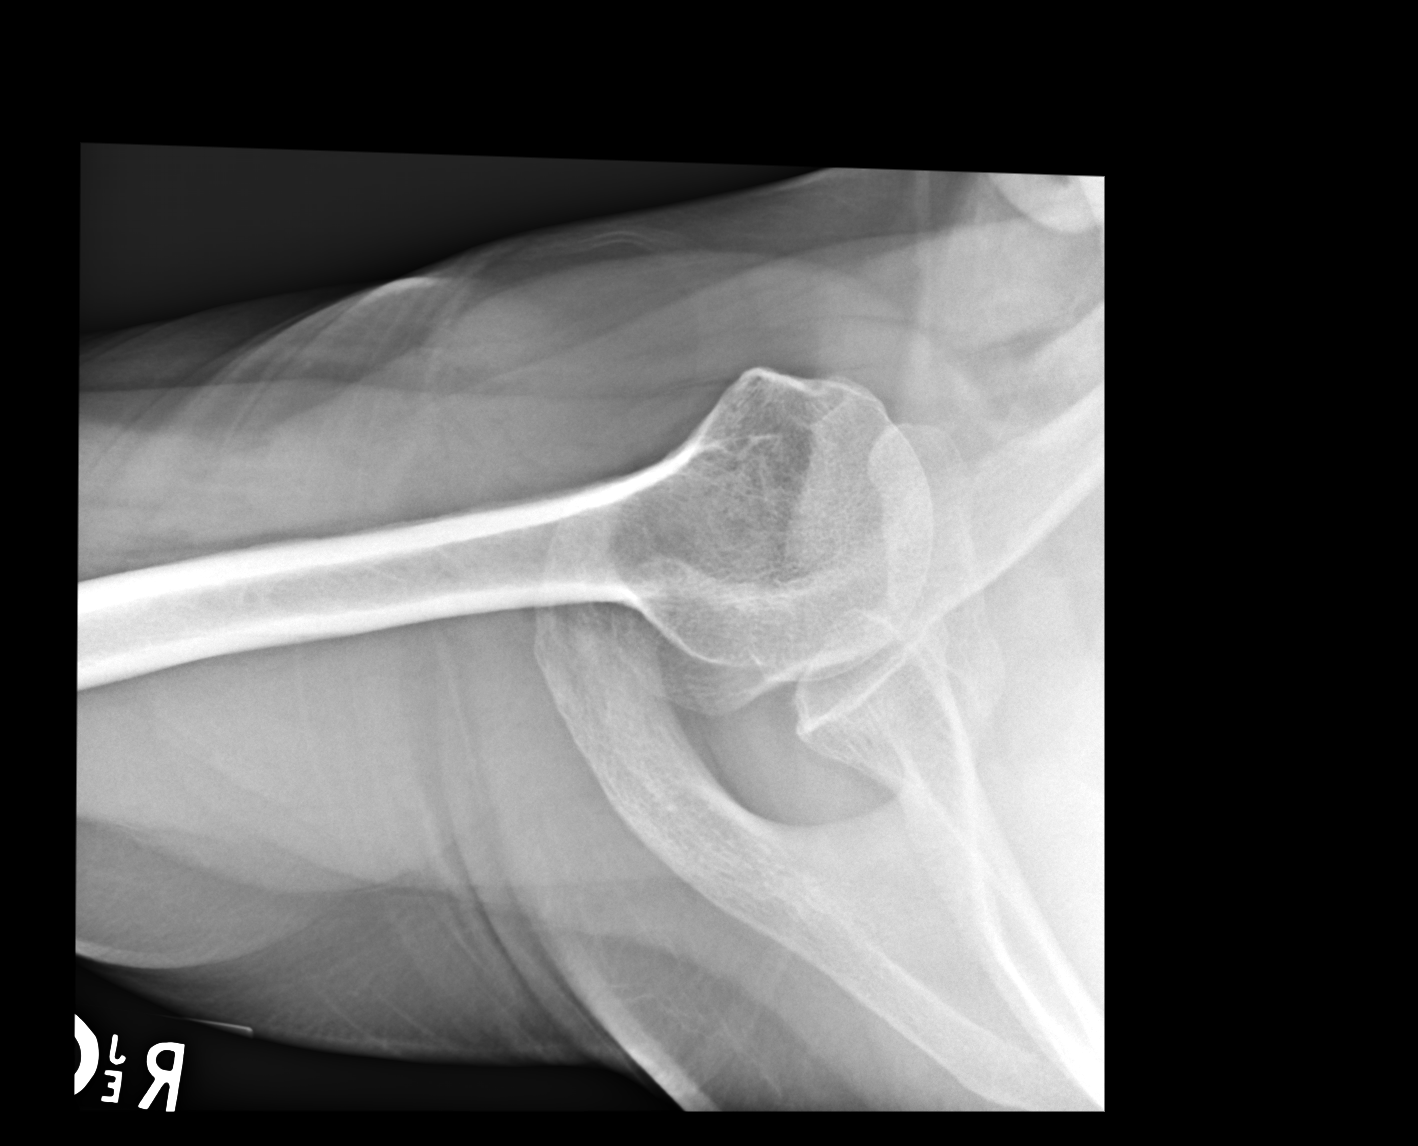

[3 of 3 positions shown; findings below may reference images not displayed]

TECHNIQUE AND FINDINGS: 3 views of the right shoulder show normal alignment of the glenohumeral and acromioclavicular joints. There is no fracture or subluxation seen. The soft tissues are unremarkable.
IMPRESSION: Negative right shoulder.

## 2020-08-10 IMAGING — CR HIP RT 2 VW
1 series · 2 of 2 positions shown · non-contrast
Comparison: None.

REASON FOR EXAM: Right hip pain.

[Series 1: ap · 0.17mm/px · 2 of 2 slices shown]
[im 1/2]
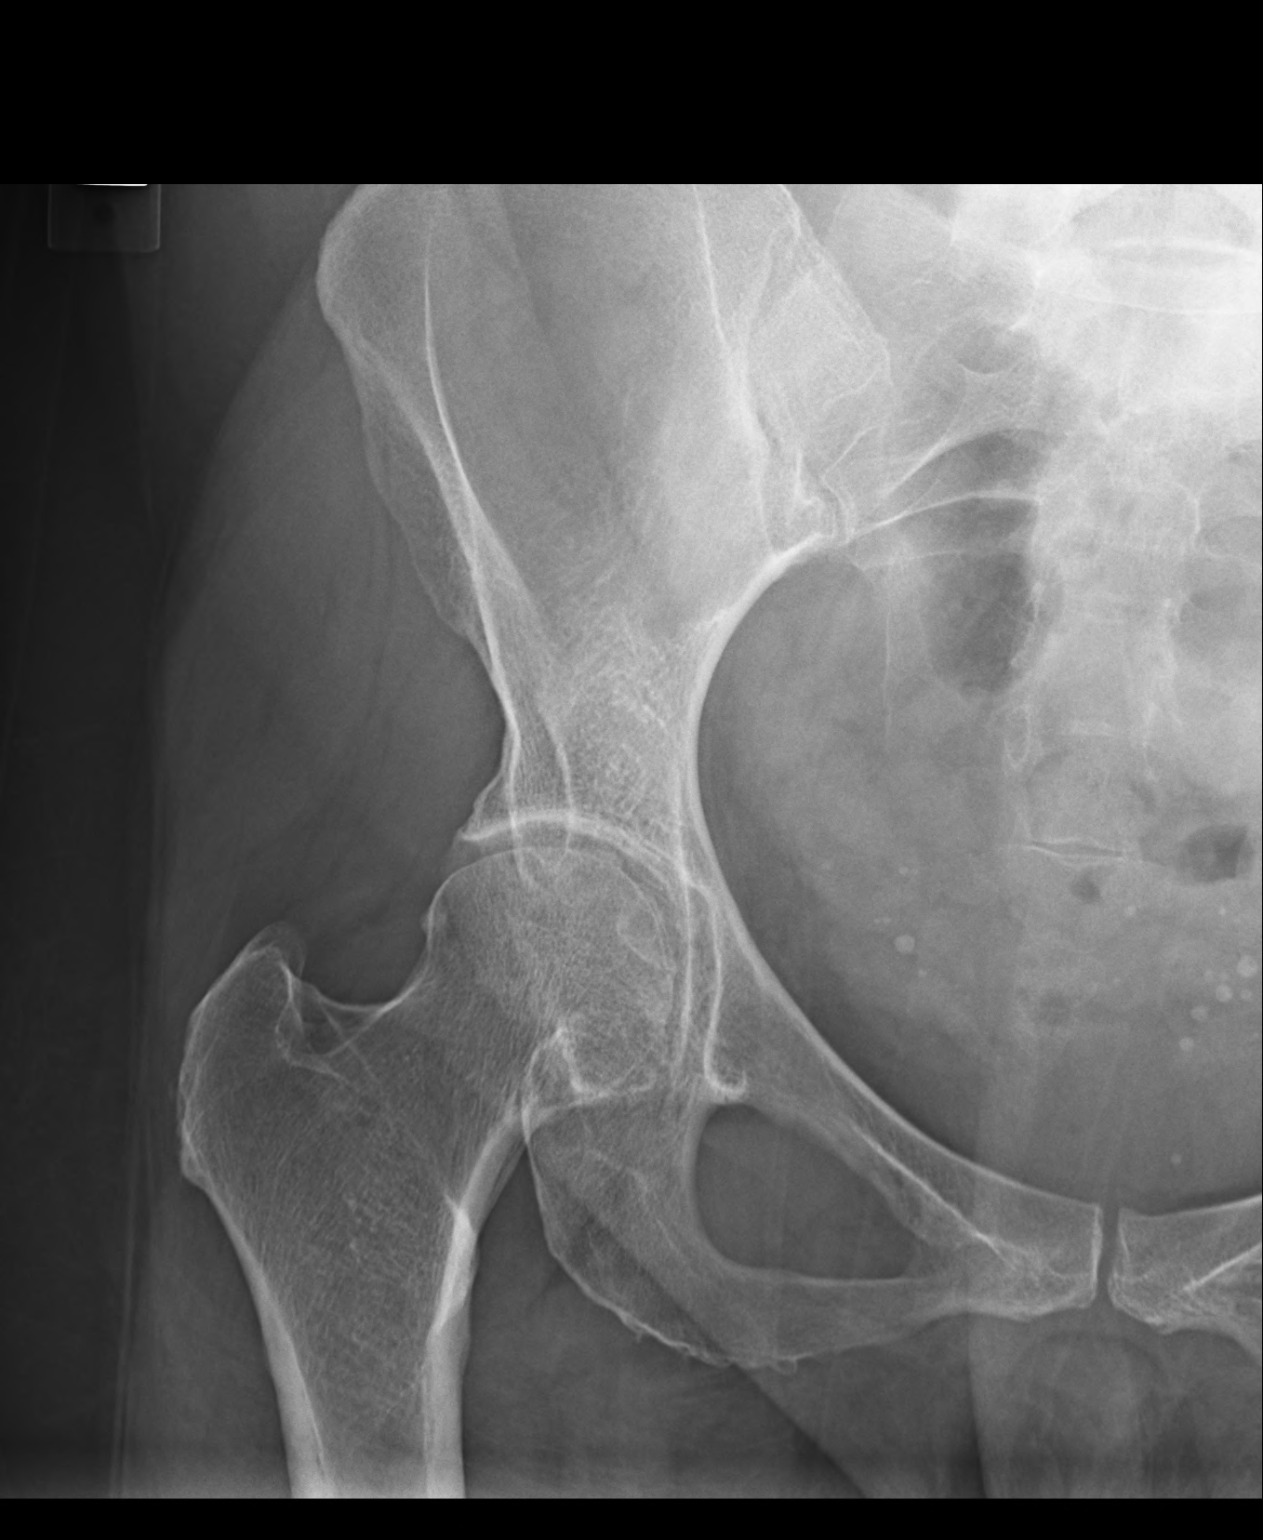
[im 2/2]
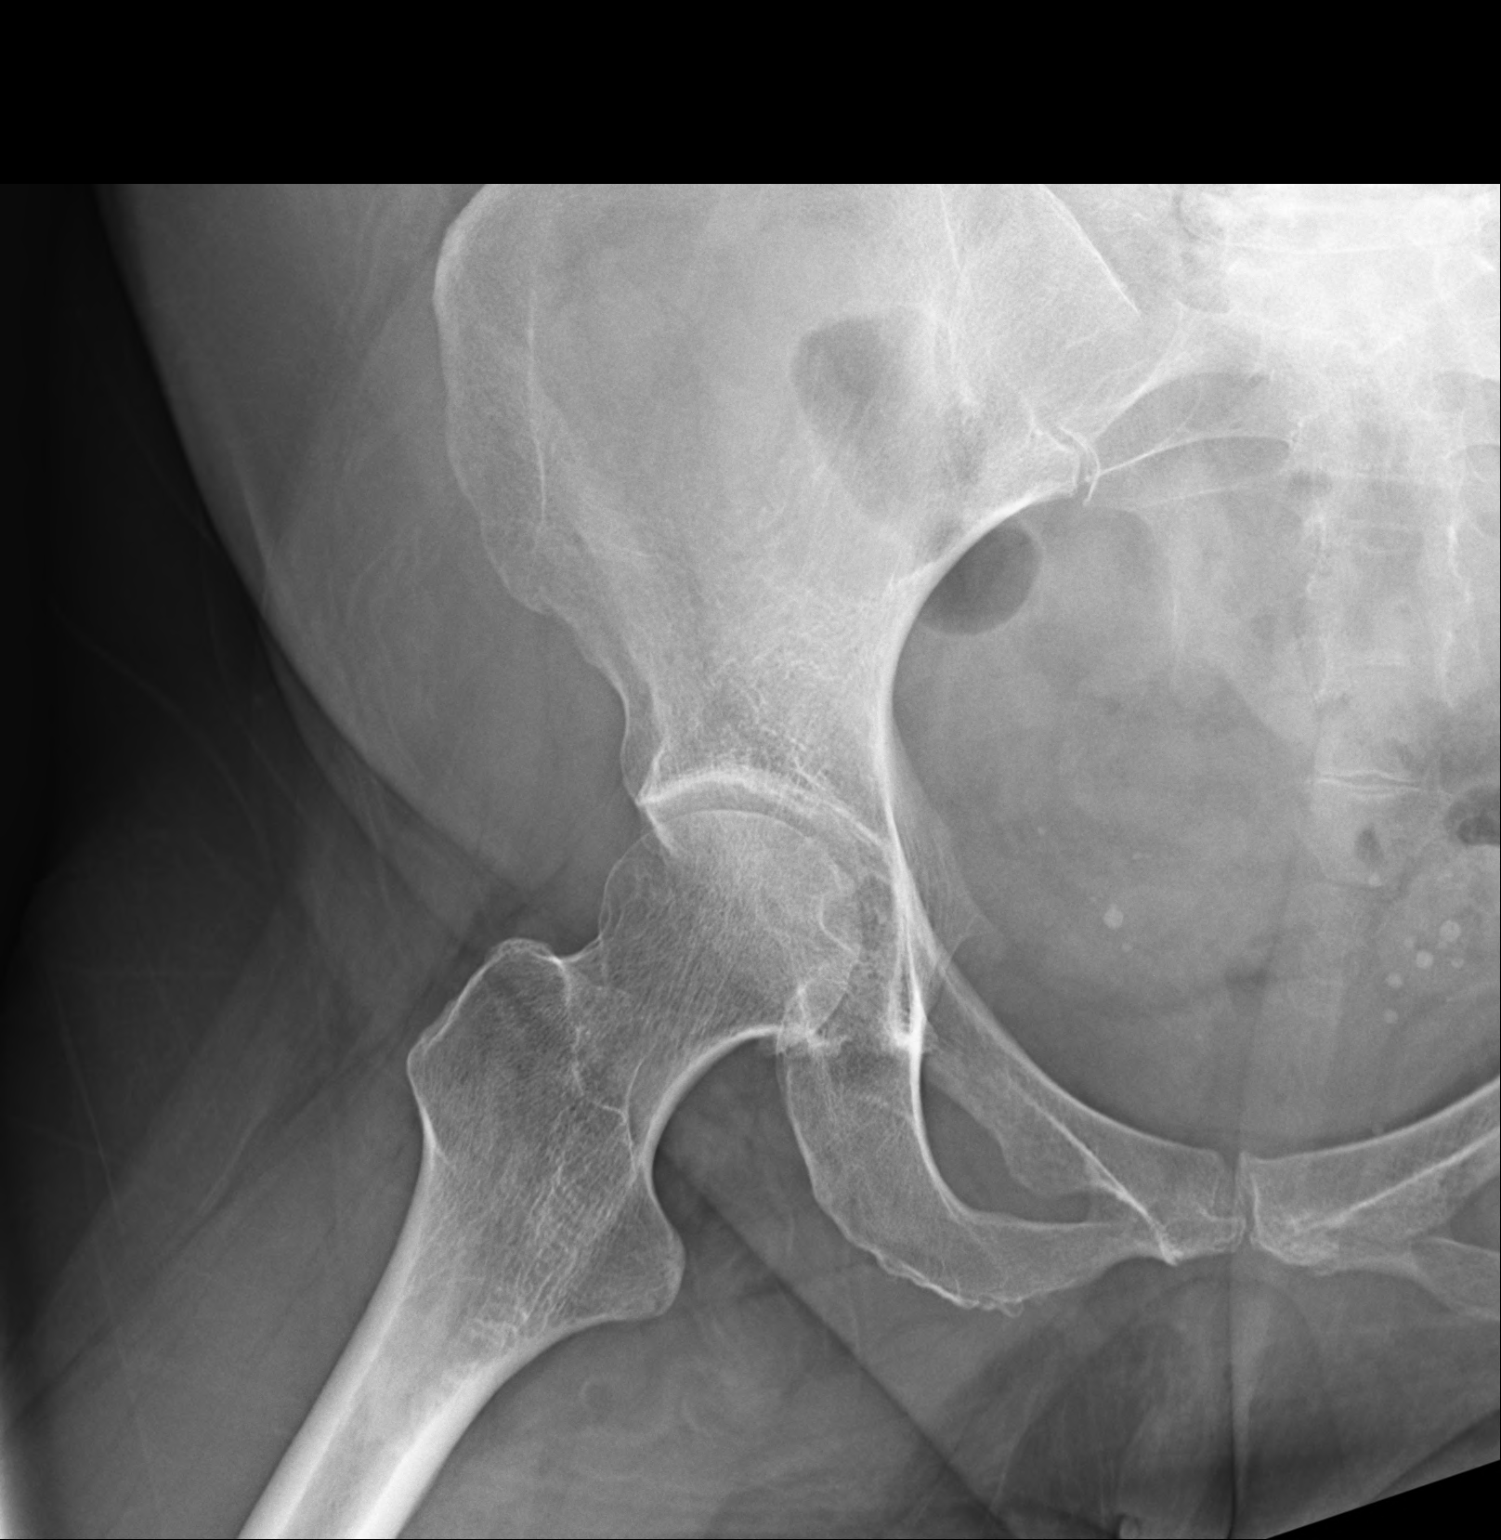

[2 of 2 positions shown; findings below may reference images not displayed]

TECHNIQUE AND FINDINGS: 2 views of the right hip show normal alignment of the hip joint. There is mild joint space narrowing with slight osteophyte formation present. No fracture is identified. Texture is normal. Right SI joint and pubic symphysis are normal in appearance.
IMPRESSION: Mild osteoarthritis otherwise negative.

## 2020-08-18 IMAGING — MR MRI HIP RT WO CONTRAST
4 of 5 series · 35 of 40 positions shown · non-contrast
Comparison: Right hip radiographs, 08/10/2018.

HISTORY: Pain in right hip
TECHNIQUE: Multiplanar and multisequence MR imaging of the right hip was performed without the administration of intravenous contrast.

[Series 2: t1_cor · coronal · right · 4.0mm · 0.66mm/px · 9 of 30 slices shown]
[im 1/30]
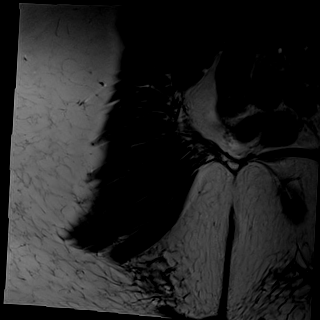
[im 4/30]
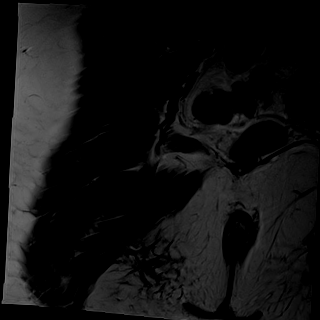
[im 8/30]
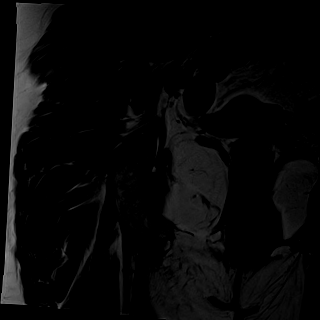
[im 11/30]
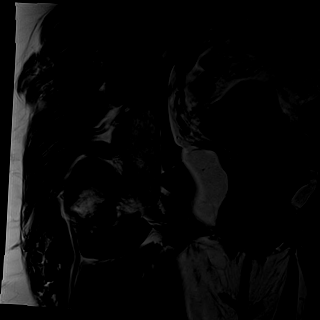
[im 15/30]
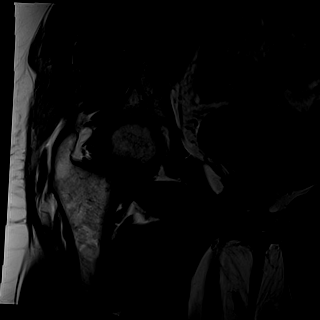
[im 19/30]
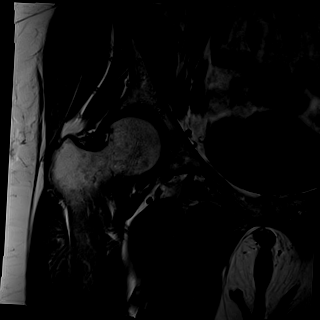
[im 22/30]
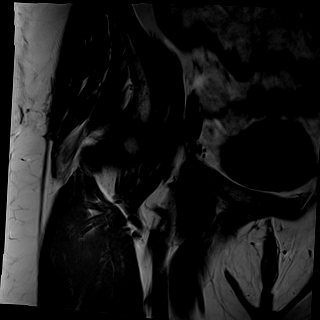
[im 26/30]
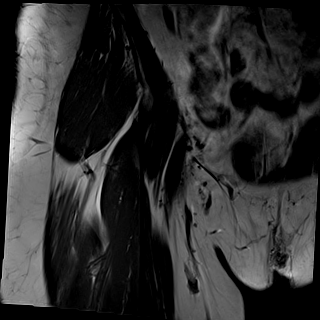
[im 30/30]
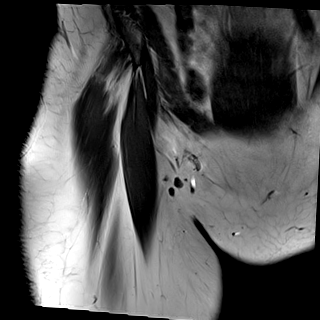

[Series 3: t2_cor_fs · coronal · right · 4.0mm · 0.82mm/px · 8 of 30 slices shown]
[im 1/30]
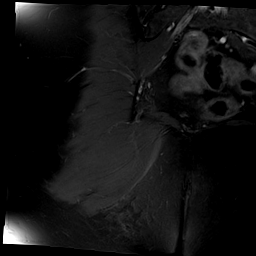
[im 5/30]
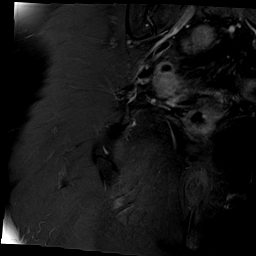
[im 9/30]
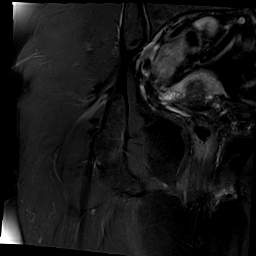
[im 13/30]
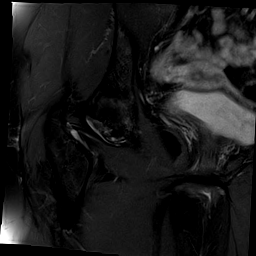
[im 17/30]
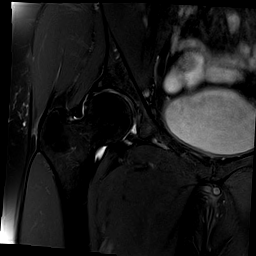
[im 21/30]
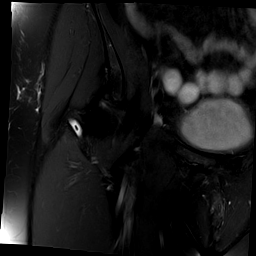
[im 25/30]
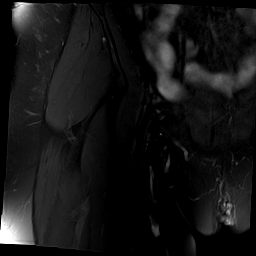
[im 30/30]
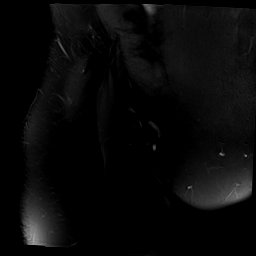

[Series 4: t2_axial_fs · axial · right · 4.0mm · 0.62mm/px · z∈[-123,+52]mm · 9 of 36 slices shown]
[im 1/36]
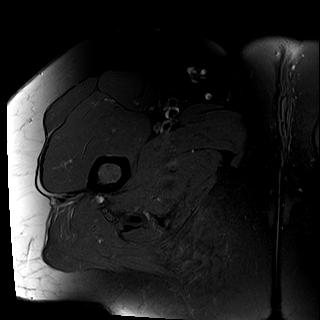
[im 5/36]
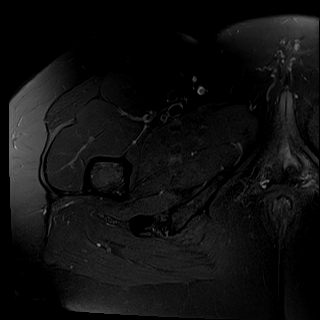
[im 9/36]
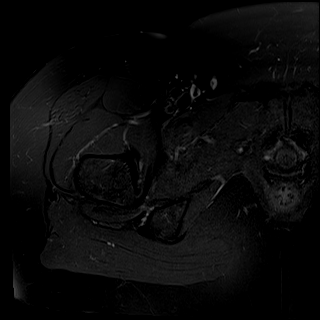
[im 14/36]
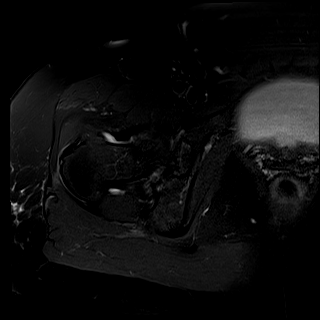
[im 18/36]
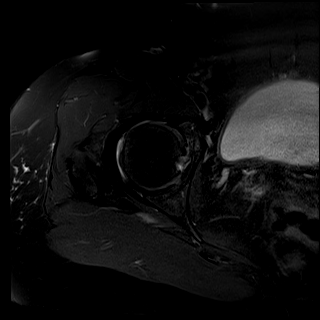
[im 22/36]
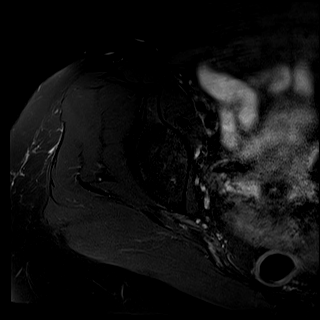
[im 27/36]
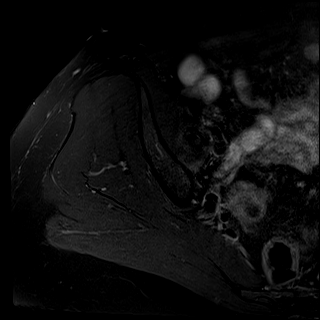
[im 31/36]
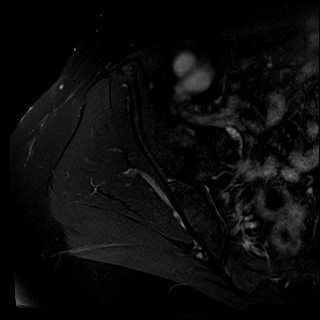
[im 36/36]
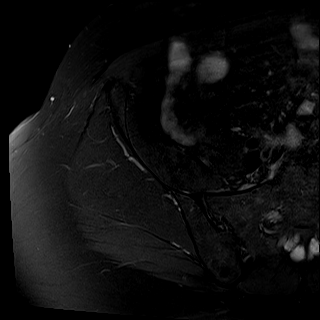

[Series 5: t1_axial · axial · right · 4.0mm · 0.39mm/px · z∈[-123,+52]mm · 9 of 36 slices shown]
[im 1/36]
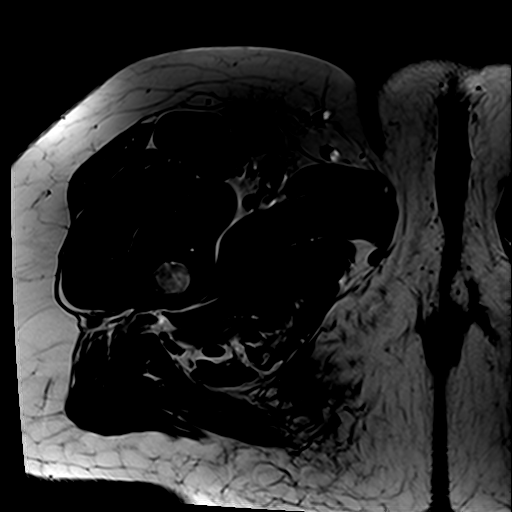
[im 5/36]
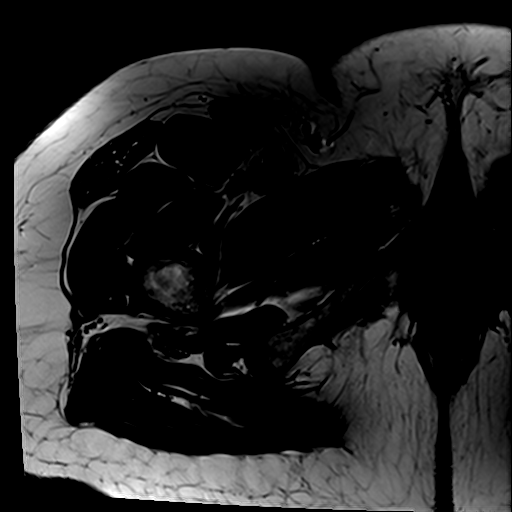
[im 9/36]
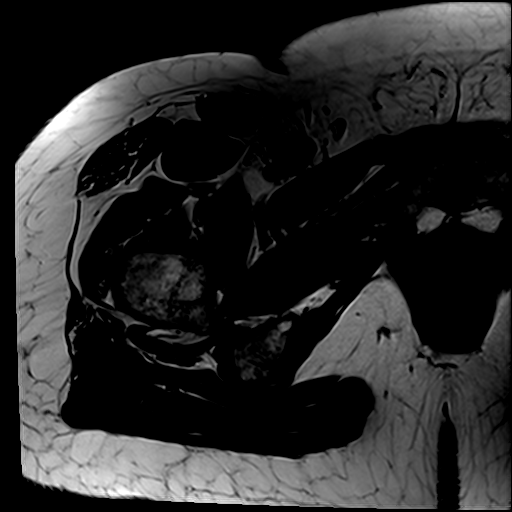
[im 14/36]
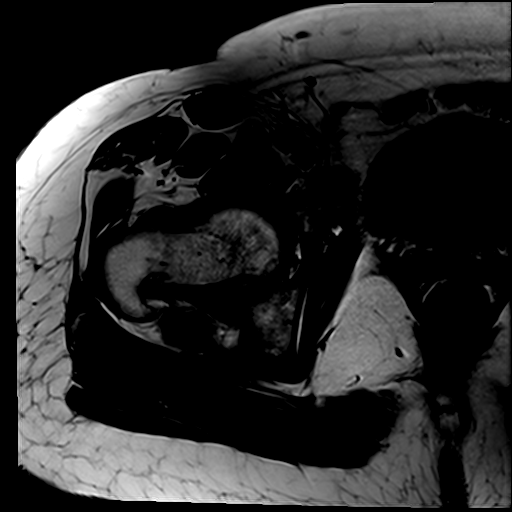
[im 18/36]
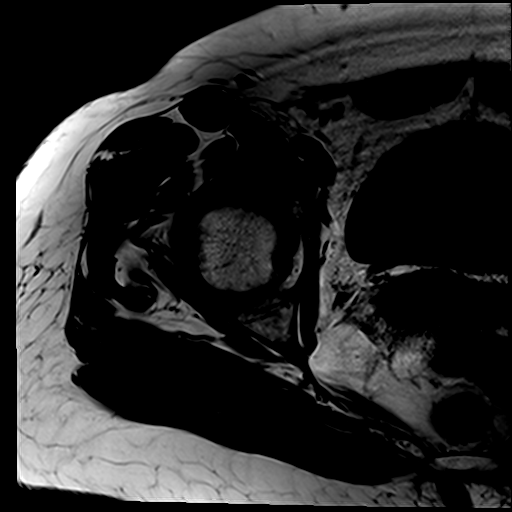
[im 22/36]
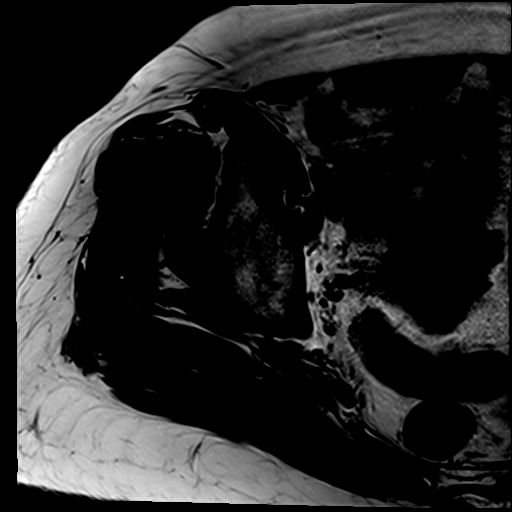
[im 27/36]
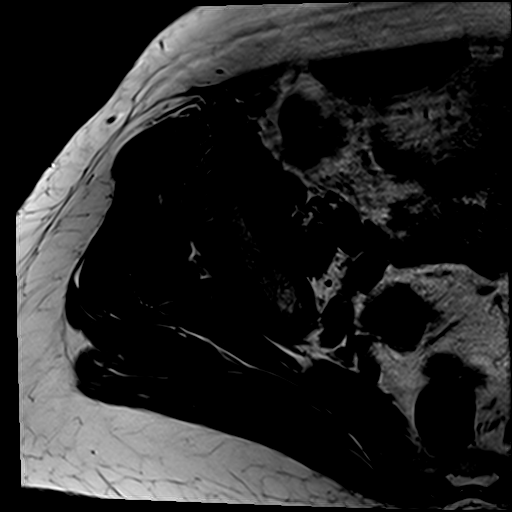
[im 31/36]
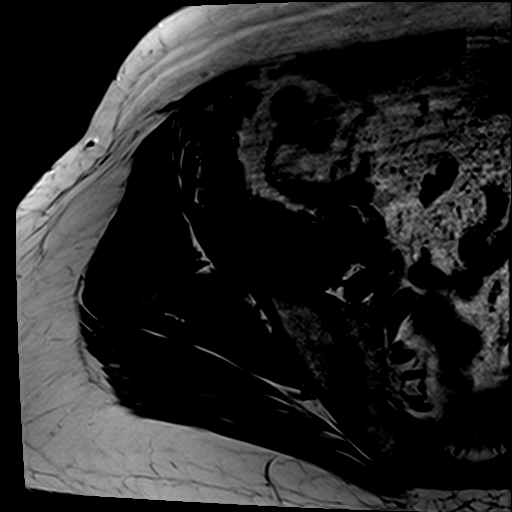
[im 36/36]
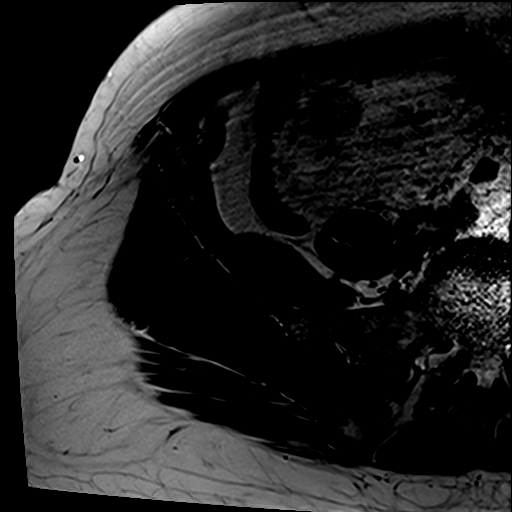

[35 of 40 positions shown; findings below may reference images not displayed]

FINDINGS: Osseous: No acute fracture, avascular necrosis or aggressive osseous lesion. The sacroiliac joints and pubic symphysis are unremarkable in appearance.

Right hip joint: Tearing through the chondral labral junction of the anterior acetabular labrum, with extension of tearing of the substance of the anterior superior acetabulum. Mild chondral thinning of the anterosuperior and superior femoroacetabular articular cartilage with superimposed deep partial-thickness chondral fissuring of the superior acetabular articular cartilage. Small femoral head marginal osteophytes. Small hip joint effusion.

Gluteal tendons and greater trochanteric bursa: The gluteus medius and minimus tendons are intact. No focal fluid within the greater trochanteric bursa.

Other musculotendinous structures: The rectus femoris and iliopsoas tendons are intact. The proximal hamstring origin tendon is intact. Narrowing of the ischiofemoral space, with moderate fatty atrophy of the quadratus femoris muscle. No additional muscle fatty atrophy. No intramuscular edema.

Sciatic nerve: Visualized course and caliber of the right sciatic nerve is unremarkable in appearance.

Other: Visualized intrapelvic contents are unremarkable in appearance.
IMPRESSION: 1.
Tearing of the anterior and anterosuperior right hip acetabular labrum. Mild background right hip osteoarthritis.

2.
Narrowing of the ischiofemoral space, with moderate fatty atrophy of the quadratus femoris muscle. Correlate with clinical signs of ischiofemoral impingement syndrome.

## 2021-03-21 IMAGING — MG MAMMO SCRN BIL W/CAD TOMO
8 series · 8 of 24 positions shown · non-contrast
Comparison: The present examination has been compared to prior imaging studies.

Images Obtained from [facility]
INDICATION: Screening.
TECHNIQUE: Bilateral 2-D digital screening mammogram was performed followed by 3-D tomosynthesis.  Current study was also evaluated with a computer aided detection (CAD) system.
MAMMOGRAM FINDINGS:
There are scattered areas of fibroglandular density.
No suspicious abnormality is seen in either breast.  There are no significant changes from the prior study.

[R MLO]
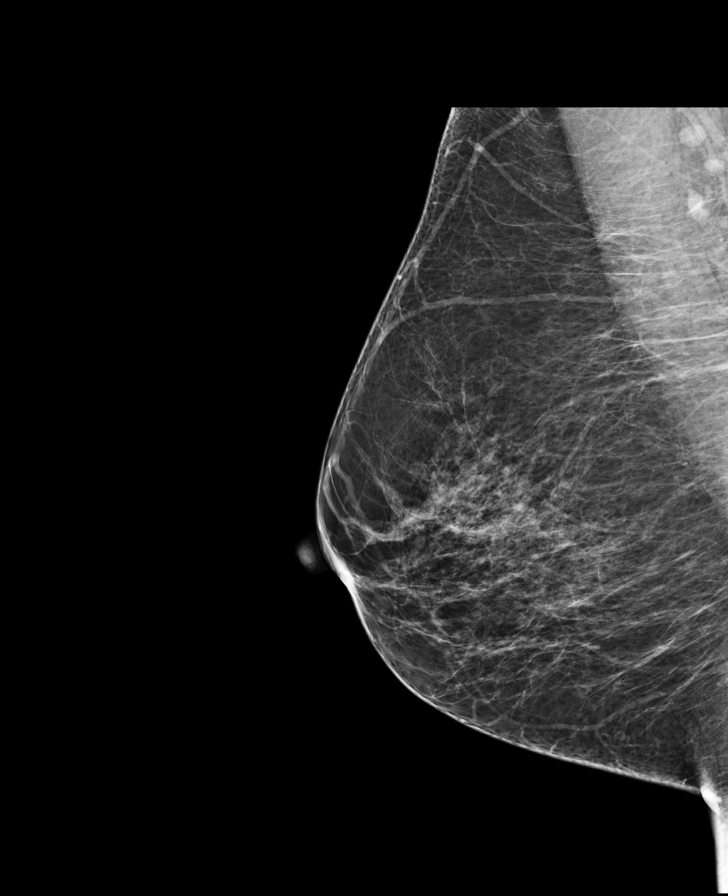

[R CC]
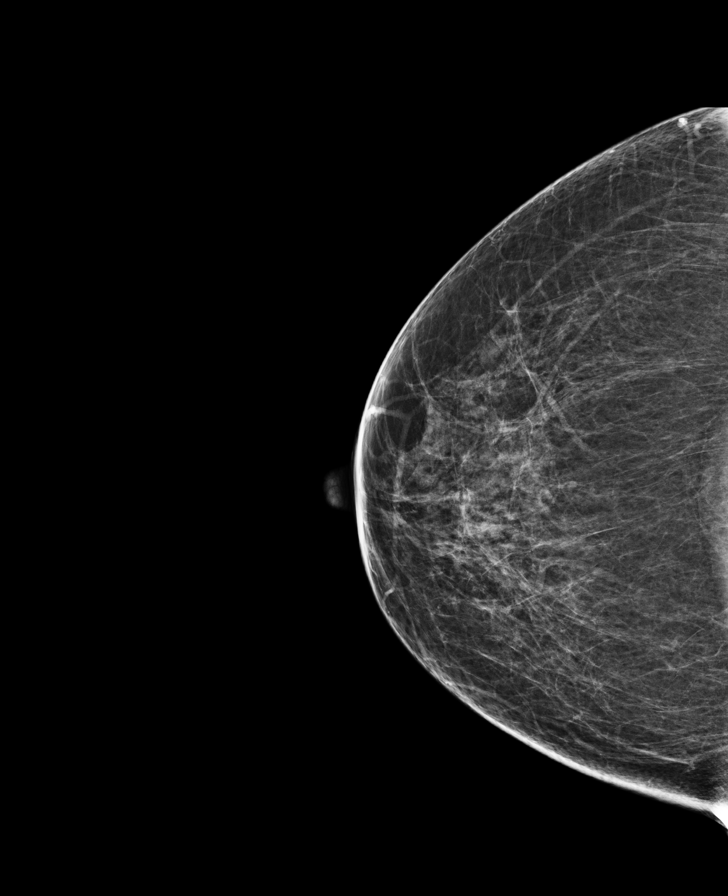

[L MLO]
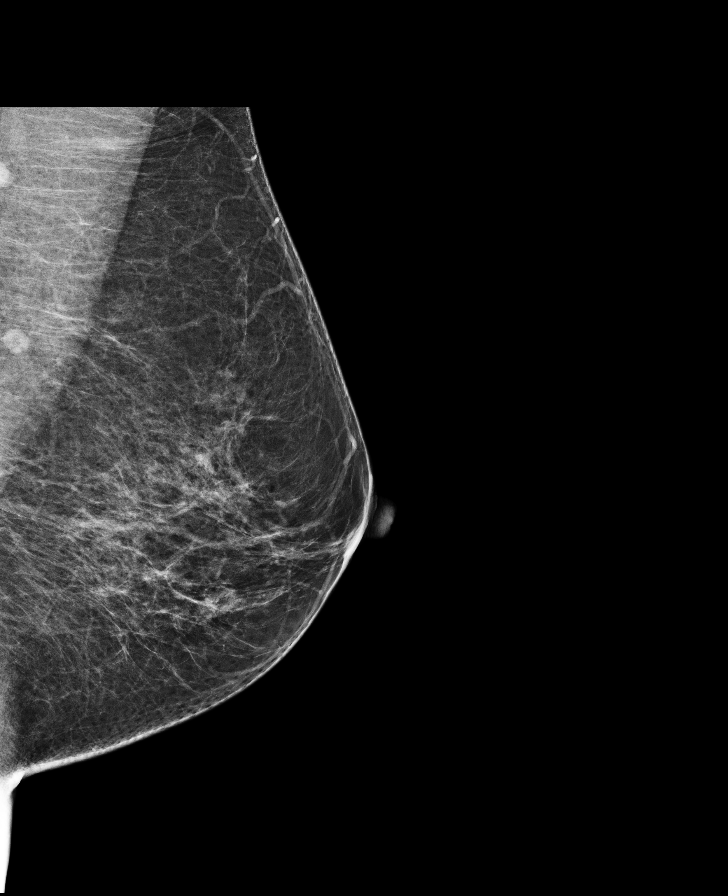

[L CC]
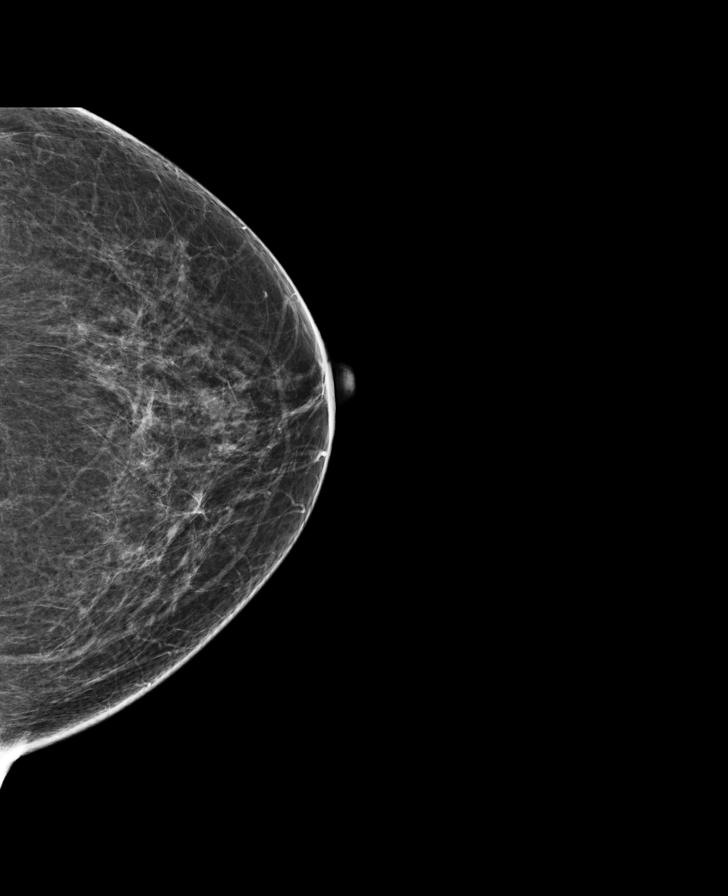

[R CC tomo · tomo slice 33/64.0]
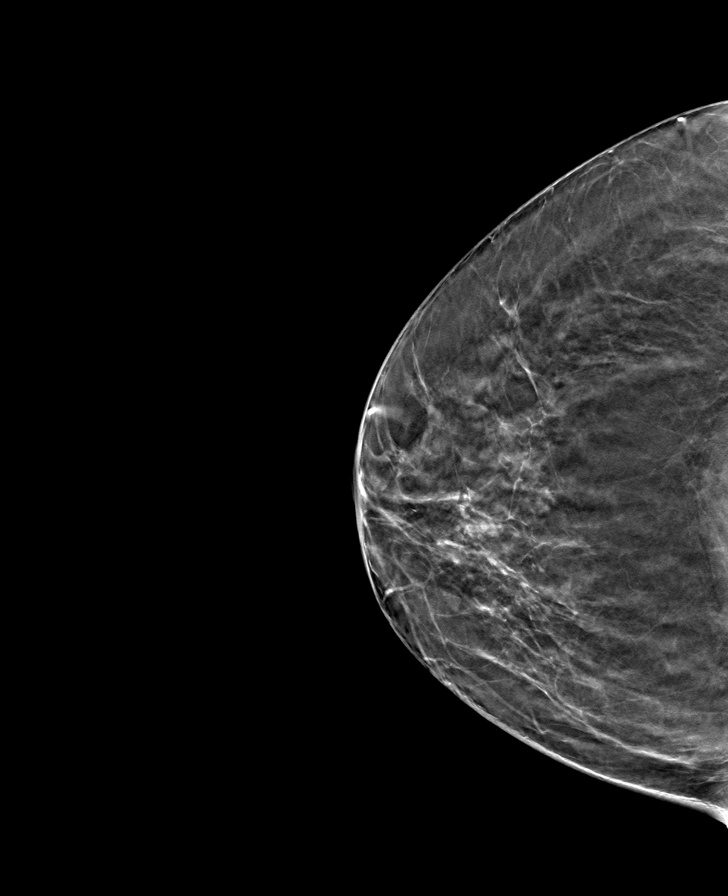

[L MLO tomo · tomo slice 33/64.0]
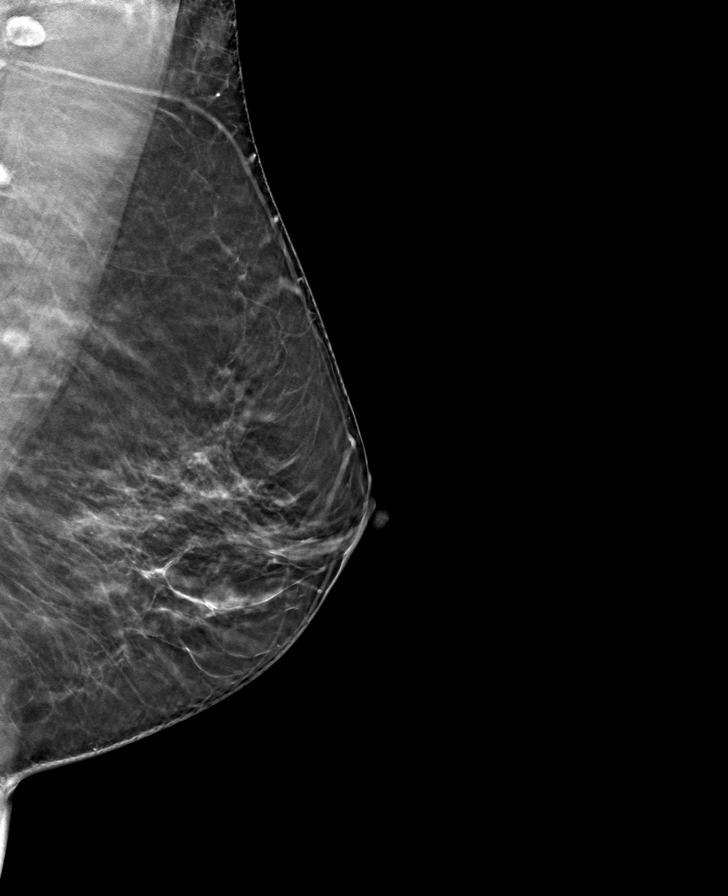

[L CC tomo · tomo slice 31/61.0]
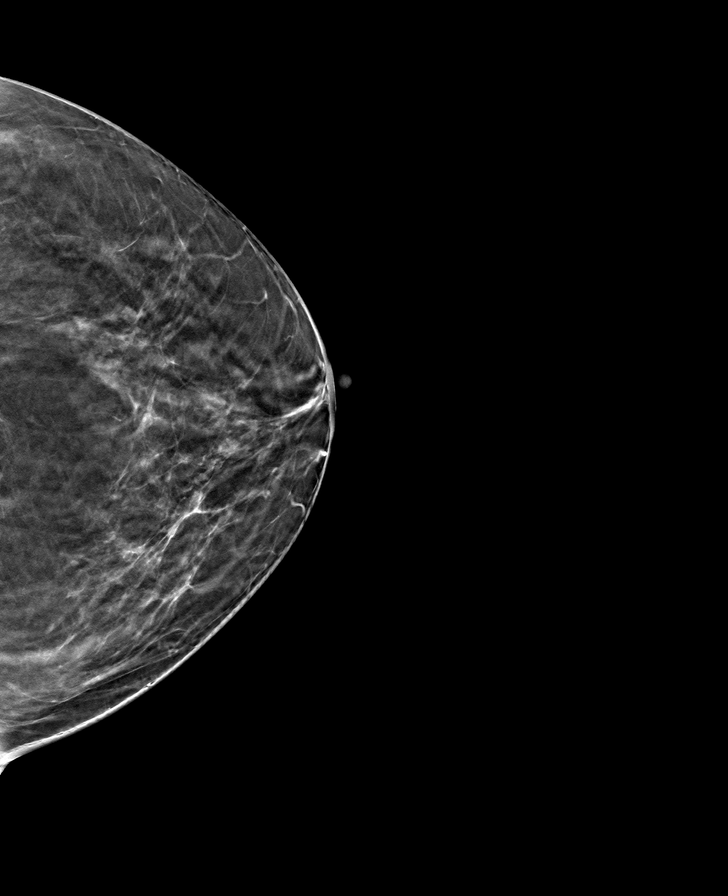

[R MLO tomo · tomo slice 33/64.0]
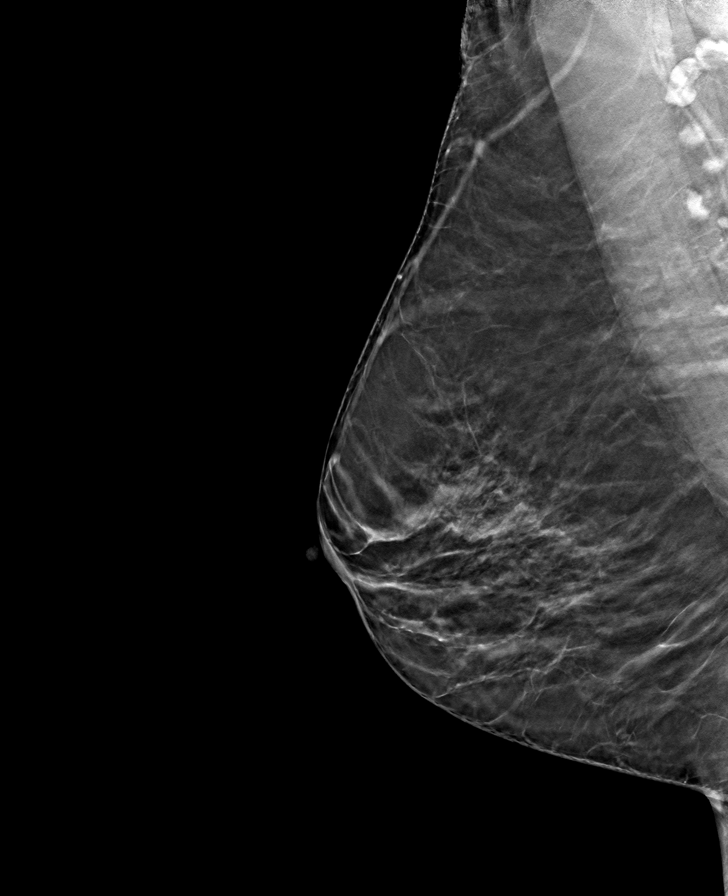

[8 of 24 positions shown; findings below may reference images not displayed]

IMPRESSION: There is no mammographic evidence of malignancy.
Screening mammogram recommended in 1 year.
BI-RADS Category 1: Negative

## 2021-04-20 IMAGING — US US BREAST SCREEN BIL COMP
1 series · 14 of 20 positions shown · non-contrast
Comparison: Multiple prior studies the most recent dated 03/21/2021

INDICATION: Screening.
TECHNIQUE: Real-time ultrasound of both breasts was performed. All four quadrants of the breasts and the retroareolar regions were examined.

[Series 1: us breast screen bil comp · 14 of 20 slices shown]
[im 1/20]
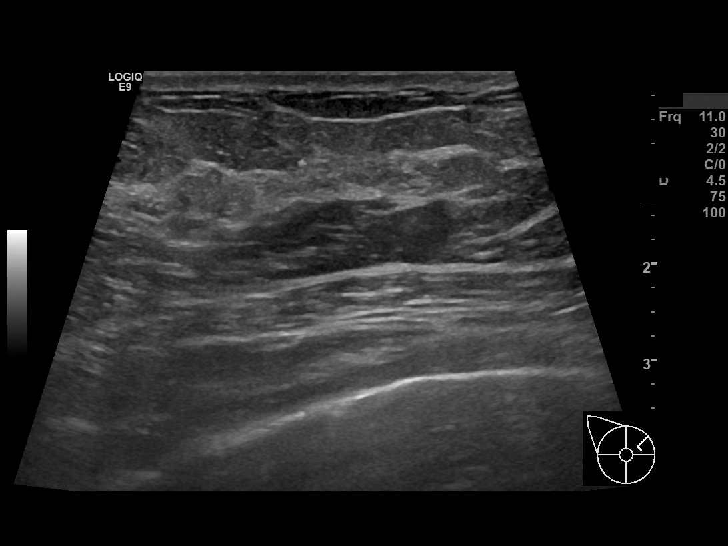
[im 3/20]
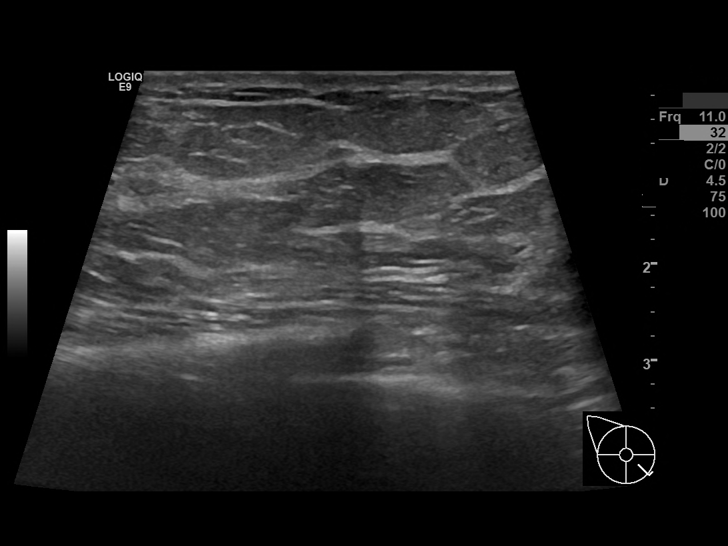
[im 4/20]
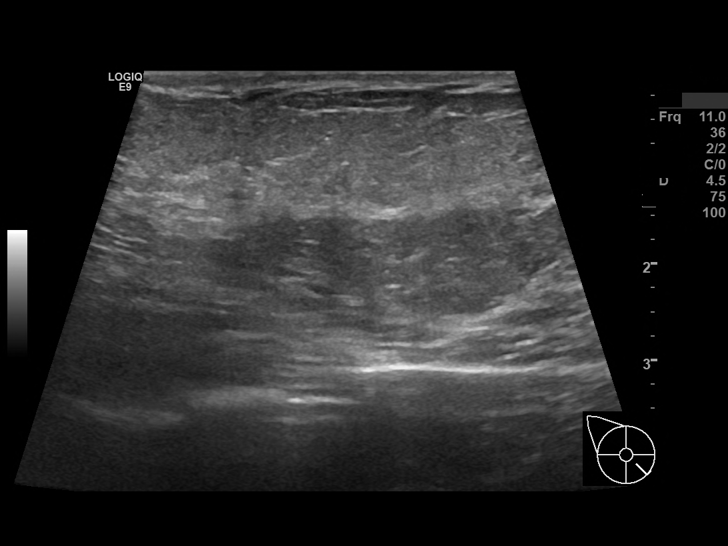
[im 6/20]
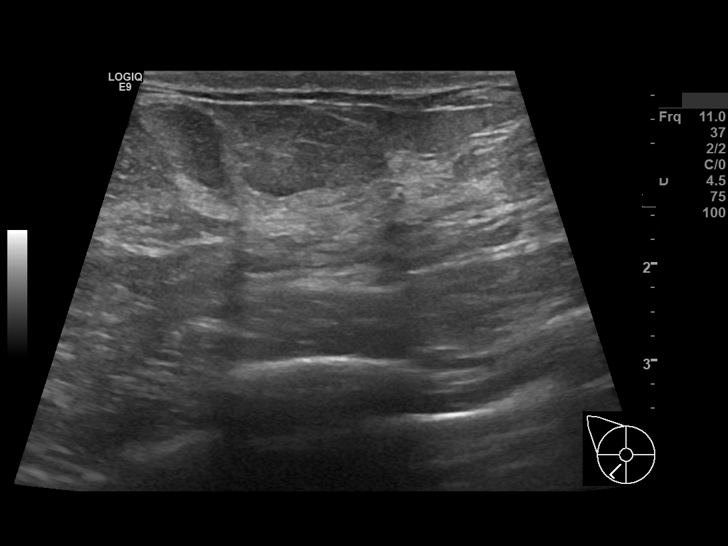
[im 7/20]
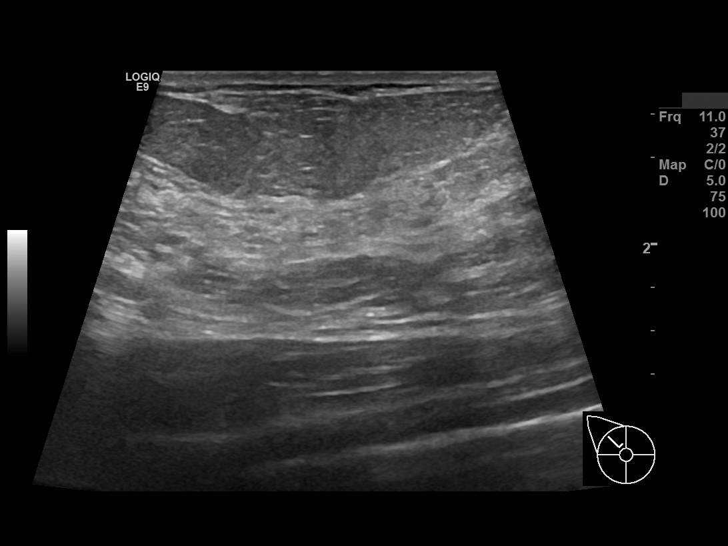
[im 8/20]
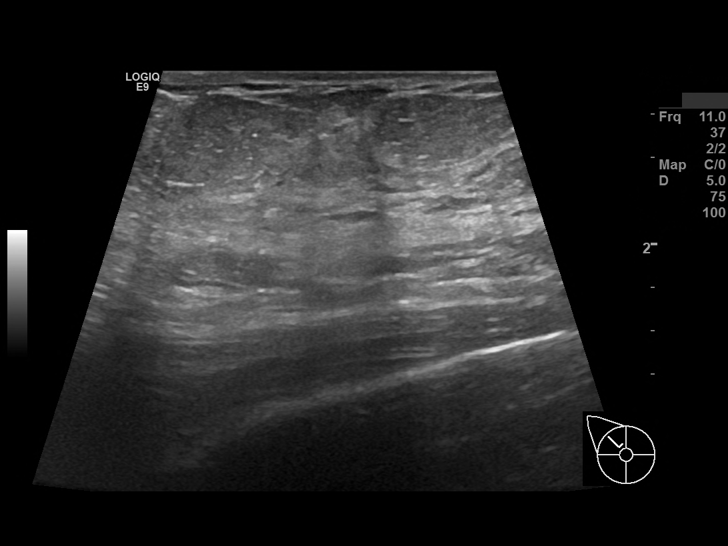
[im 10/20]
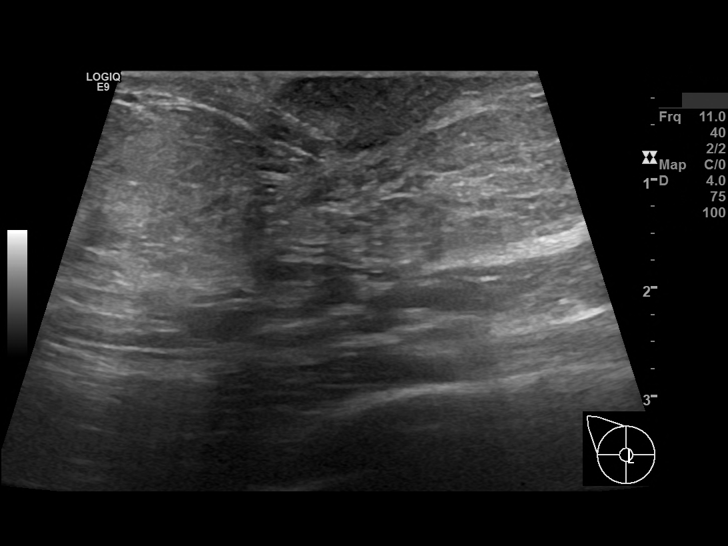
[im 11/20]
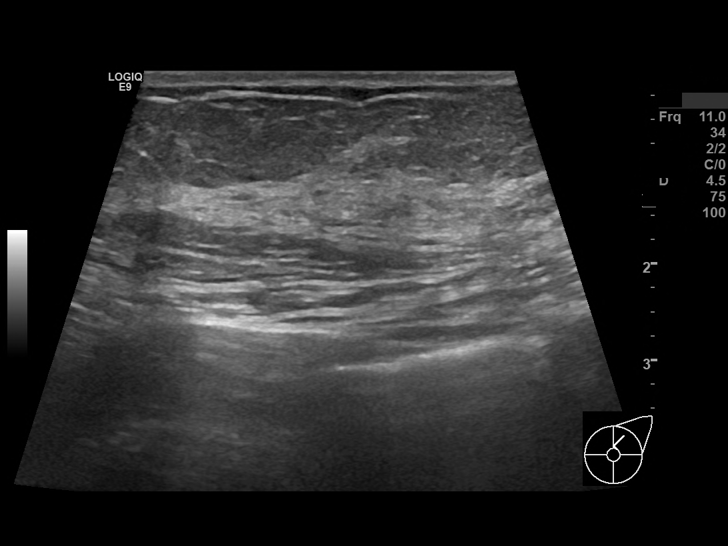
[im 13/20]
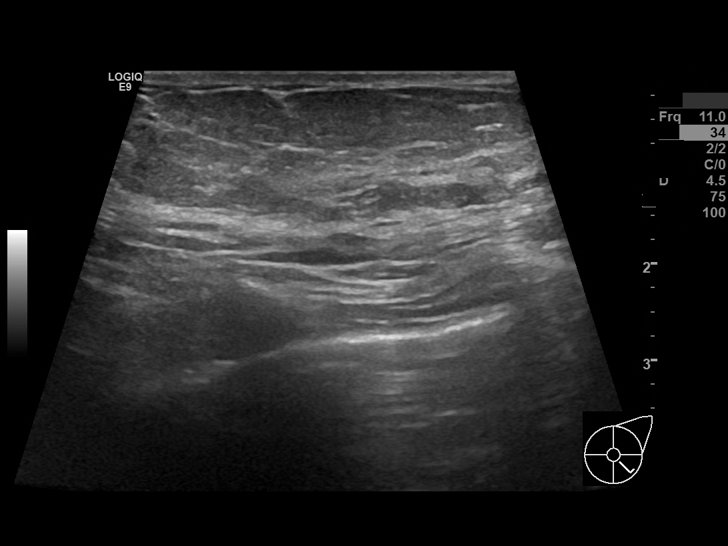
[im 14/20]
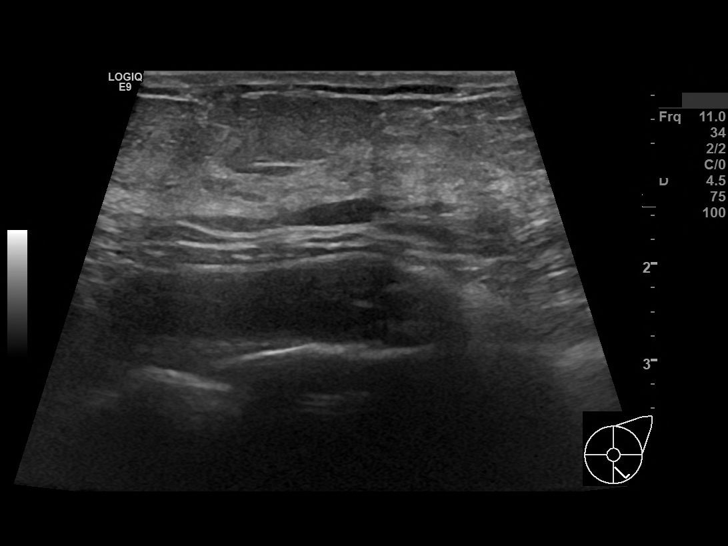
[im 16/20]
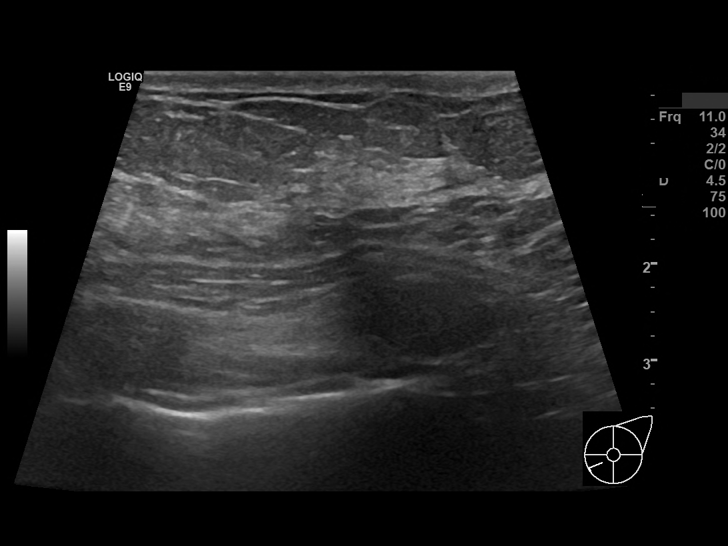
[im 17/20]
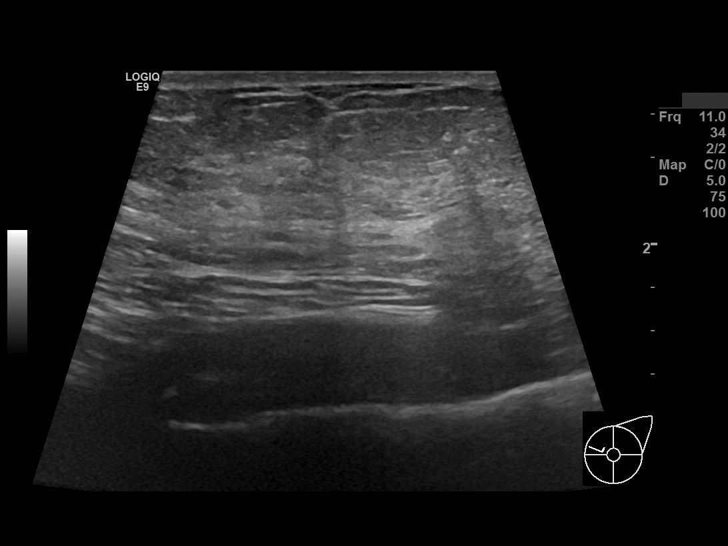
[im 18/20]
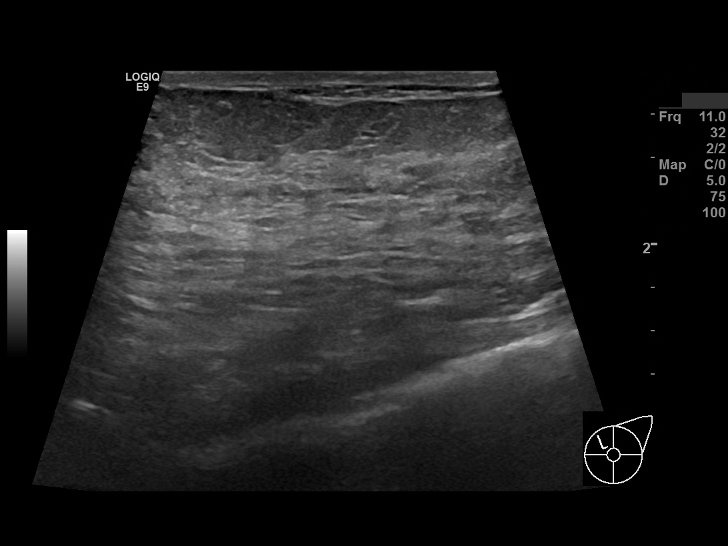
[im 20/20]
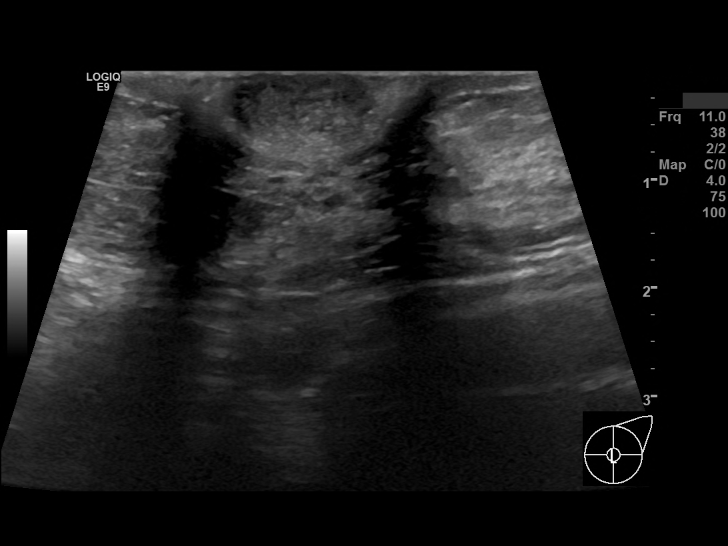

[14 of 20 positions shown; findings below may reference images not displayed]

FINDINGS: No suspicious sonographic abnormality identified in either breast.
IMPRESSION: There is no sonographic evidence of malignancy.  A one year screening mammogram is recommended

FINAL ASSESSMENT: BI-RADS: Category 1 Negative

## 2021-04-30 IMAGING — MR MRI HIP LT WO CONTRAST
5 series · 34 of 40 positions shown · non-contrast
Comparison: MRI of the left hip, 07/29/2020.

INDICATION: Other sprain of left hip, initial encounter, Other injury of unspecified body region, initial encounter, Trochanteric bursitis, left hip
TECHNIQUE: Multiplanar, multisequence imaging of the left hip was performed without contrast.

[Series 2: t1_cor · coronal · left · 4.0mm · 0.66mm/px · 7 of 26 slices shown]
[im 1/26]
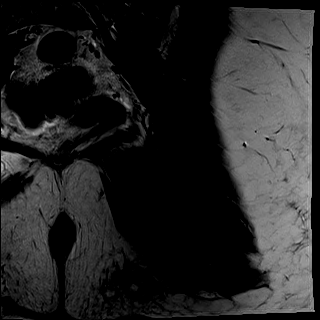
[im 5/26]
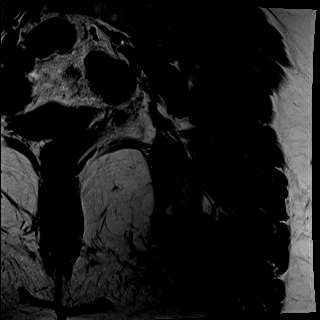
[im 9/26]
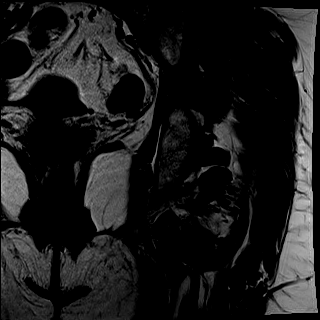
[im 13/26]
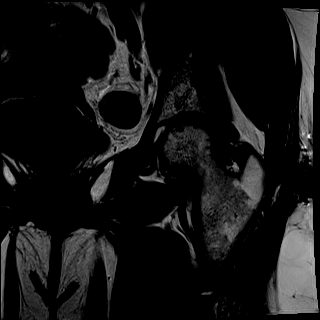
[im 17/26]
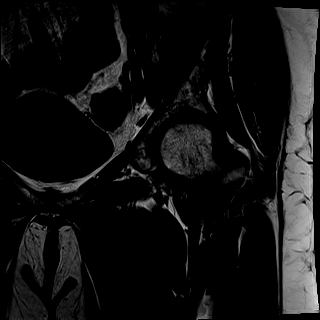
[im 21/26]
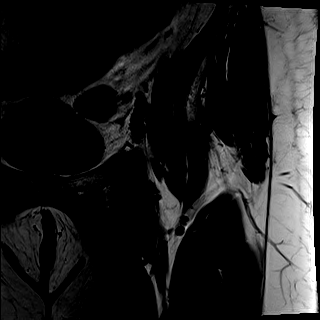
[im 26/26]
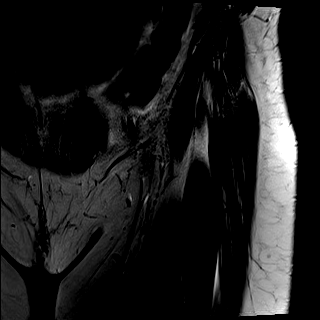

[Series 3: t2_cor_fs · coronal · left · 4.0mm · 0.66mm/px · 7 of 26 slices shown]
[im 1/26]
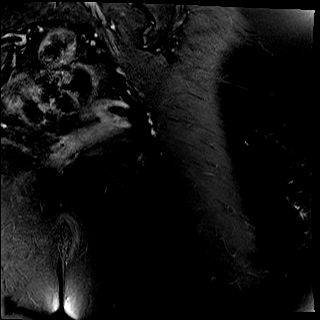
[im 5/26]
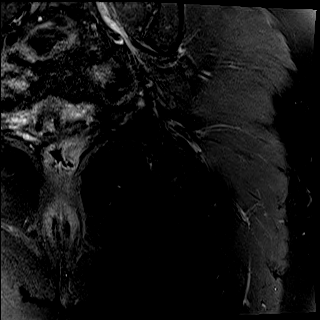
[im 9/26]
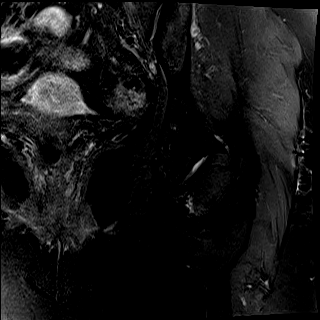
[im 13/26]
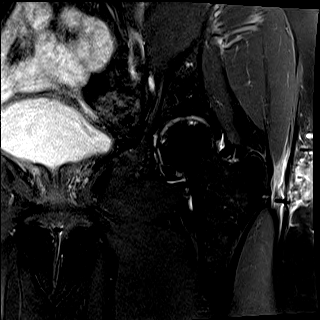
[im 17/26]
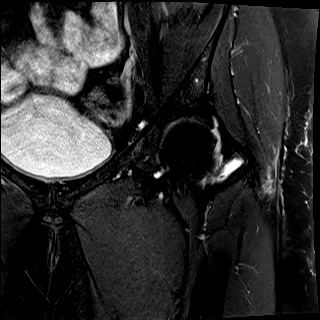
[im 21/26]
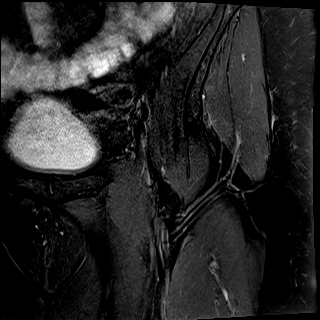
[im 26/26]
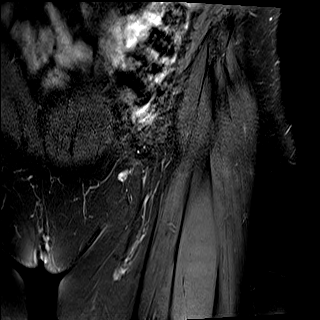

[Series 4: t2_axial_fs · axial · left · 4.0mm · 0.62mm/px · z∈[-67,+108]mm · 8 of 36 slices shown]
[im 1/36]
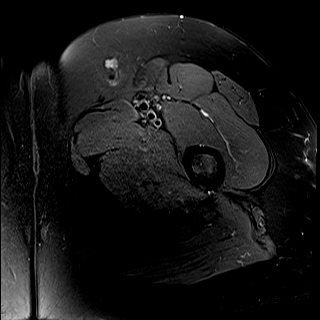
[im 4/36]
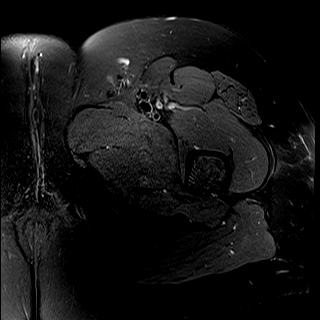
[im 12/36]
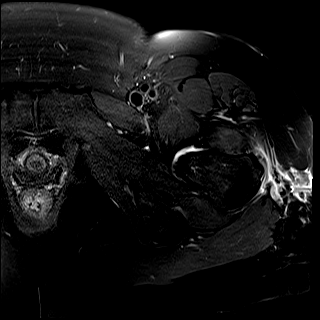
[im 16/36]
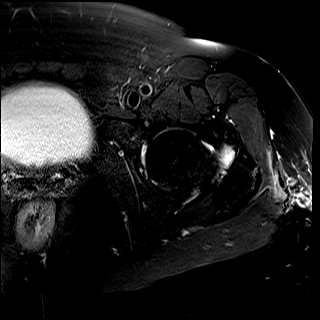
[im 20/36]
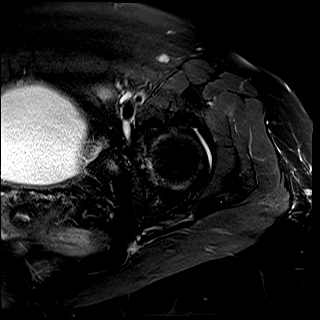
[im 24/36]
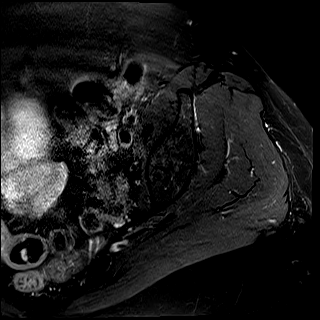
[im 32/36]
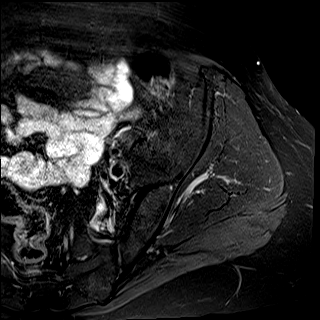
[im 36/36]
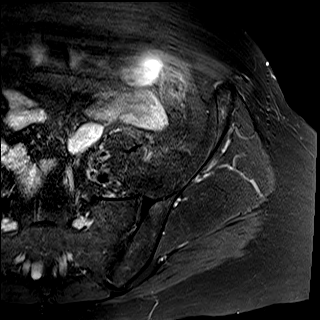

[Series 5: t1_axial · axial · left · 4.0mm · 0.39mm/px · z∈[-67,+108]mm · 8 of 36 slices shown]
[im 1/36]
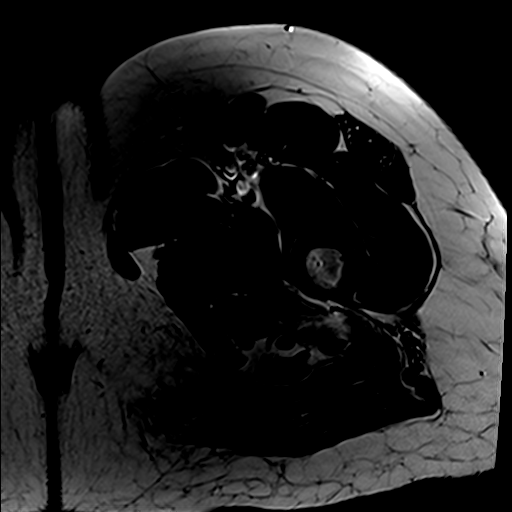
[im 4/36]
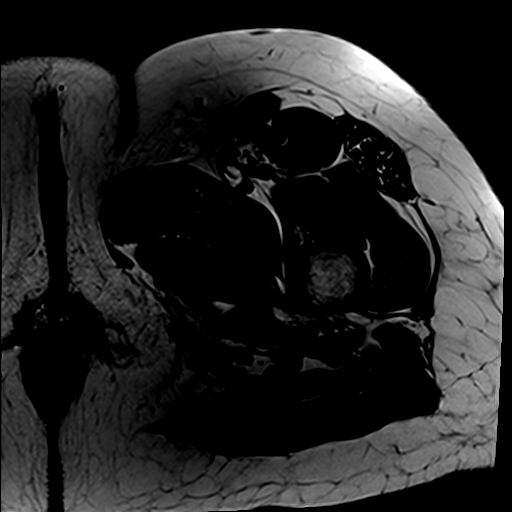
[im 12/36]
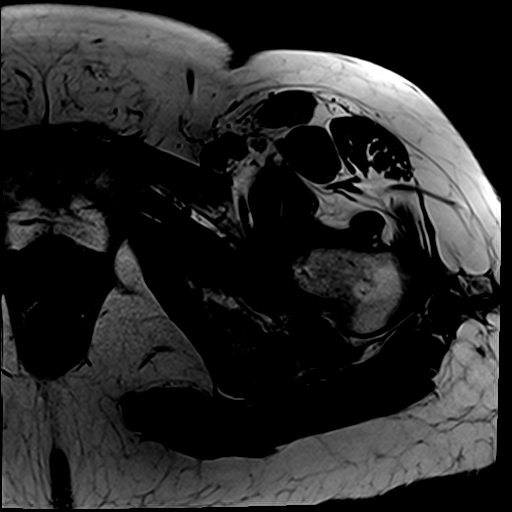
[im 16/36]
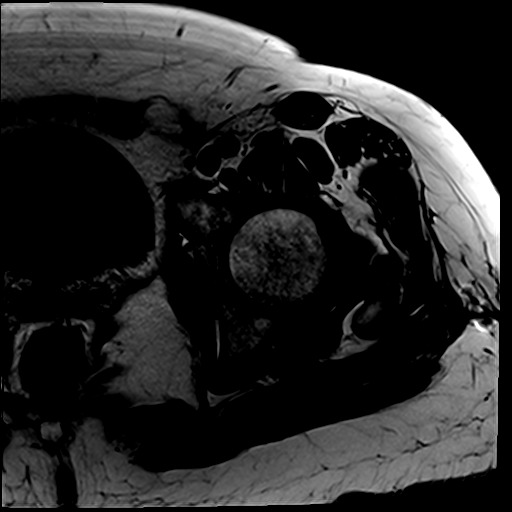
[im 20/36]
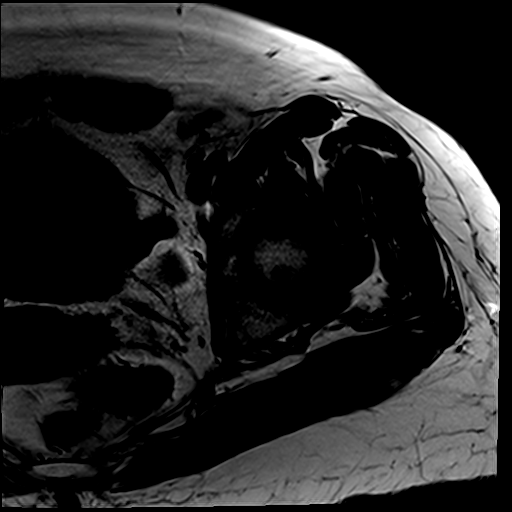
[im 24/36]
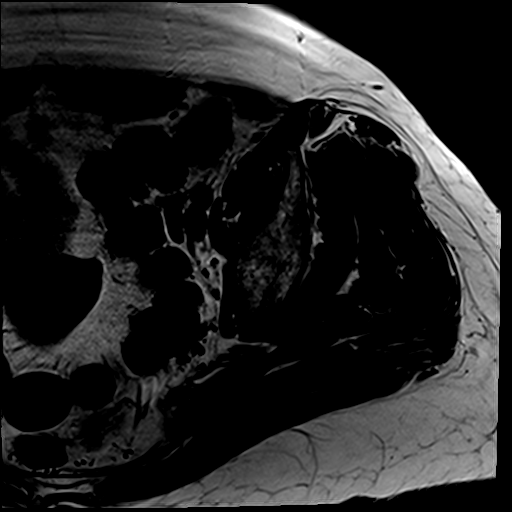
[im 32/36]
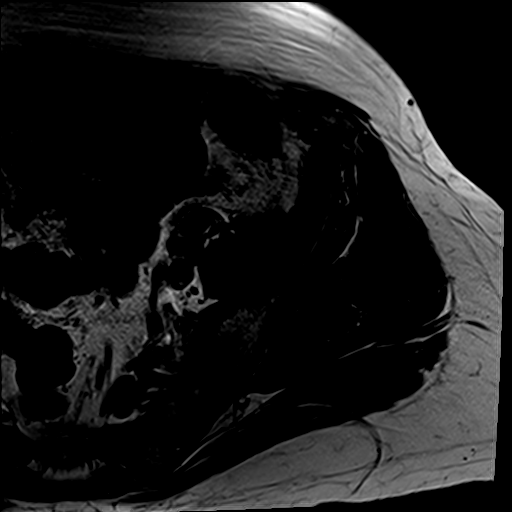
[im 36/36]
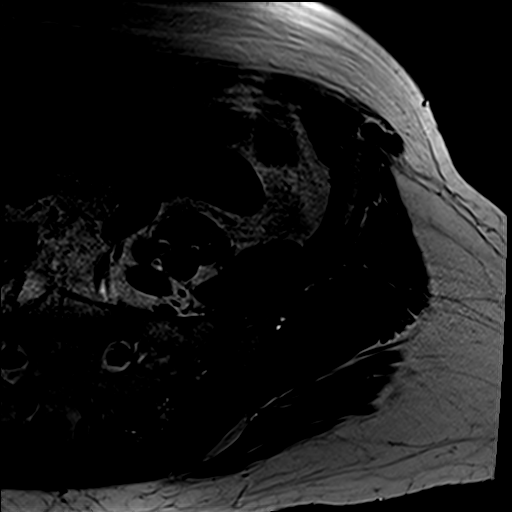

[Series 6: t2_sag_fs · sagittal · left · 4.0mm · 0.62mm/px · 4 of 24 slices shown]
[im 1/24]
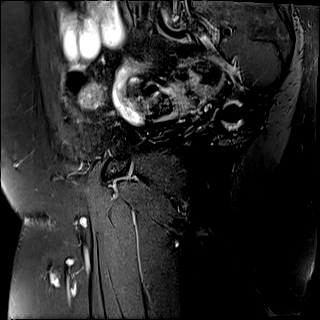
[im 5/24]
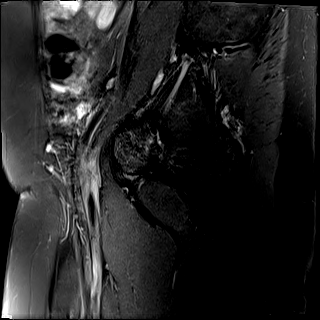
[im 10/24]
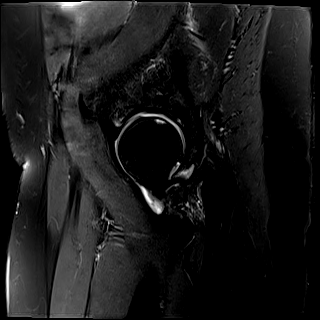
[im 14/24]
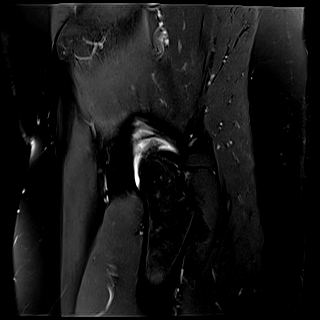

[34 of 40 positions shown; findings below may reference images not displayed]

FINDINGS: OSSEOUS: No acute fracture, avascular necrosis or aggressive osseous lesion.

HIP JOINT: 

Labrum: Degeneration and fraying of the anterior superior acetabular labrum, new from prior examination.

Articular cartilage: Progression of irregular and deep partial-thickness chondrosis of the superior posterior superior femoroacetabular articular cartilage, with associated reactive subchondral marrow edema in the femoral head, new from prior examination.

Effusion: Small volume fluid.

GLUTEAL TENDONS AND GREATER TROCHANTERIC BURSA:

Gluteus medius tendon: Intact.

Gluteus minimus tendon: Intact.

Greater trochanteric bursa: Interval postsurgical changes of the lateral aspect of the hip, along the greater trochanteric bursa. Correlate with surgical history.

OTHER MUSCULOTENDINOUS STRUCTURES:

Rectus femoris tendon: Intact.

Iliopsoas tendon: Intact.

Proximal hamstring tendon origin: Intact.

Intramuscular edema and fatty atrophy: None.

SCIATIC NERVE: The visualized course and caliber of the sciatic nerve is unremarkable.

OTHER: The visualized intrapelvic contents are unremarkable.
IMPRESSION: 1.
Interval progression of moderate hip chondrosis, with degeneration and fraying of the anterior superior acetabular labrum.

2.
Interval postsurgical changes along the lateral aspect of the hip, along the greater trochanteric bursa. Correlate with surgical history.

## 2021-05-25 IMAGING — MR MRI HIP RT WO CONTRAST
4 of 5 series · 24 of 40 positions shown · non-contrast
Comparison: MRI of the right hip 08/18/2020

INDICATION: Trochanteric bursitis right hip, primary osteoarthritis of hips
TECHNIQUE: Multiplanar, multisequence imaging of the right hip was performed without contrast.

[Series 2: t1_cor · coronal · right · 4.0mm · 0.66mm/px · 6 of 24 slices shown]
[im 1/24]
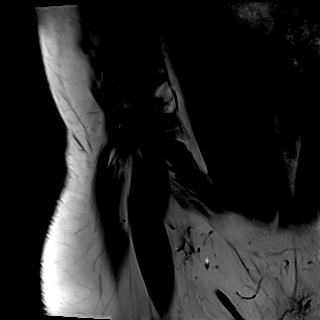
[im 5/24]
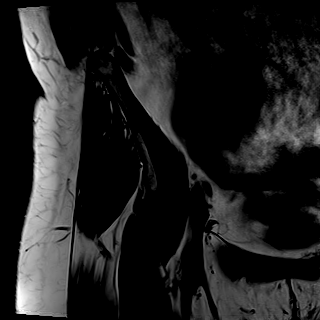
[im 10/24]
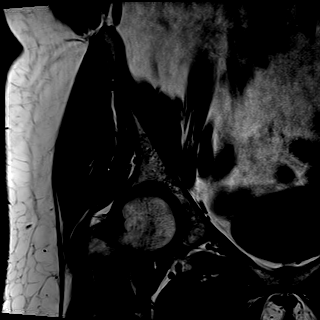
[im 14/24]
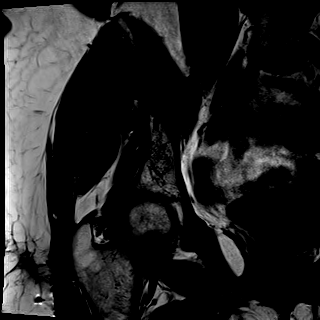
[im 19/24]
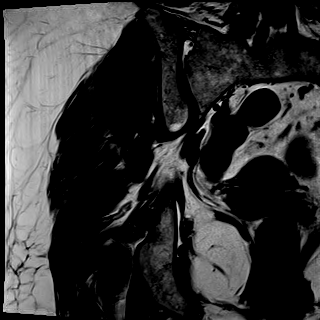
[im 24/24]
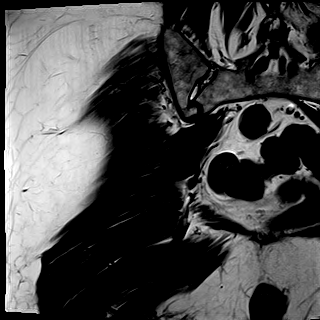

[Series 3: t2_cor_fs · coronal · right · 4.0mm · 0.73mm/px · 6 of 24 slices shown]
[im 1/24]
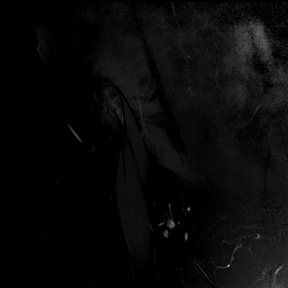
[im 5/24]
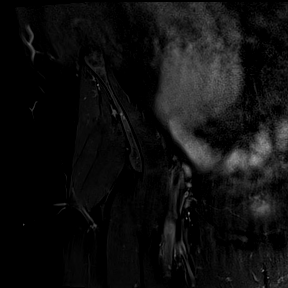
[im 10/24]
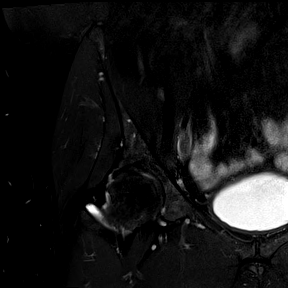
[im 14/24]
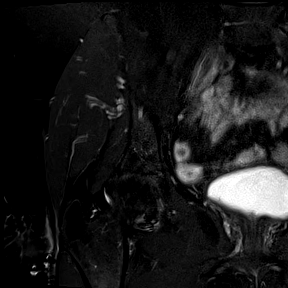
[im 19/24]
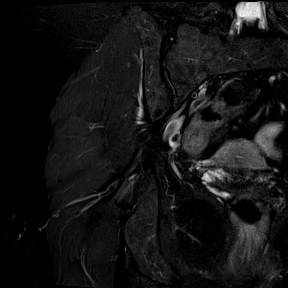
[im 24/24]
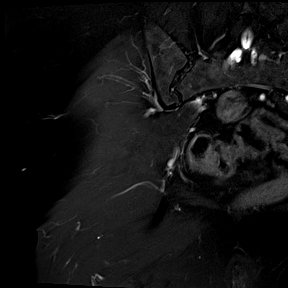

[Series 4: t1_axial · axial · right · 4.0mm · 0.35mm/px · z∈[-92,+83]mm · 9 of 40 slices shown]
[im 1/40]
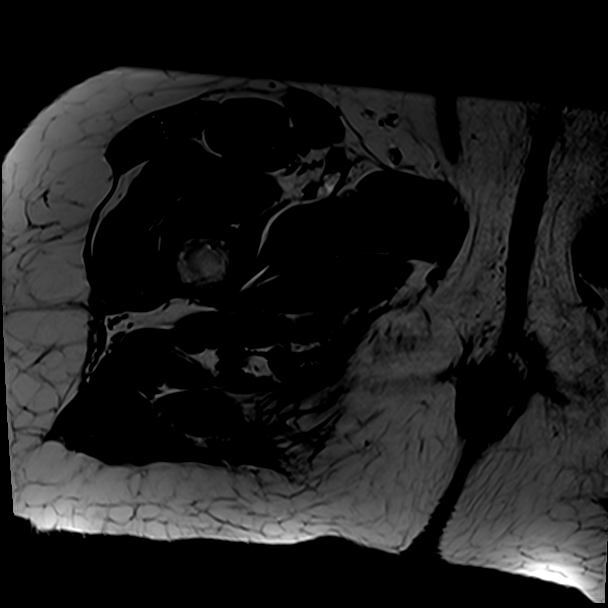
[im 4/40]
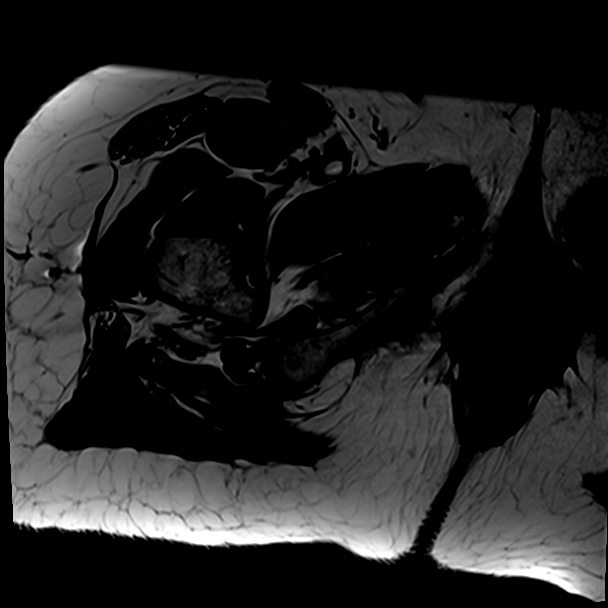
[im 8/40]
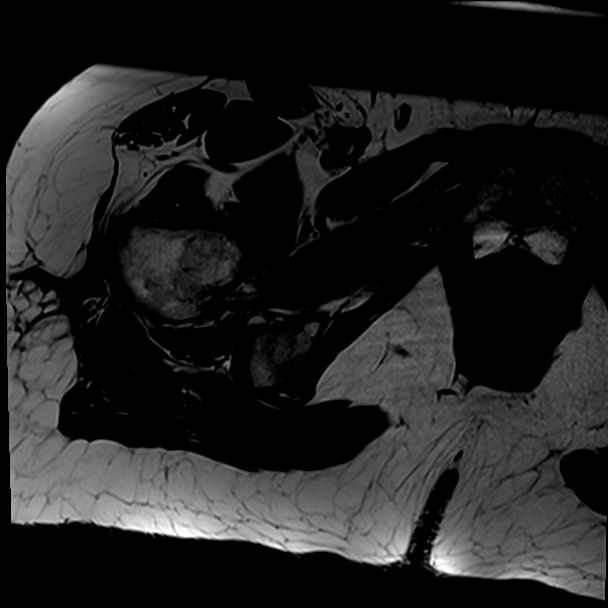
[im 12/40]
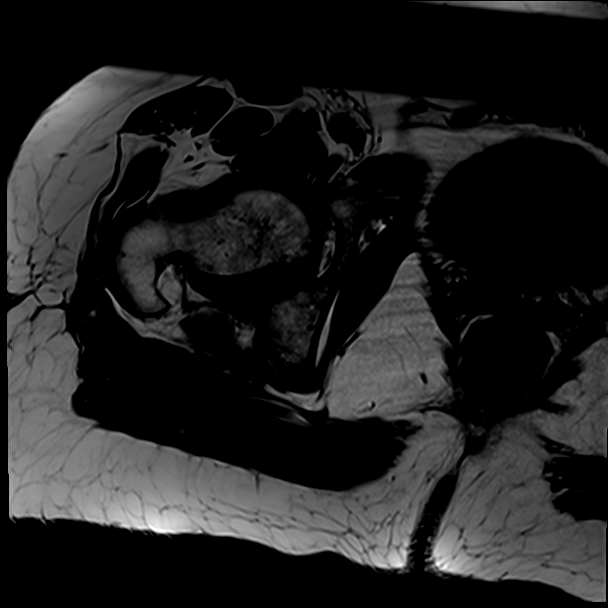
[im 16/40]
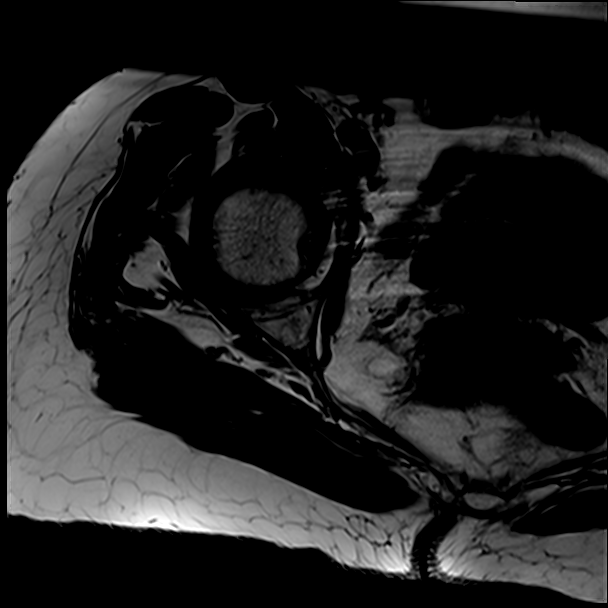
[im 20/40]
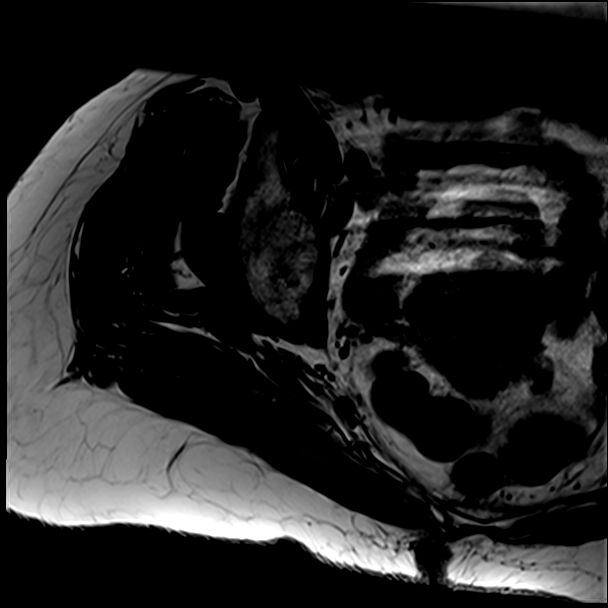
[im 24/40]
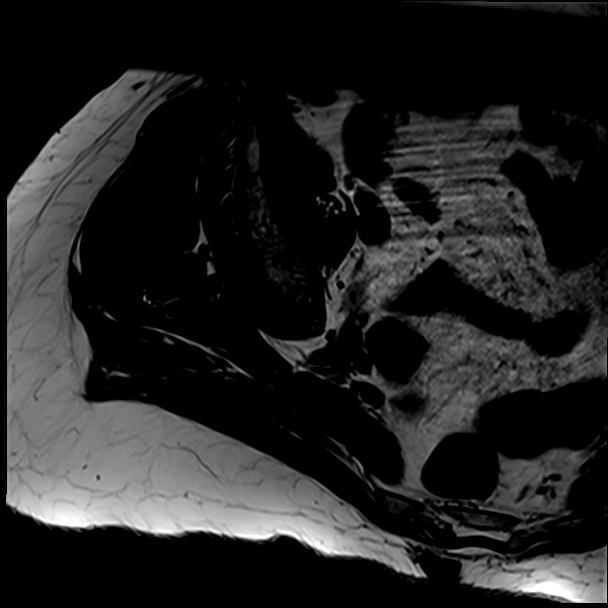
[im 28/40]
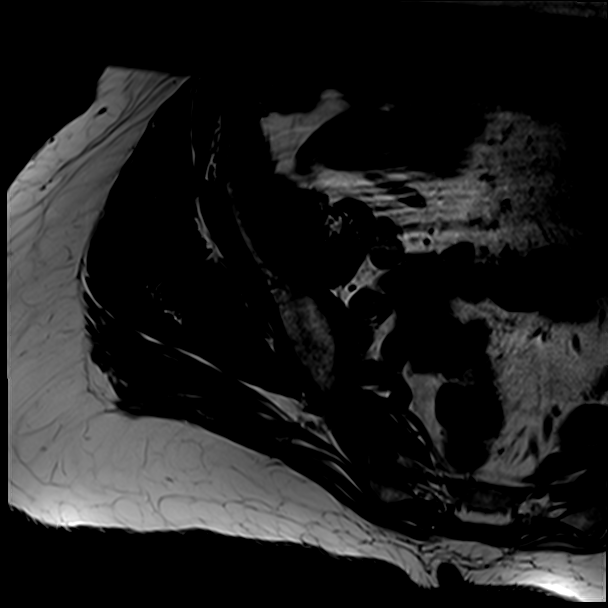
[im 36/40]
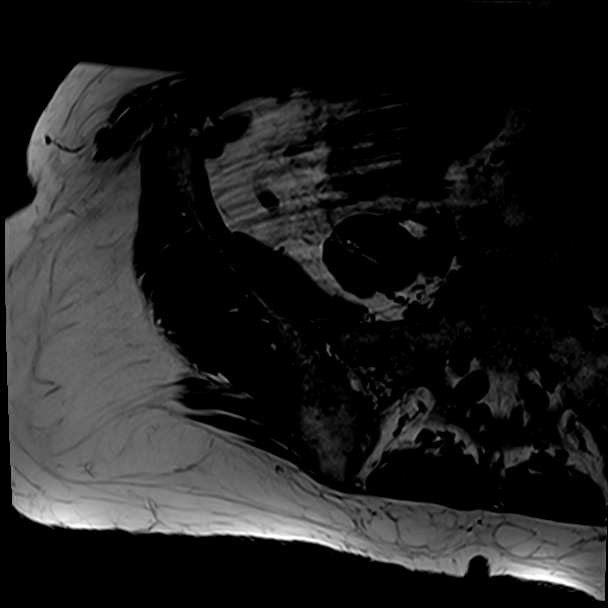

[Series 6: t2_sag_fs · sagittal · right · 4.0mm · 0.69mm/px · 3 of 22 slices shown]
[im 5/22]
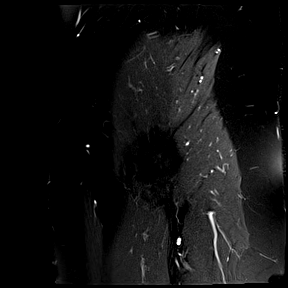
[im 13/22]
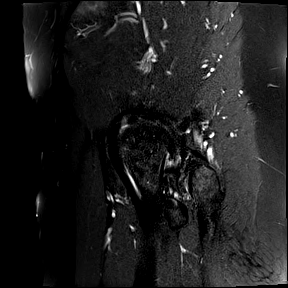
[im 22/22]
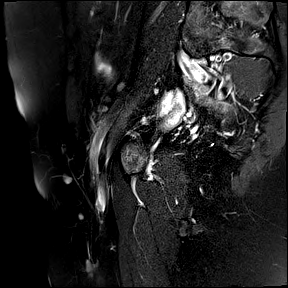

[24 of 40 positions shown; findings below may reference images not displayed]

FINDINGS: OSSEOUS: No acute fracture, avascular necrosis or aggressive osseous lesion.

HIP JOINT: 

Labrum: Redemonstration of tearing of the anterior superior acetabular labrum.

Articular cartilage: Redemonstration of mild chondral thinning of the anterior superior femoroacetabular articular cartilage, with superimposed deep partial-thickness chondrosis of the superior femoroacetabular articular cartilage. These findings are similar appearance to prior examination.

Effusion: Small volume hip joint fluid.

GLUTEAL TENDONS AND GREATER TROCHANTERIC BURSA:

Gluteus medius tendon: Intact.

Gluteus minimus tendon: Intact.

Greater trochanteric bursa: Interval postsurgical changes within the soft tissues overlying the lateral aspect of the greater trochanter, in the region of the greater trochanteric bursa. Small volume fluid is present within the operative bed.

OTHER MUSCULOTENDINOUS STRUCTURES:

Rectus femoris tendon: Intact.

Iliopsoas tendon: Intact.

Proximal hamstring tendon origin: Intact.

Intramuscular edema and fatty atrophy: Redemonstration of narrowing of the ischiofemoral space, with moderate fatty atrophy of the quadratus femoris muscle.

SCIATIC NERVE: The visualized course and caliber of the sciatic nerve is unremarkable.

OTHER: The visualized intrapelvic contents are unremarkable.
IMPRESSION: 1.
Interval postsurgical changes of the soft tissues overlying the lateral aspect of the greater trochanter, with small volume fluid within the operative bed.

2.
Unchanged mild right hip chondrosis, with tearing of the anterior superior acetabular labrum.

3.
Redemonstration of narrowing of the ischiofemoral space, with moderate fatty atrophy of the quadratus femoris muscle. Correlate with clinical signs of ischiofemoral impingement.

## 2021-07-16 IMAGING — CT CT LOW DOSE LUNG SCREENING
2 of 5 series · 11 of 36 positions shown, 13 images · non-contrast
Comparison: None

HISTORY/INDICATION:  Tobacco use. Current smoker. 1 pack per day x 38 years
TECHNIQUE: Scans of the chest are performed in the transaxial plane, reconstructed at 2 mm increments, without IV contrast using a low-dose scanning protocol. Coronal and sagittal lung reconstructions are also obtained.  Siemens nodule finding program was used to assist pulmonary nodule detection. 

Dose reduction technique used: Automated exposure control and adjustment of the mA and/or kV according to patient size. CT count in previous 12-months:  0

[Series 4: lung · axial · 0.54mm/px · z∈[+1545,+1809]mm · 8 of 168 slices shown, 10 images]
[im 18/168  mediastinal]
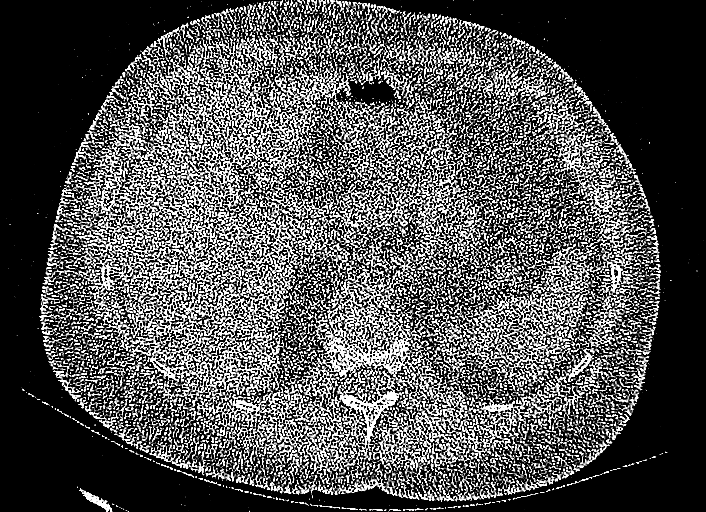
[im 18/168  lung]
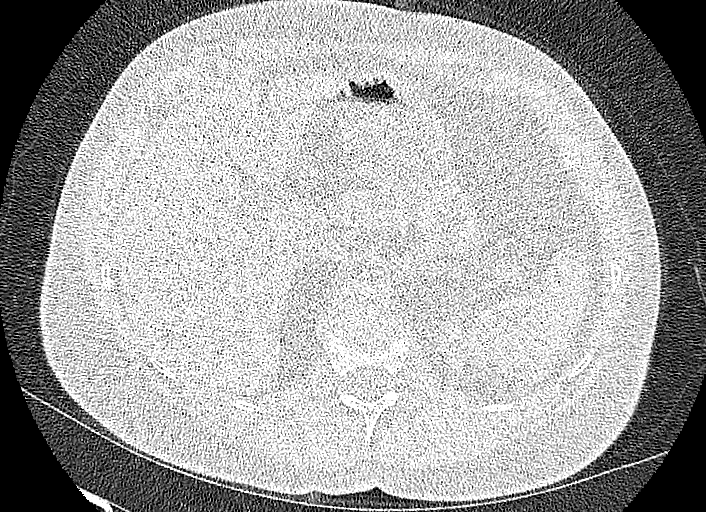
[im 36/168  lung]
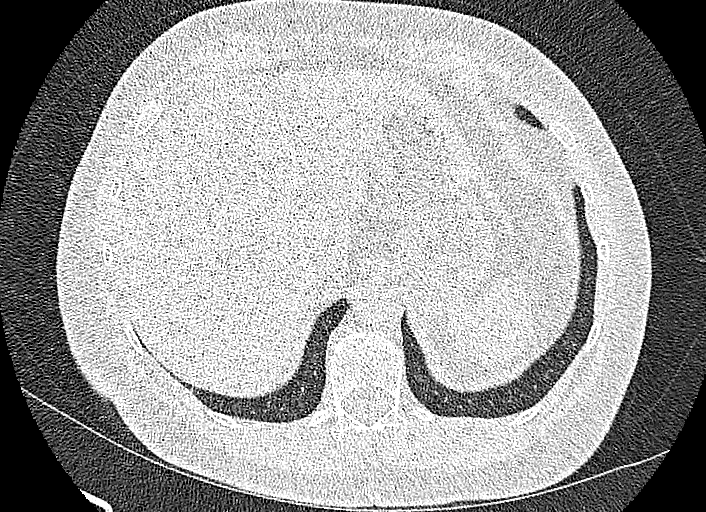
[im 53/168  lung]
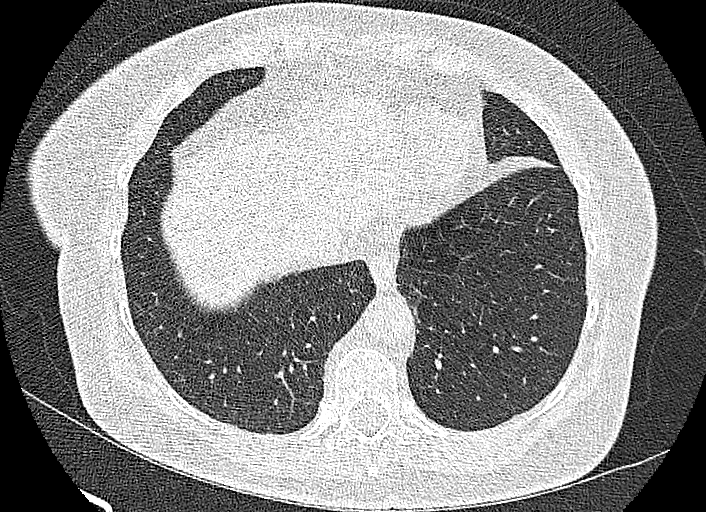
[im 71/168  lung]
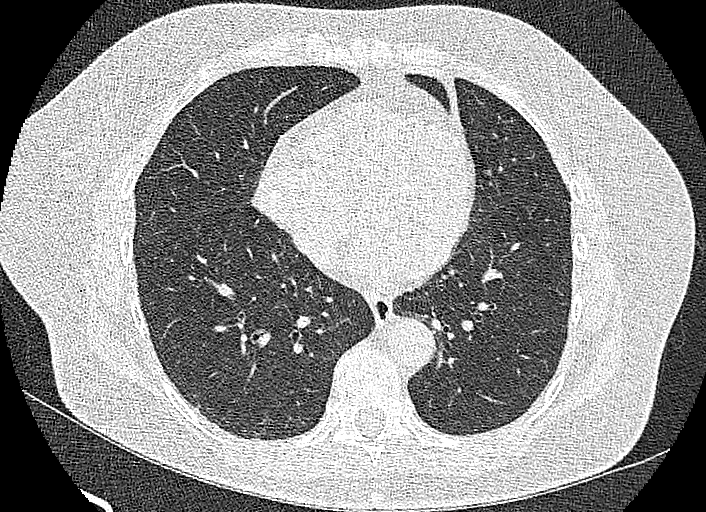
[im 97/168  mediastinal]
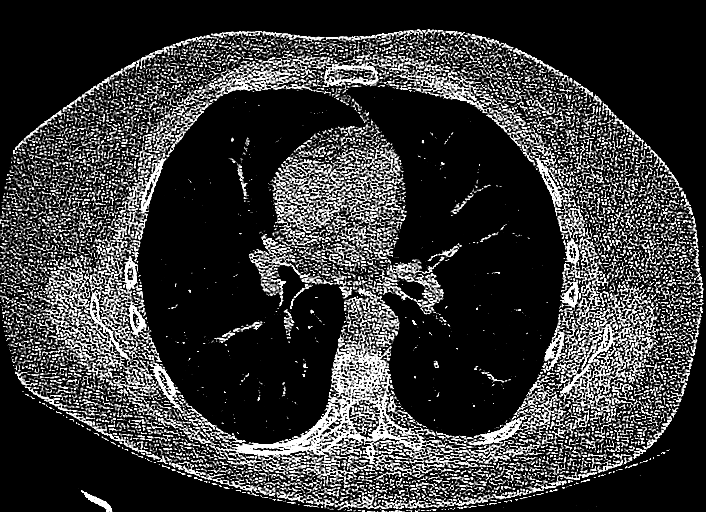
[im 97/168  lung]
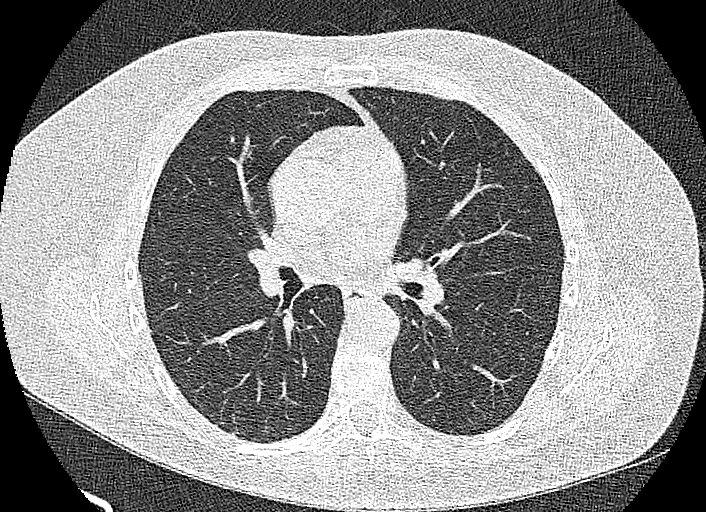
[im 115/168  lung]
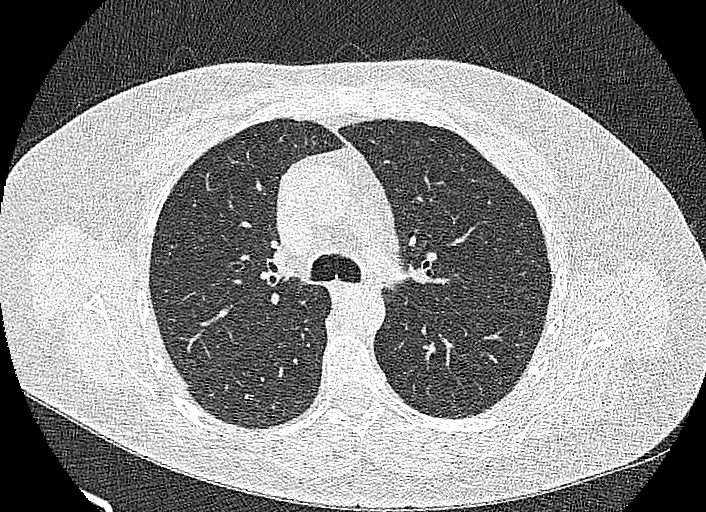
[im 132/168  lung]
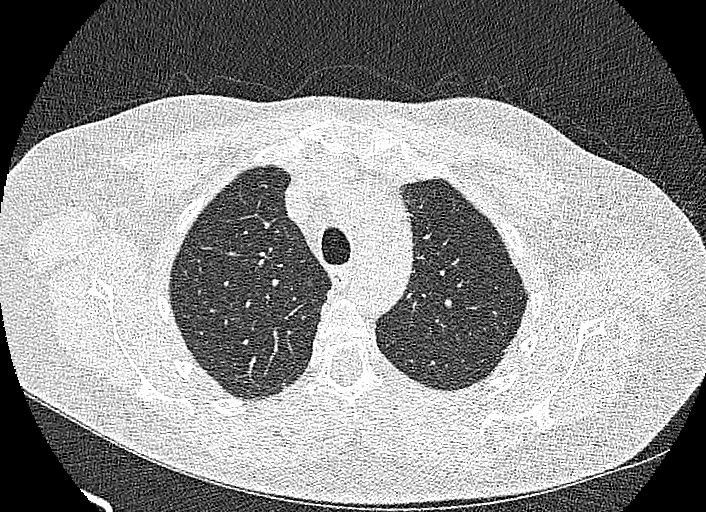
[im 150/168  lung]
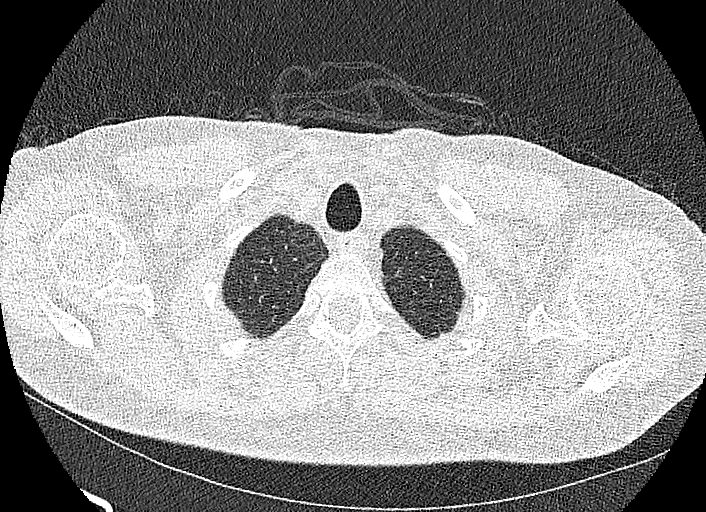

[Series 6: coronal · coronal · 0.66mm/px · 3 of 137 slices shown]
[im 28/137  lung]
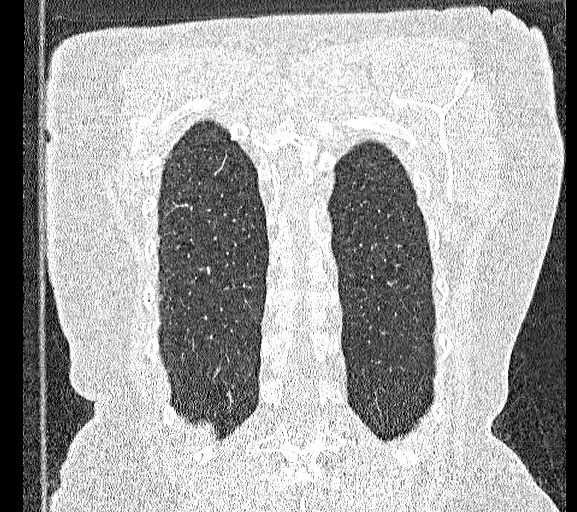
[im 55/137  lung]
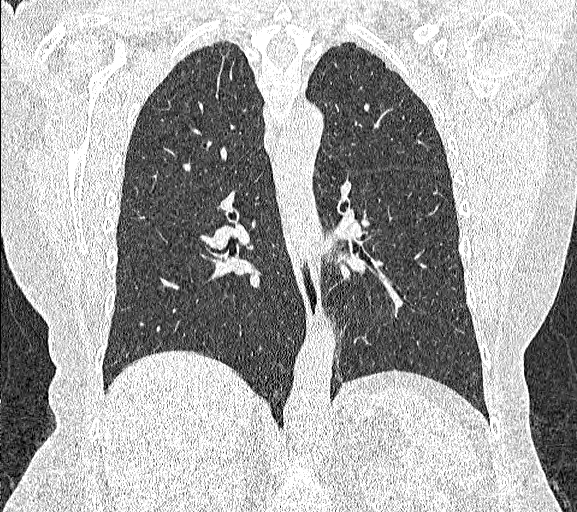
[im 82/137  lung]
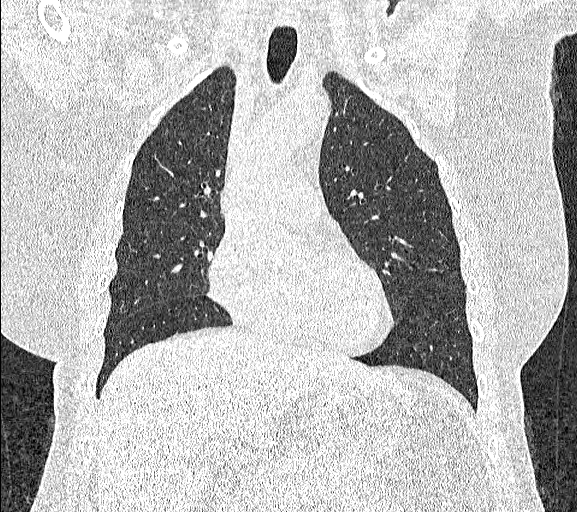

[11 of 36 positions shown; findings below may reference images not displayed]

FINDINGS: Heart size and signal. No pericardial effusion. Normal thoracic aorta. Normal pulmonary vasculature. No mediastinal, hilar, or axillary lymphadenopathy. Esophagus is unremarkable. No pneumothorax. No pleural effusion. No focal consolidation. No suspicious pulmonary nodule.

Moderate lower thoracic spine degenerative changes. Visualized upper abdominal structures are unremarkable.
IMPRESSION: 1.  Lung Rads Category 1, negative examination. No pulmonary nodule.

2.  Incidental moderate lower thoracic spine degenerative changes.

RECOMMENDATION:  

If there is no history of tobacco usage, no further workup necessary.

If there is significant history of tobacco use, continue annual screening with low dose CT.

Total radiation dose to patient is CTDIvol 0.53 mGy and DLP 20.10 mGy-cm.

[URL]

## 2021-08-31 IMAGING — US US BREAST RT LTD
1 series · 10 of 10 positions shown · non-contrast
Comparison: Multiple prior exams most recently 04/20/2021

INDICATION: Right breast lump.
TECHNIQUE: Real-time ultrasound of the right breast was performed.

[Series 1: us breast right ltd · 10 of 10 slices shown]
[im 1/10]
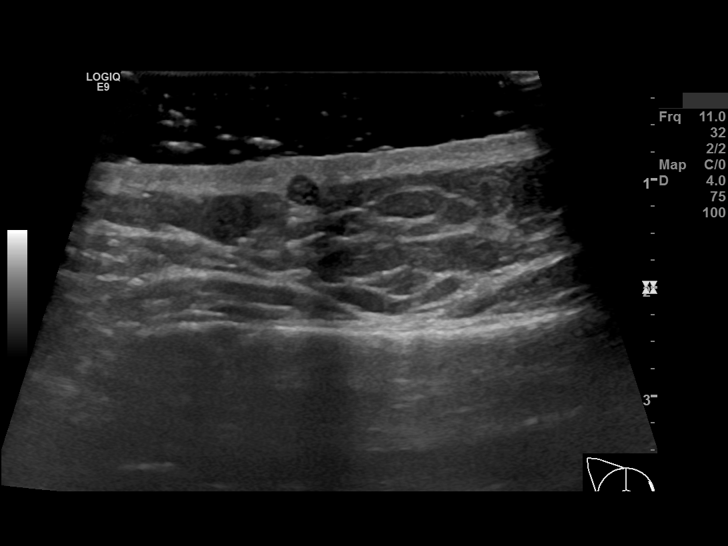
[im 2/10]
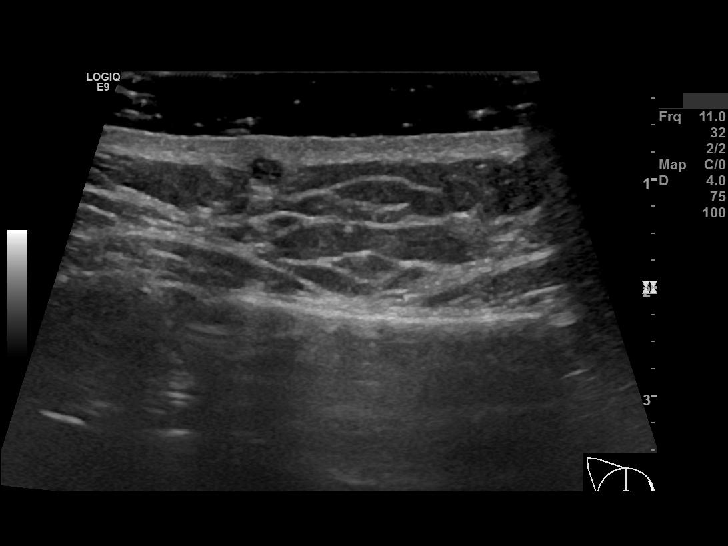
[im 3/10]
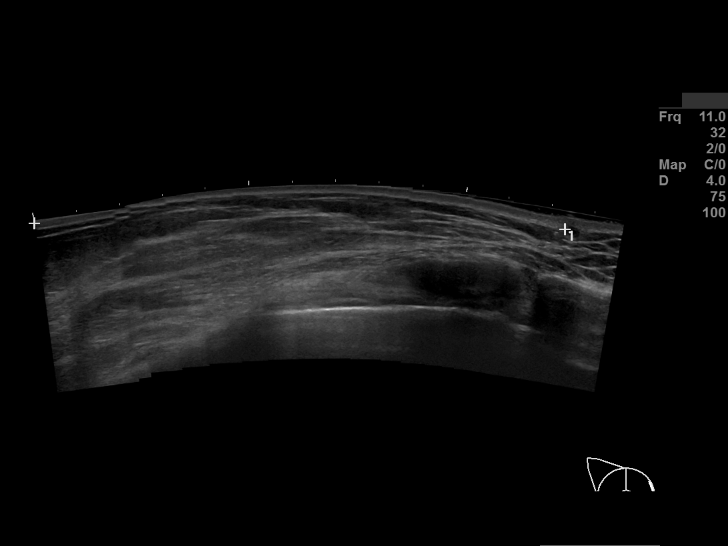
[im 4/10]
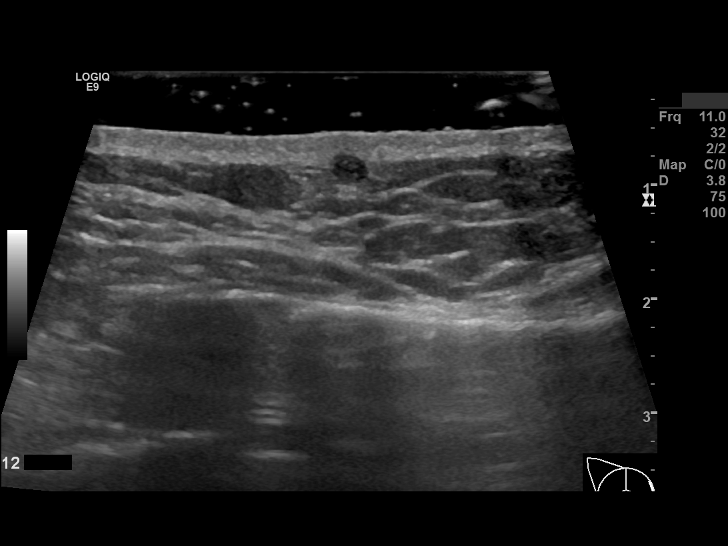
[im 5/10]
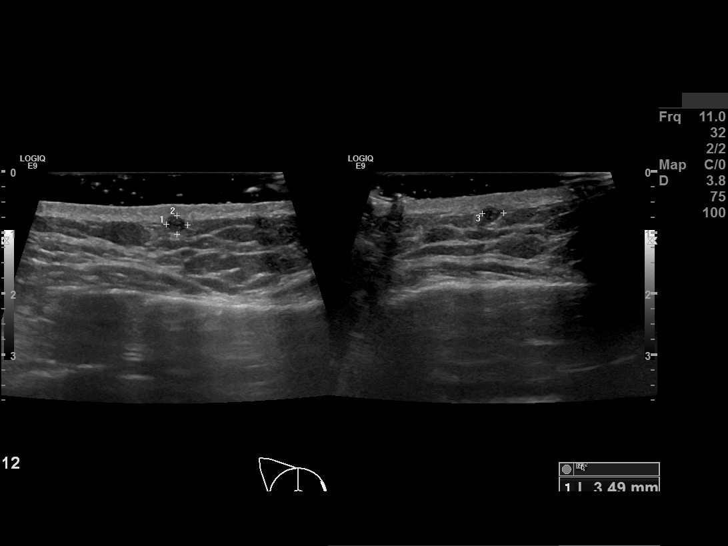
[im 6/10]
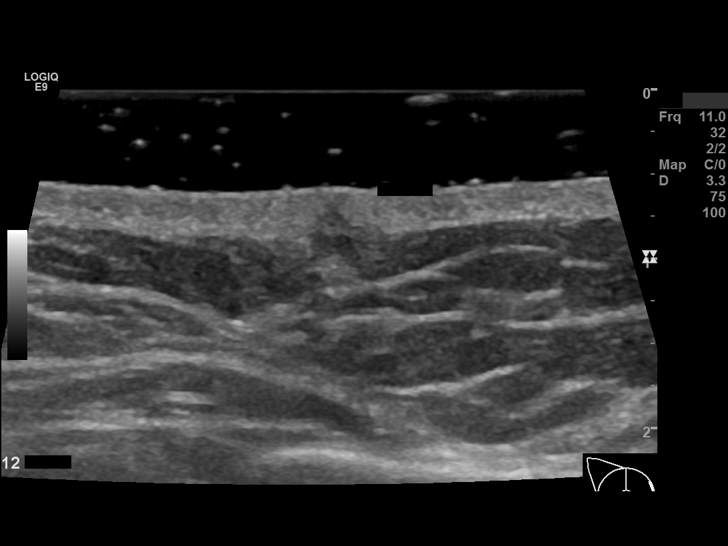
[im 7/10]
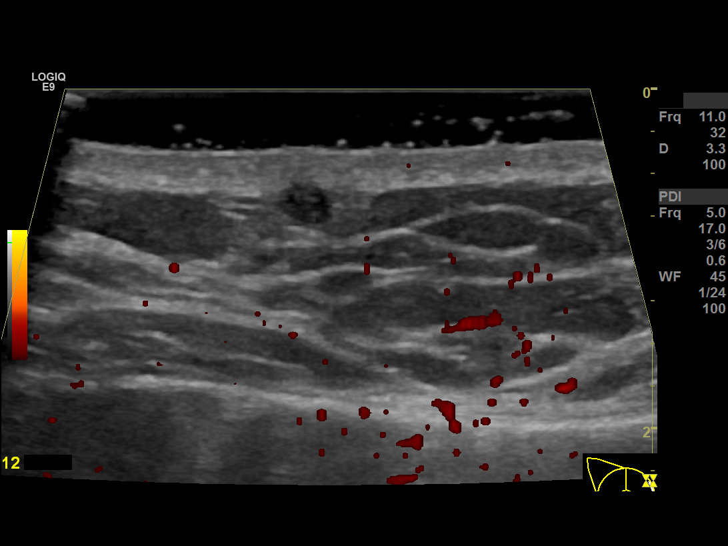
[im 8/10]
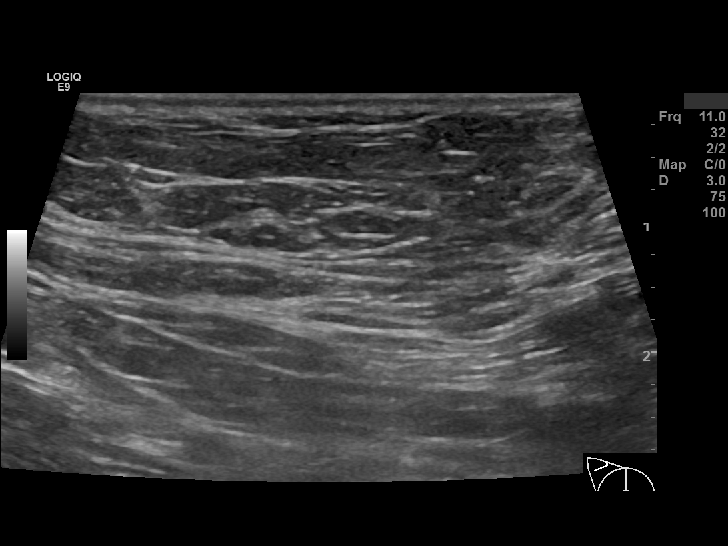
[im 9/10]
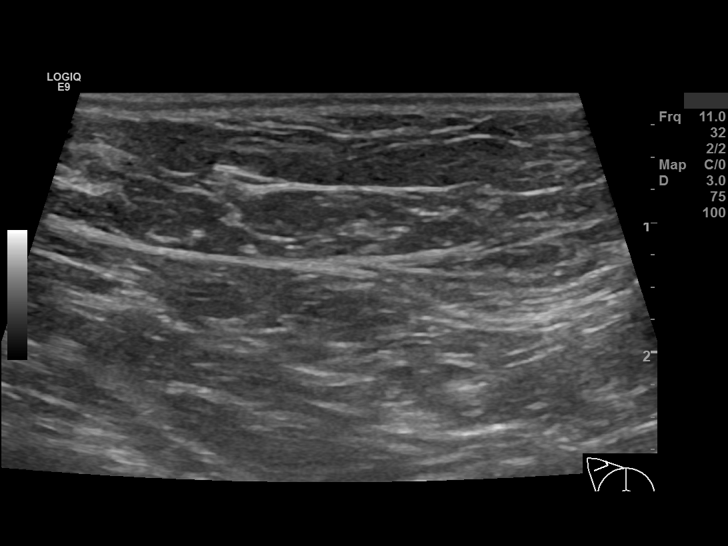
[im 10/10]
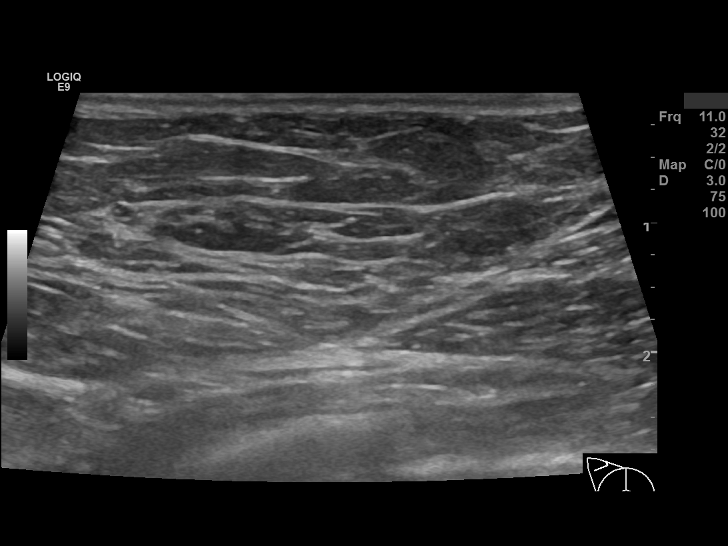

[10 of 10 positions shown; findings below may reference images not displayed]

FINDINGS: In the area of palpable concern in the right breast 12 o'clock 3 cm from the nipple there is an avascular hypoechoic collection within the skin measuring 0.4 x 0.3 cm consistent with a benign sebaceous cyst. A skin tract was noted. No enlarged right axillary lymph nodes.
IMPRESSION: There is no sonographic evidence of malignancy. Follow-up with annual screening mammogram.

The palpable area of concern in the right breast 3 o'clock corresponds to a benign 0.4 cm sebaceous cyst. Clinical follow-up recommended.

The patient received a copy of the results at the end of the examination. 

FINAL ASSESSMENT: BI-RADS: Category 2 Benign

## 2021-11-15 IMAGING — MR MRI HIP RT WO CONTRAST
4 of 5 series · 25 of 40 positions shown · non-contrast
Comparison: MRI of the right hip, 05/25/2021.

Images Obtained from [facility]
INDICATION: Chronic pain in right hip
TECHNIQUE: Multiplanar, multisequence imaging of the right hip was performed without contrast.

[Series 3: t1_cor · coronal · right · 4.0mm · 0.66mm/px · 6 of 24 slices shown]
[im 1/24]
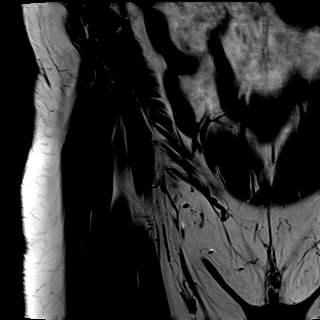
[im 5/24]
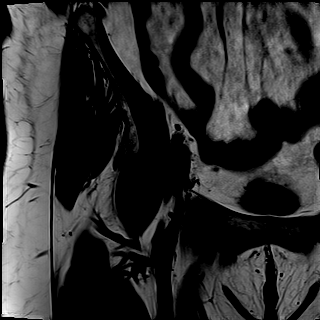
[im 10/24]
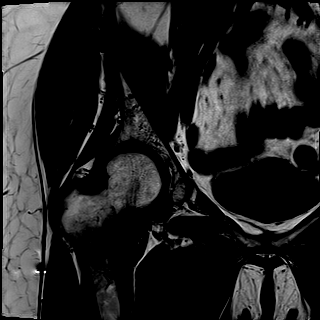
[im 14/24]
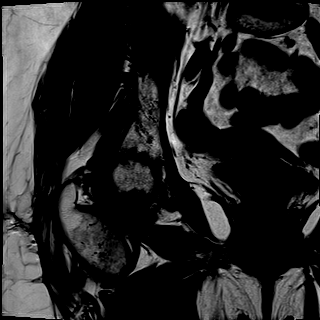
[im 19/24]
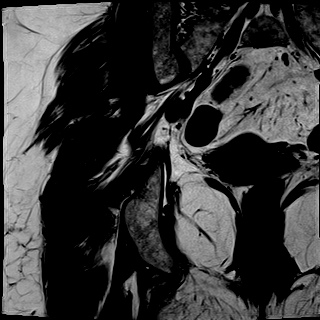
[im 24/24]
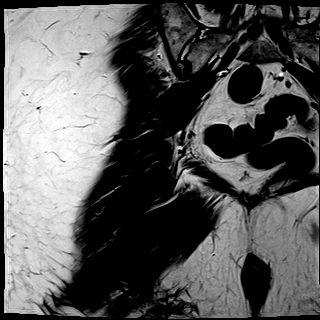

[Series 4: t2_cor_fs · coronal · right · 4.0mm · 0.73mm/px · 6 of 24 slices shown]
[im 1/24]
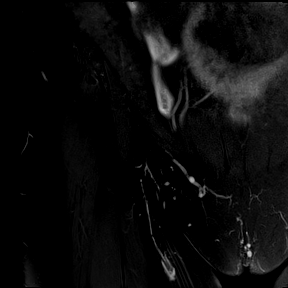
[im 5/24]
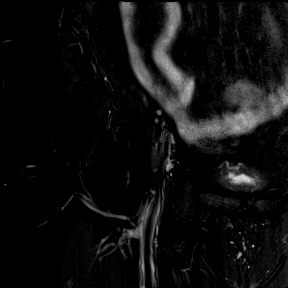
[im 10/24]
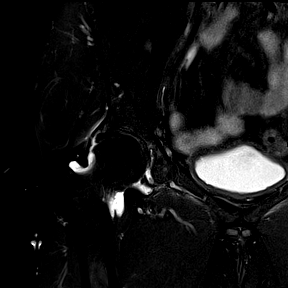
[im 14/24]
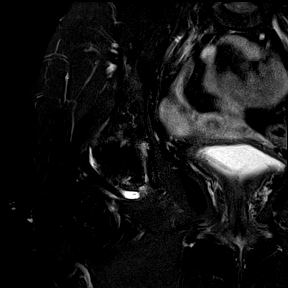
[im 19/24]
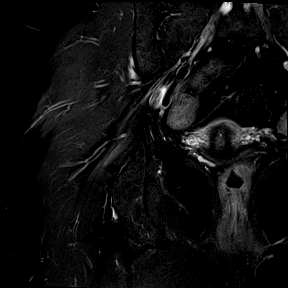
[im 24/24]
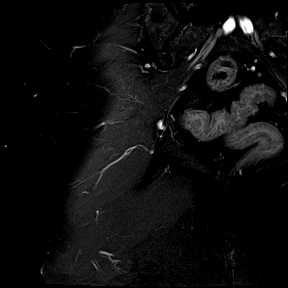

[Series 5: t1_axial · axial · right · 4.0mm · 0.35mm/px · z∈[-45,+130]mm · 10 of 40 slices shown]
[im 1/40]
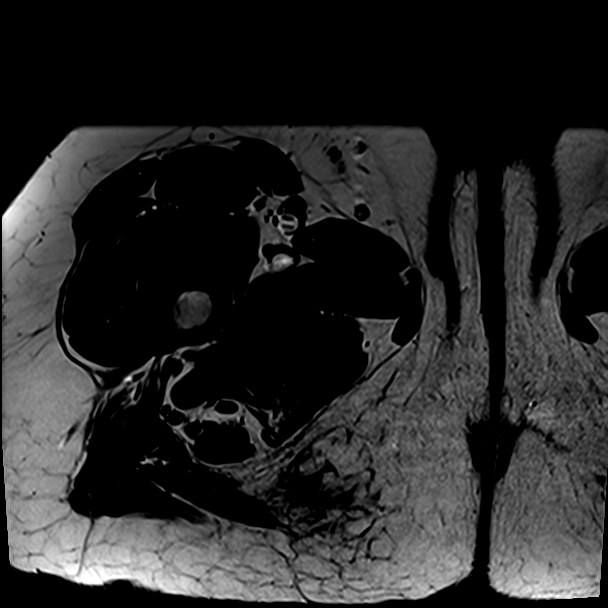
[im 4/40]
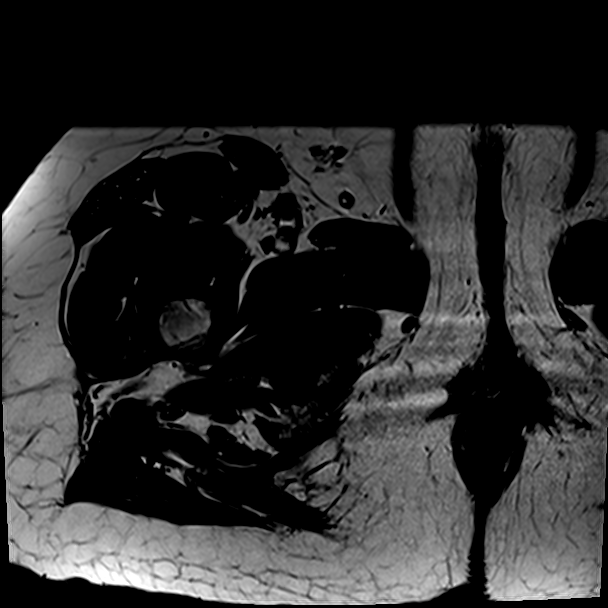
[im 8/40]
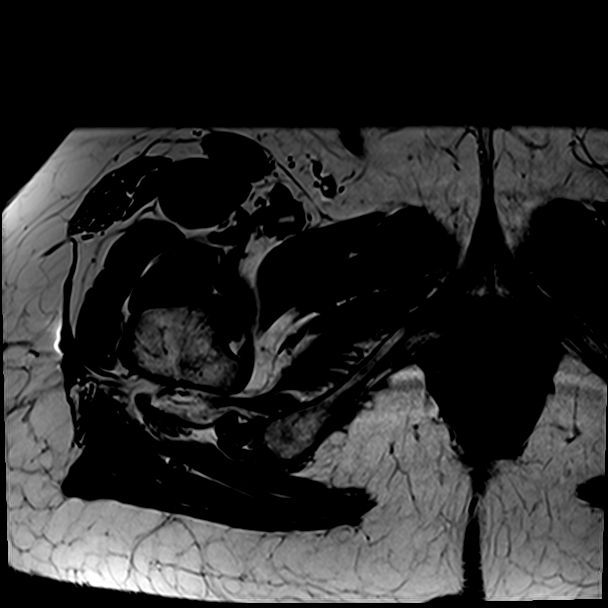
[im 12/40]
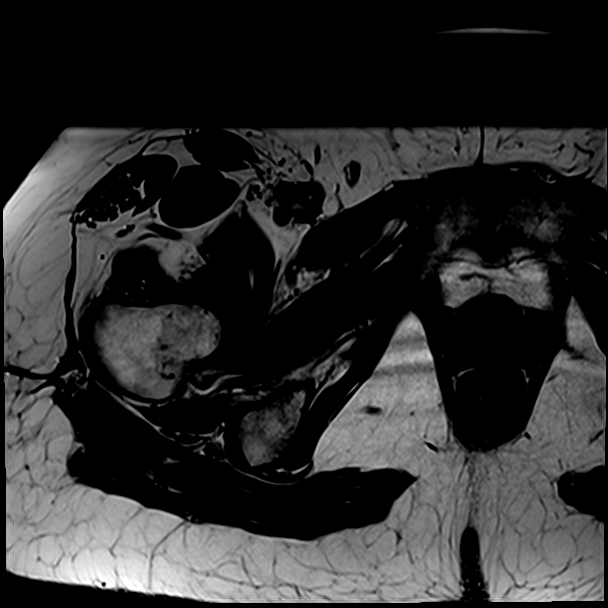
[im 16/40]
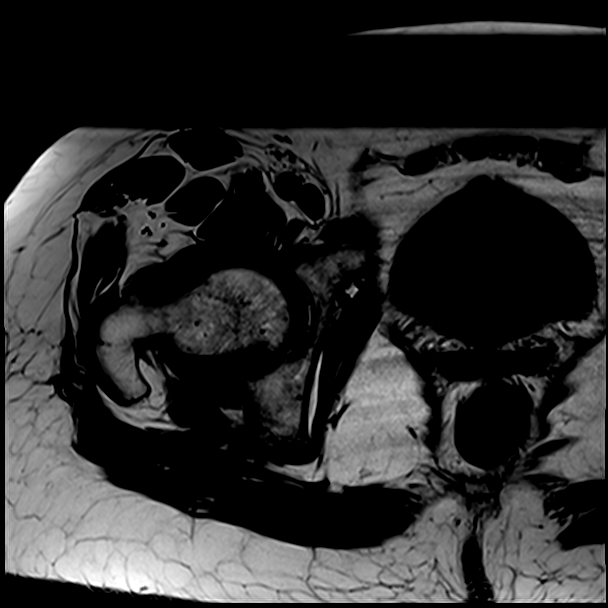
[im 20/40]
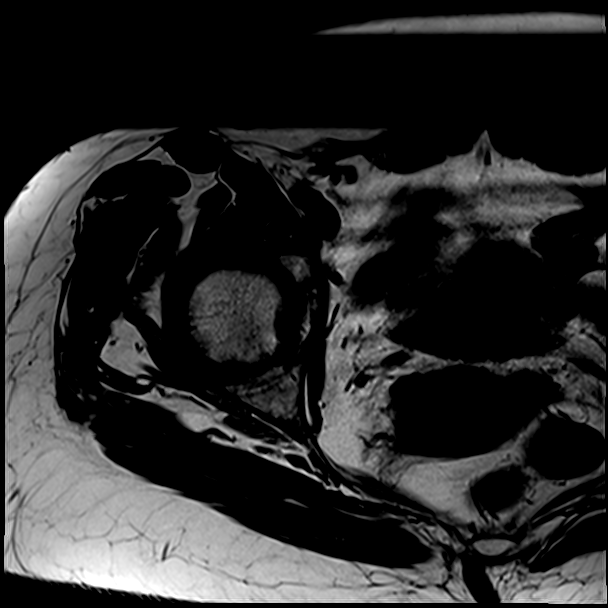
[im 24/40]
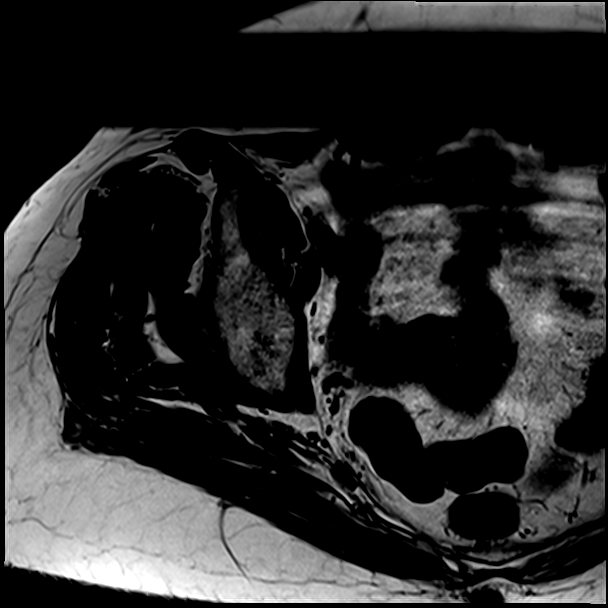
[im 28/40]
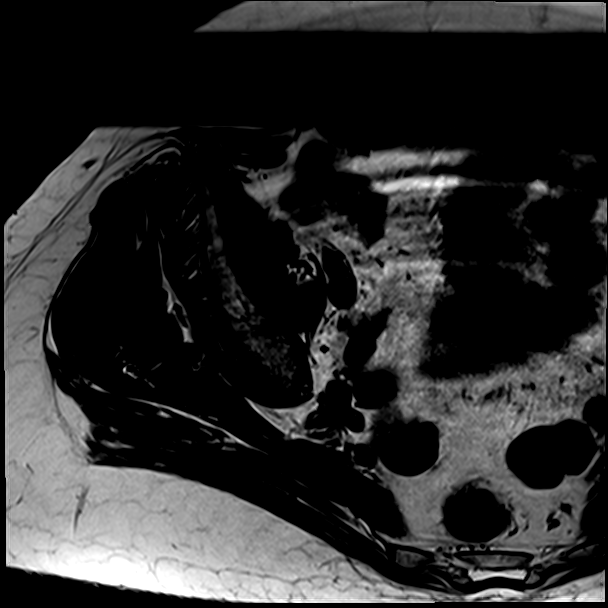
[im 32/40]
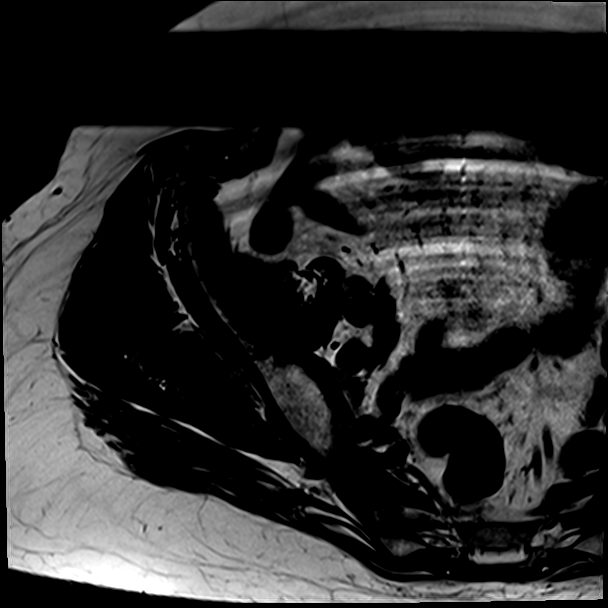
[im 36/40]
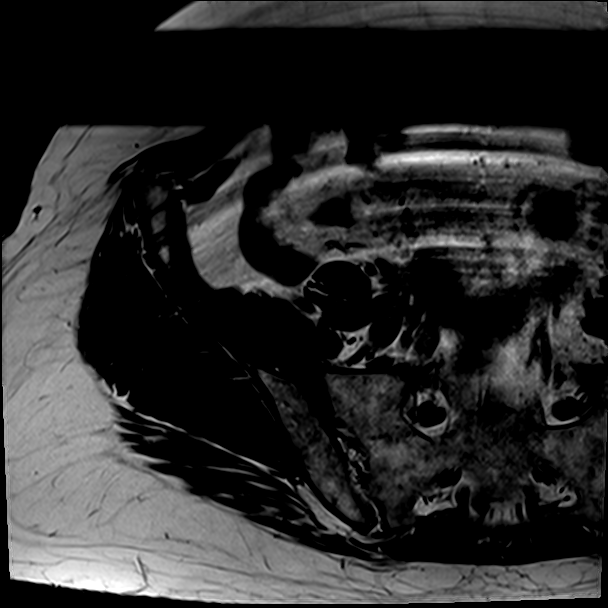

[Series 6: t2_axial_fs · axial · right · 4.0mm · 0.77mm/px · z∈[-30,+130]mm · 3 of 40 slices shown]
[im 4/40]
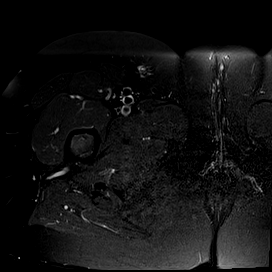
[im 20/40]
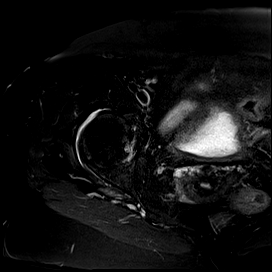
[im 36/40]
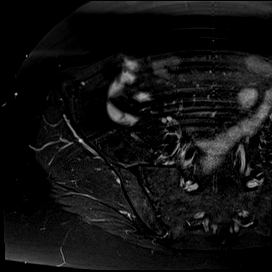

[25 of 40 positions shown; findings below may reference images not displayed]

FINDINGS: OSSEOUS: No acute fracture, avascular necrosis or aggressive osseous lesion.
HIP JOINT:
Labrum: Degeneration and tearing of the anterior superior and posterior superior acetabular labrum.
Articular cartilage: Interval progression of moderate hip chondrosis, with new regions of deep partial-thickness and full-thickness chondrosis along the posterior superior aspect of the femoral head
and acetabulum. Development of underlying reactive subchondral marrow edema and cystic change within the femoral head.
Effusion: Large hip joint effusion with synovitis and/or bodies.
GLUTEAL TENDONS AND GREATER TROCHANTERIC BURSA:
Gluteus medius tendon: Intact.
Gluteus minimus tendon: Intact.
Greater trochanteric bursa: No focal fluid. Redemonstration of postoperative changes within the soft tissues overlying the greater trochanter.
OTHER MUSCULOTENDINOUS STRUCTURES:
Rectus femoris tendon: Intact.
Iliopsoas tendon: Intact.
Proximal hamstring tendon origin: Intact.
Intramuscular edema and fatty atrophy: Narrowing of the ischiofemoral space, with fatty atrophy of the quadratus femoris muscle is again identified. No intramuscular edema. No additional areas of
focal fatty atrophy.
SCIATIC NERVE: The visualized course and caliber of the sciatic nerve is unremarkable.
OTHER: The visualized intrapelvic contents are unremarkable.
IMPRESSION: 1.  Interval progression of moderate right hip chondrosis, with degeneration and tearing of the acetabular labrum.
2.  Large right hip joint effusion with synovitis and/or bodies.

## 2021-11-17 IMAGING — CT CT CARDIAC CALCIUM SCORING
1 of 2 series · 11 of 20 positions shown, 14 images · non-contrast
Comparison: None

Images Obtained from [facility]
REASON FOR EXAM: Precordial pain, dyspnea
TECHNIQUE: Multiple helical CT images of the heart were acquired without the administration of IV contrast. Post processing software quantified calcium (Threshold 130 HU) in the coronary arteries
with Agatston scores. Coronary screening is most appropriately used in asymptomatic patients with clinical risk factors for coronary atherosclerosis.
Dose reduction technique used: Automated exposure control and adjustment of the mA and/or kV according to patient size. CT Studies and Cardiac Nuclear Medicine Studies in last 12 months = 1
Total radiation dose to patient is CTDIvol 9.39 mGy and DLP 167.00 mGy-cm.
NUMBER OF CALCIFIED CORONARY ARTERY PLAQUES
Left Main: 0
Left Anterior Descending: 0
Circumflex: 0
Right Coronary Artery and PDA: 0
Total: 0
CORONARY ARTERY CALCIUM SCORE
TOTAL AGATSTON SCORE: 0
OTHER FINDINGS: None

[Series 2: calcium score · axial · 0.32mm/px · z∈[-1099,-979]mm · 11 of 96 slices shown, 14 images]
[im 8/96  vessel]
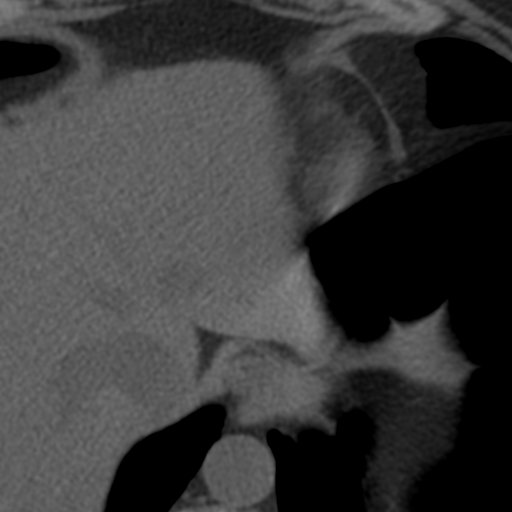
[im 8/96  lung]
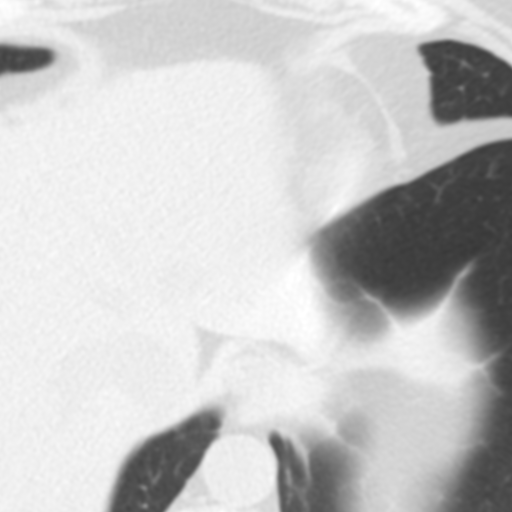
[im 16/96  vessel]
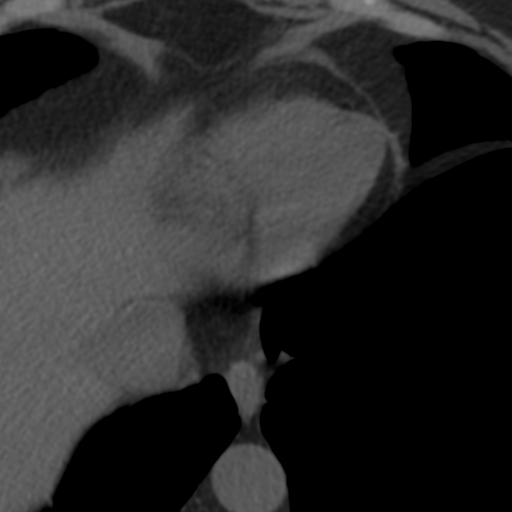
[im 24/96  vessel]
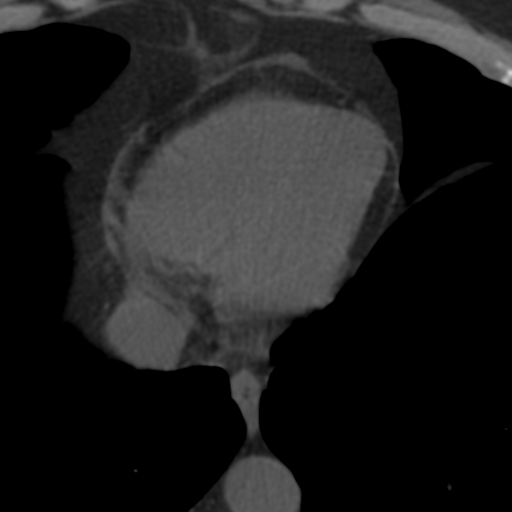
[im 32/96  vessel]
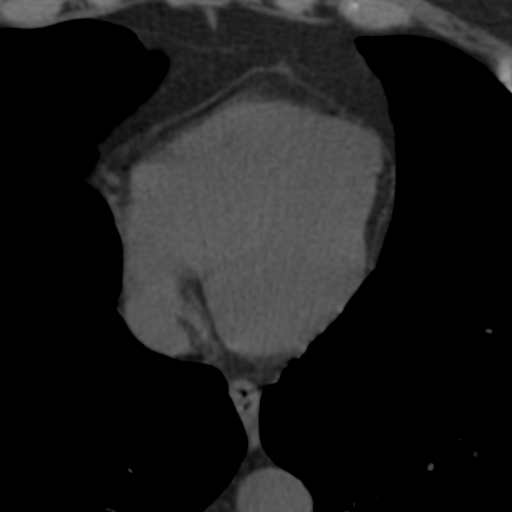
[im 40/96  vessel]
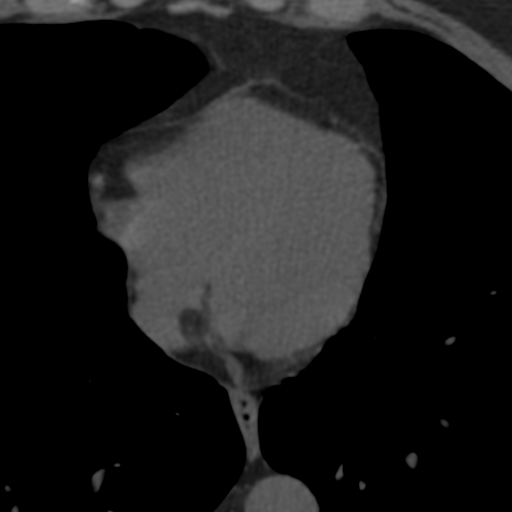
[im 40/96  lung]
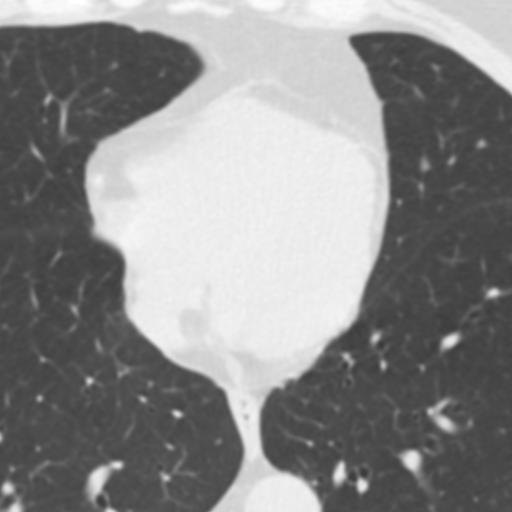
[im 48/96  vessel]
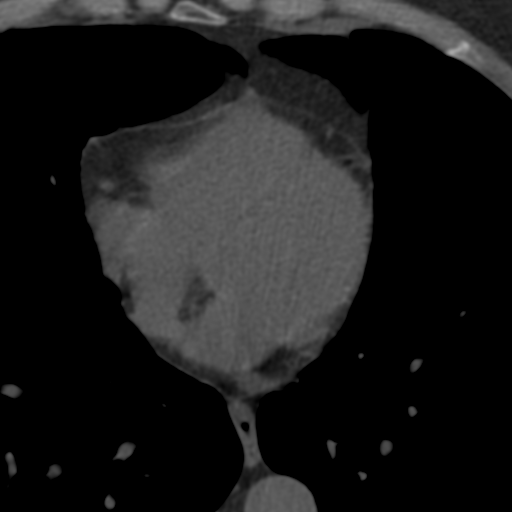
[im 56/96  vessel]
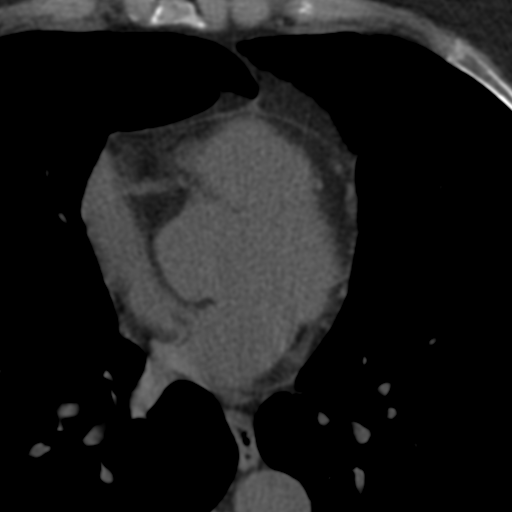
[im 64/96  vessel]
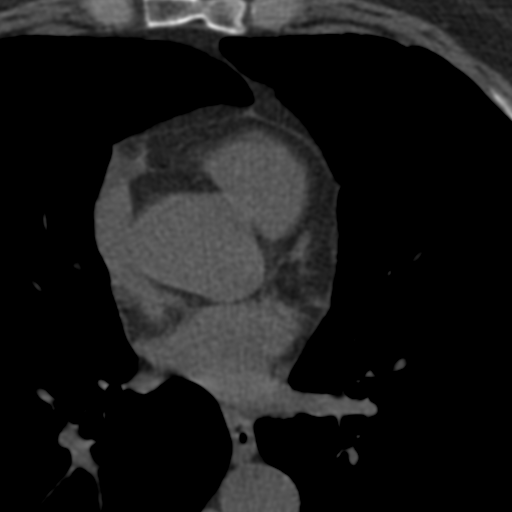
[im 72/96  vessel]
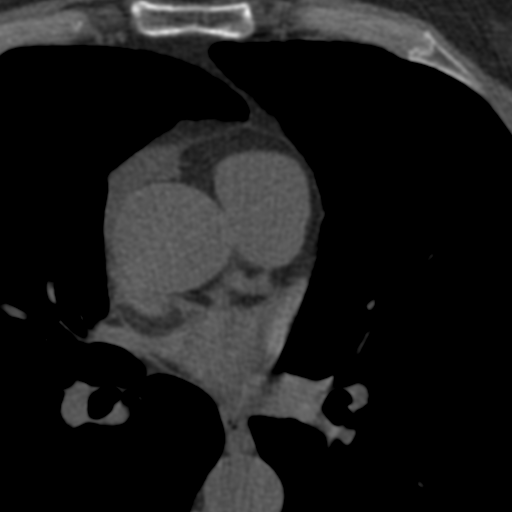
[im 72/96  lung]
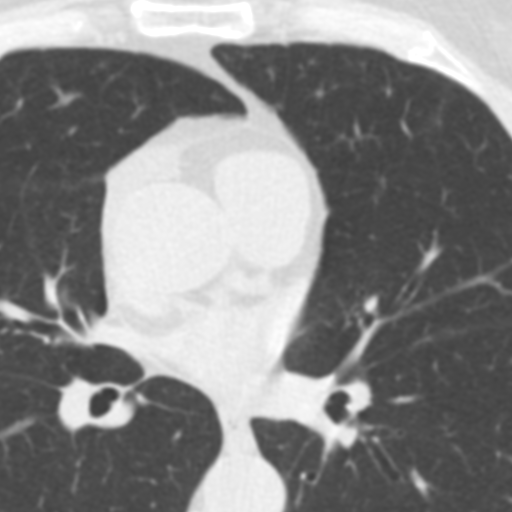
[im 80/96  vessel]
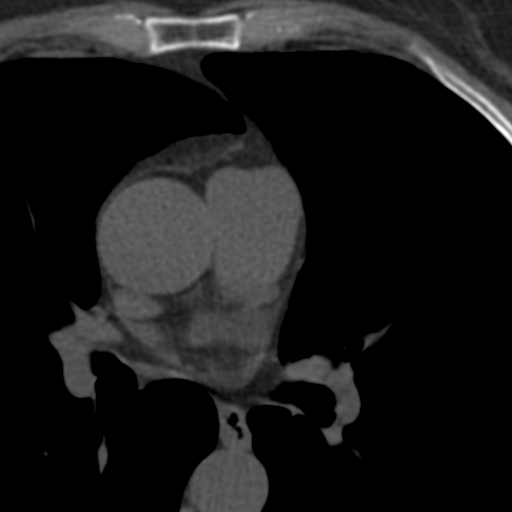
[im 88/96  vessel]
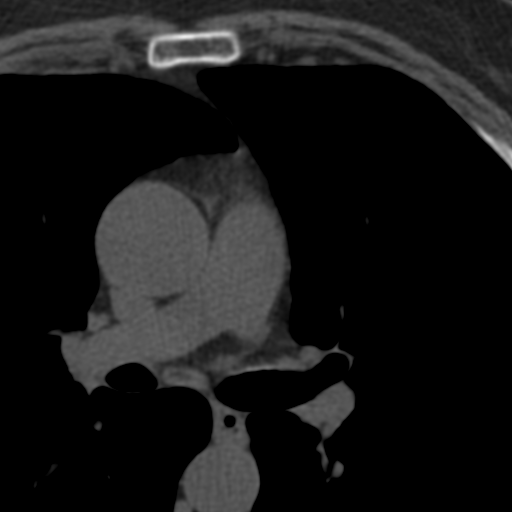

[11 of 20 positions shown; findings below may reference images not displayed]

IMPRESSION: 1. Examination is technically satisfactory.
2. Agatston score 0; no evidence of coronary artery disease (very low risk of future coronary event).
3. There is ectasia of the ascending thoracic aorta measuring approximately 37.9 x 37.9.
0: No evidence of CAD (very low risk of future coronary event)
1-10: Minimal CAD (low risk of future coronary event)
11-100: Mild CAD (increased risk of future coronary event)
101-400: Moderate CAD (relative lifetime risk of coronary event is 4.3 times a patient with a score of 0)*
566-8666: Severe CAD (relative lifetime risk of coronary event is 7.2 times a patient with a score of 0)*
>0666 (relative lifetime risk of coronary event is 10.8 times a patient with a score of 0)*
*Daniel K, et al, Clinical expert consensus document on coronary calcium screening, J Am Coll Cardiology. 6117; [DATE]

## 2021-11-18 IMAGING — MR MRI HIP LT WO CONTRAST
5 series · 40 of 40 positions shown · non-contrast
Comparison: MRI of the left hip, 04/30/2021.

Images Obtained from [facility]
INDICATION: Pain in left hip. History of bursectomy 02/17/21 and injections for pain.
TECHNIQUE: Multiplanar, multisequence imaging of the left hip was performed without contrast.

[Series 2: t1_cor · coronal · 4.0mm · 0.78mm/px · 8 of 28 slices shown]
[im 1/28]
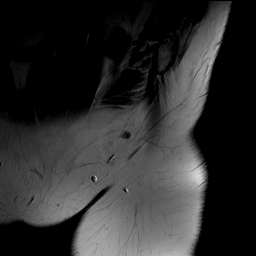
[im 4/28]
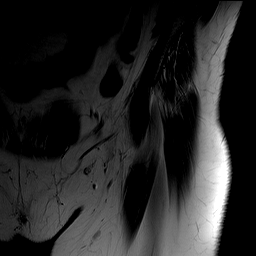
[im 8/28]
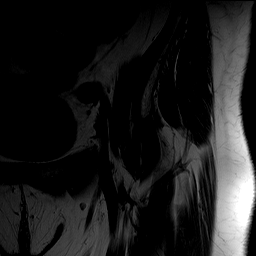
[im 12/28]
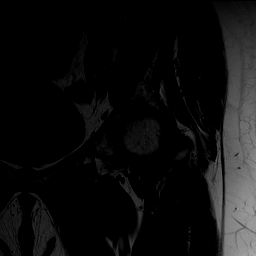
[im 16/28]
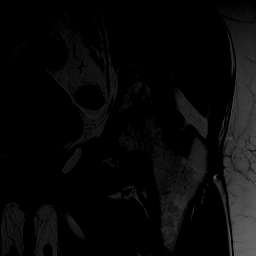
[im 20/28]
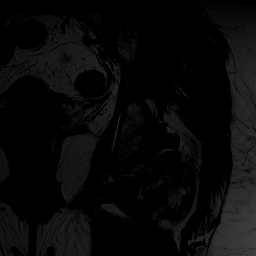
[im 24/28]
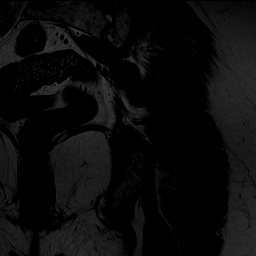
[im 28/28]
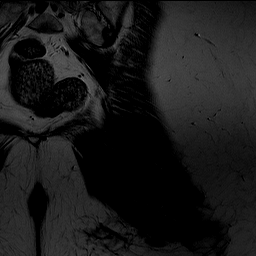

[Series 3: t2_cor_fs · coronal · 4.0mm · 0.78mm/px · 8 of 28 slices shown]
[im 1/28]
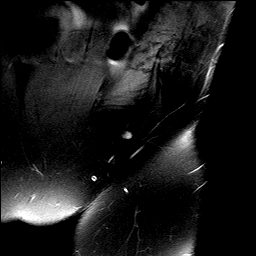
[im 4/28]
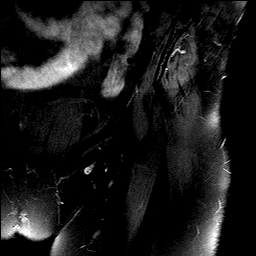
[im 8/28]
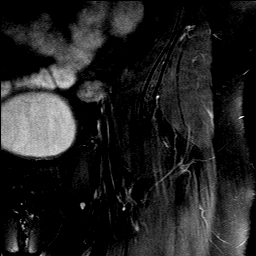
[im 12/28]
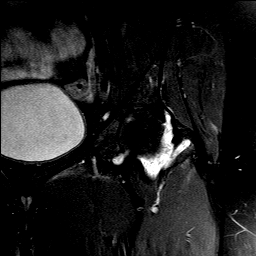
[im 16/28]
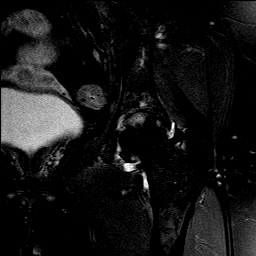
[im 20/28]
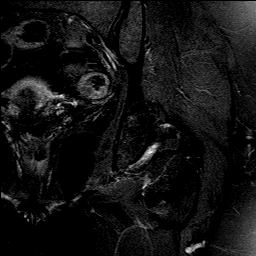
[im 24/28]
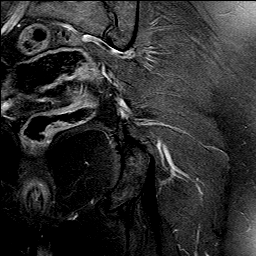
[im 28/28]
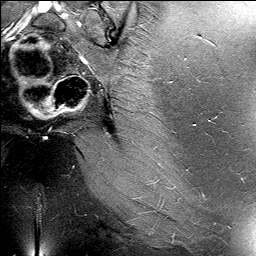

[Series 4: t2_axial_fs · axial · 4.0mm · 0.78mm/px · z∈[-55,+100]mm · 9 of 32 slices shown]
[im 1/32]
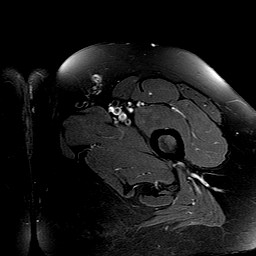
[im 4/32]
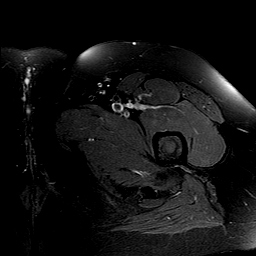
[im 8/32]
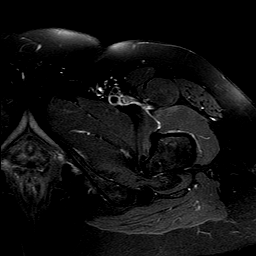
[im 12/32]
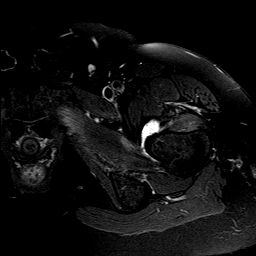
[im 16/32]
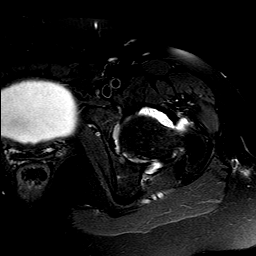
[im 20/32]
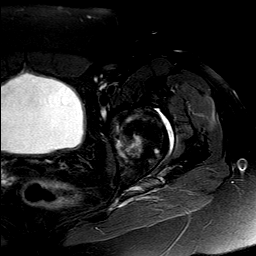
[im 24/32]
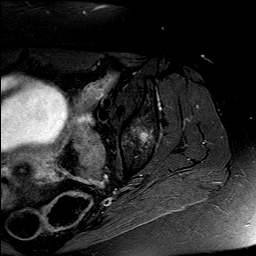
[im 28/32]
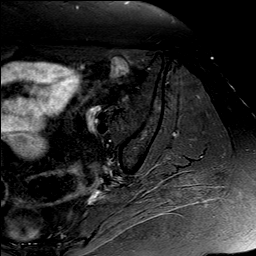
[im 32/32]
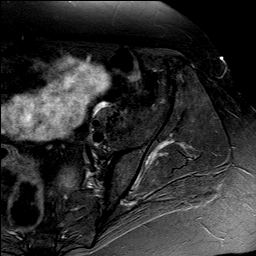

[Series 5: t1_axial · axial · 4.0mm · 0.43mm/px · z∈[-55,+100]mm · 9 of 32 slices shown]
[im 1/32]
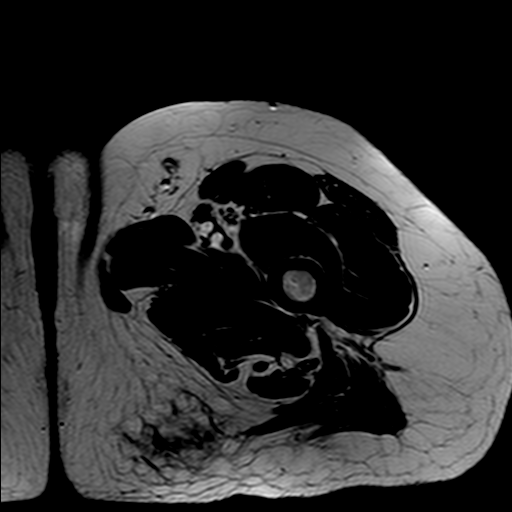
[im 4/32]
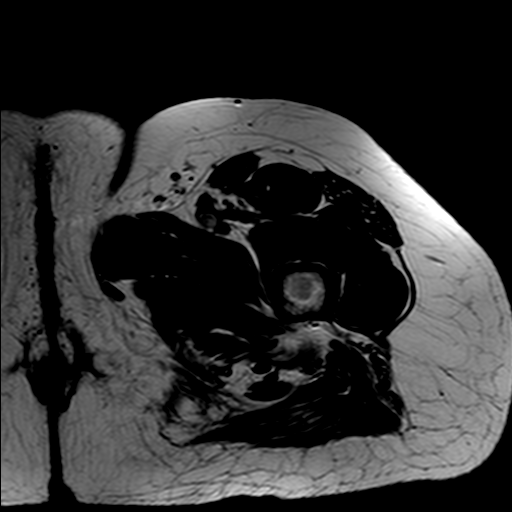
[im 8/32]
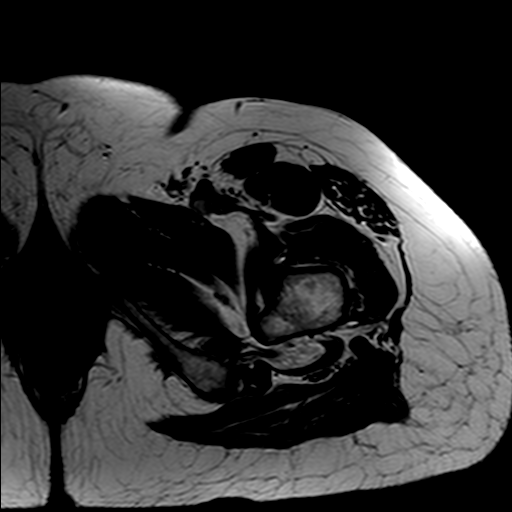
[im 12/32]
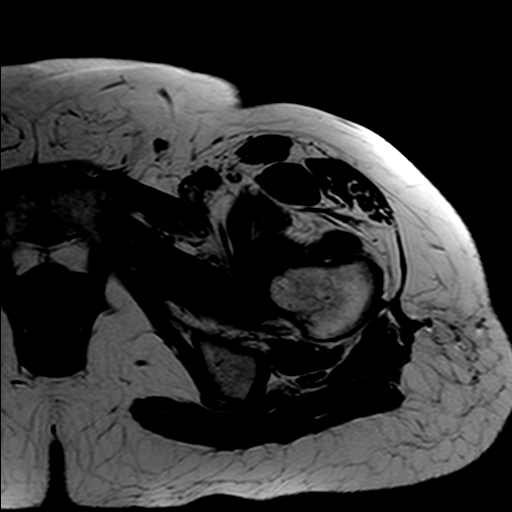
[im 16/32]
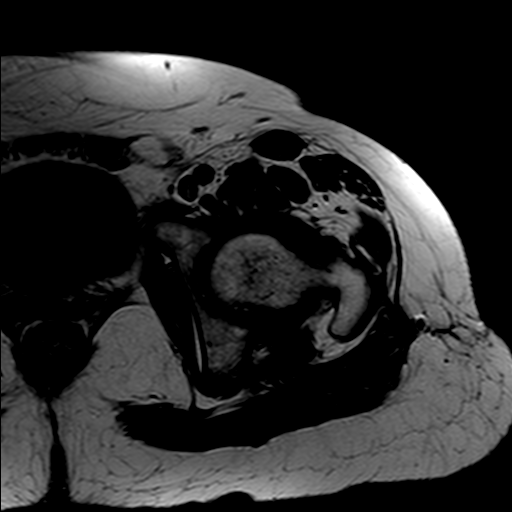
[im 20/32]
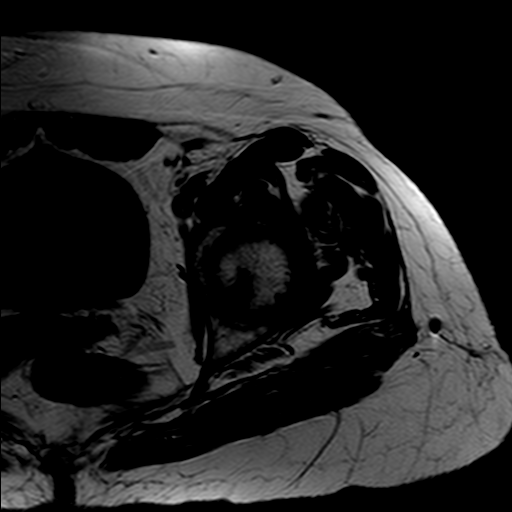
[im 24/32]
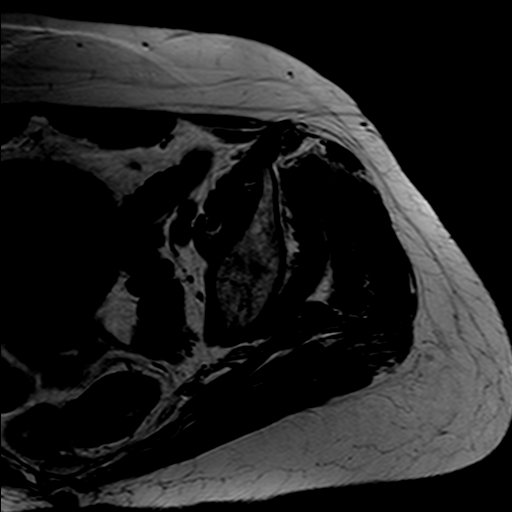
[im 28/32]
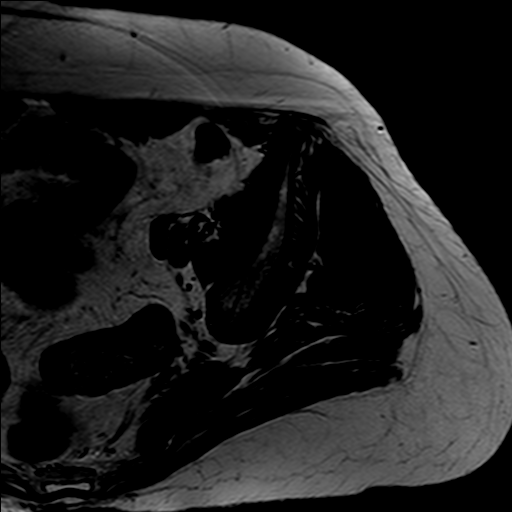
[im 32/32]
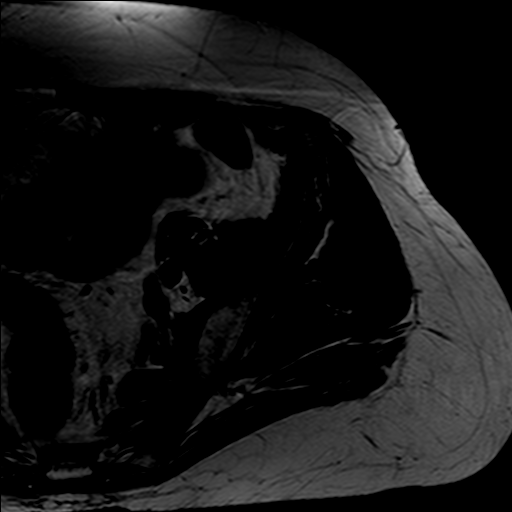

[Series 6: t2_sag_fs · sagittal · 4.0mm · 0.74mm/px · 6 of 23 slices shown]
[im 1/23]
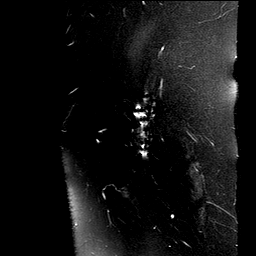
[im 5/23]
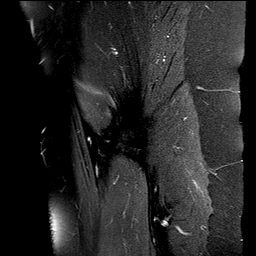
[im 9/23]
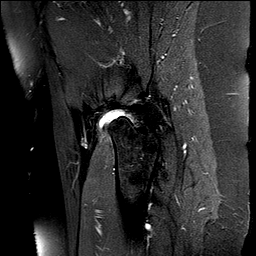
[im 14/23]
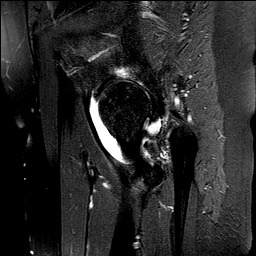
[im 18/23]
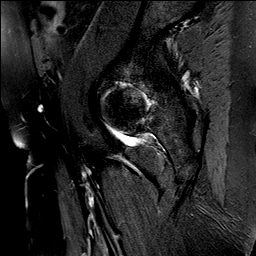
[im 23/23]
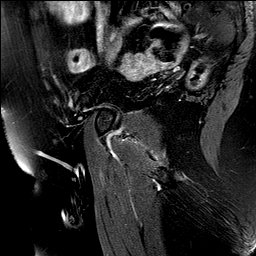

[40 of 40 positions shown; findings below may reference images not displayed]

FINDINGS: OSSEOUS: No acute fracture, avascular necrosis or aggressive osseous lesion.
HIP JOINT:
Labrum: Redemonstration of degeneration and tearing of the anterior superior acetabular labrum.
Articular cartilage: Significant interval progression of diffuse deep partial-thickness and full-thickness chondrosis of the femoroacetabular articular cartilage, with development of underlying
subchondral cystic change and marrow edema in the femoral head and acetabulum.
Effusion: Interval increase in size of a moderate to large hip joint effusion with internal synovitis and/or debris.
GLUTEAL TENDONS AND GREATER TROCHANTERIC BURSA:
Gluteus medius tendon: Intact.
Gluteus minimus tendon: Intact.
Greater trochanteric bursa: No focal fluid. Postsurgical changes overlying the left greater trochanter again identified, with resolution of fluid signal intensity within the operative bed.
OTHER MUSCULOTENDINOUS STRUCTURES:
Rectus femoris tendon: Intact.
Iliopsoas tendon: Intact.
Proximal hamstring tendon origin: Intact.
Intramuscular edema and fatty atrophy: None.
SCIATIC NERVE: The visualized course and caliber of the sciatic nerve is unremarkable.
OTHER: The visualized intrapelvic contents are unremarkable.
IMPRESSION: 1.  Interval progression of moderate to severe left hip chondrosis, with development of subchondral marrow edema and cystic change in the femoral head and acetabulum.
2.  Interval increase in size of a moderate to large left hip joint effusion with internal synovitis and/or debris, which could be seen with underlying inflammatory arthropathy. Correlate with
clinical history.

## 2022-01-31 IMAGING — US US THYROID
1 series · 13 of 25 positions shown · non-contrast
Comparison: None.

Images Obtained from [facility]
HISTORY: Disorder of thyroid, hyperthyroidism described by patient.
TECHNIQUE: Thyroid ultrasound.

[Series 1: us thyroid · 13 of 33 slices shown]
[im 1/33]
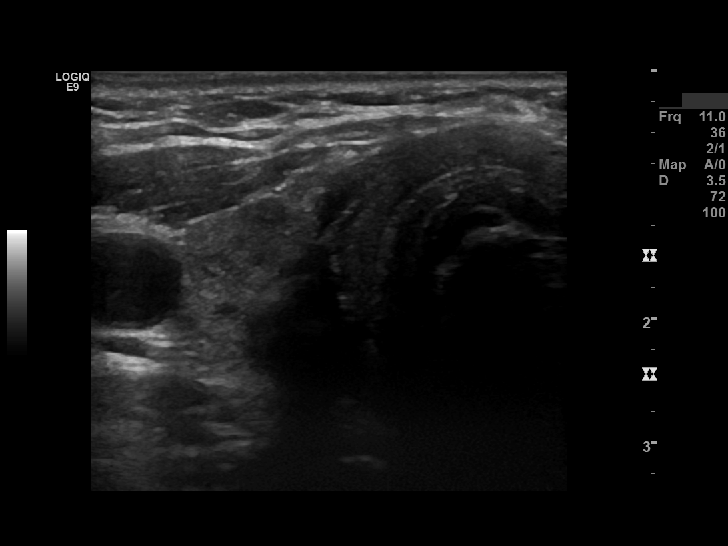
[im 3/33]
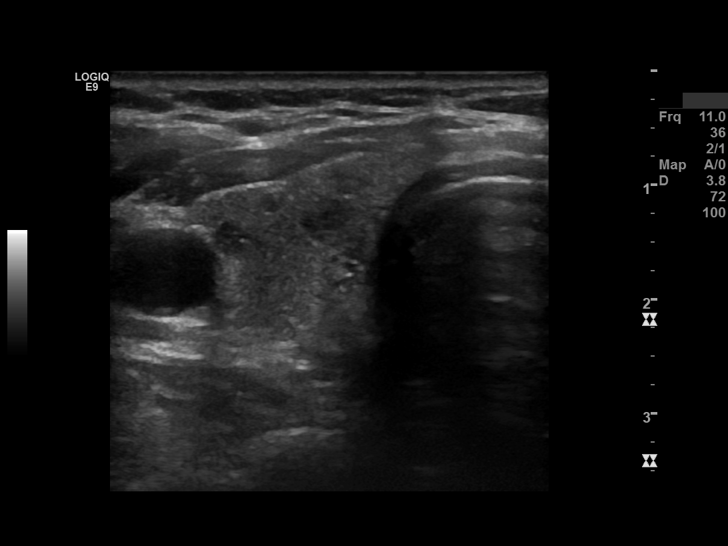
[im 6/33]
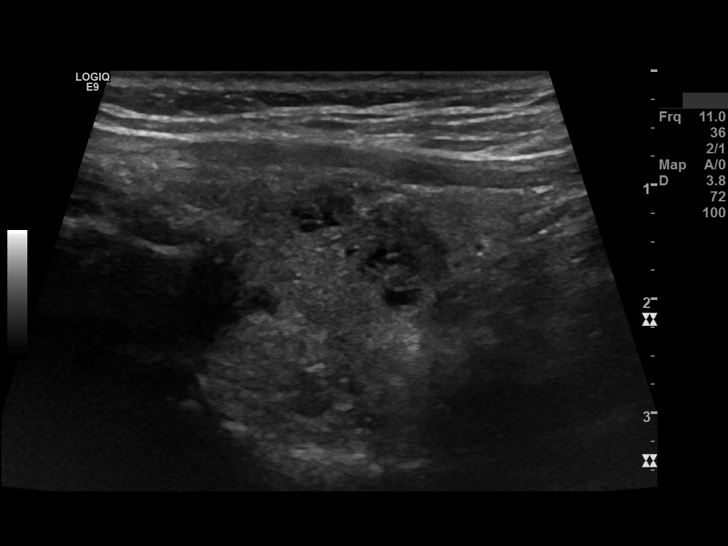
[im 9/33]
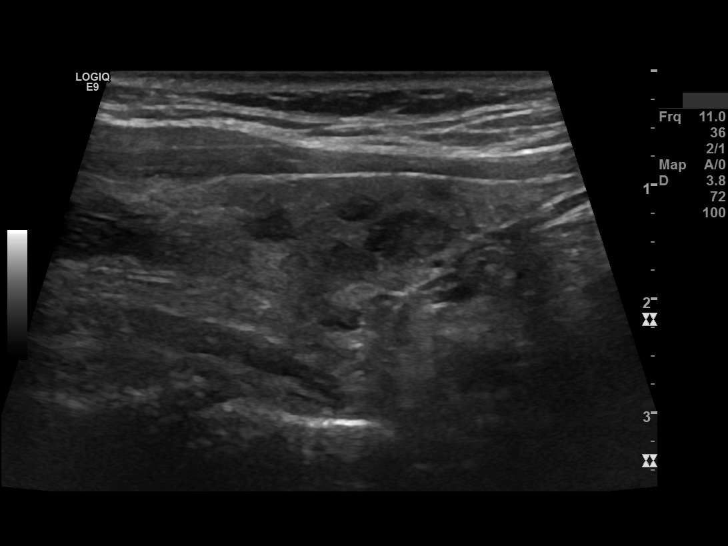
[im 11/33]
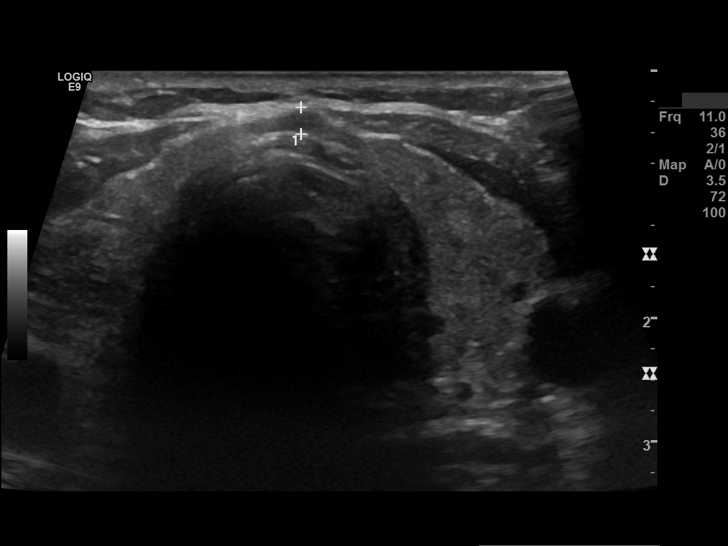
[im 14/33]
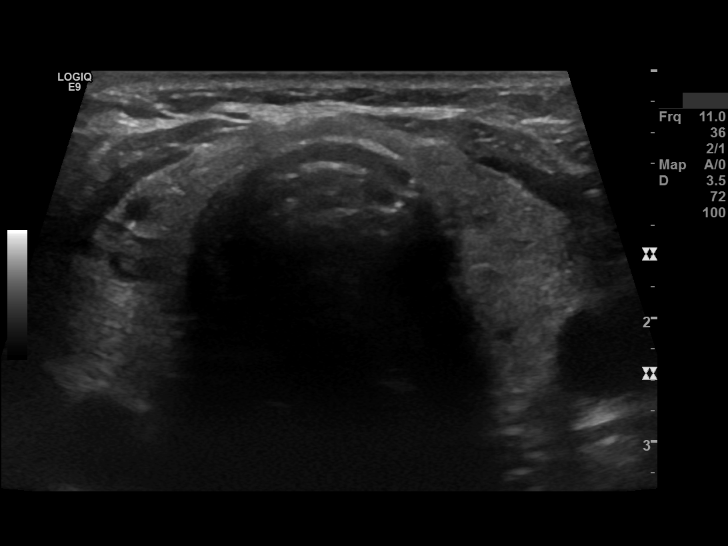
[im 17/33]
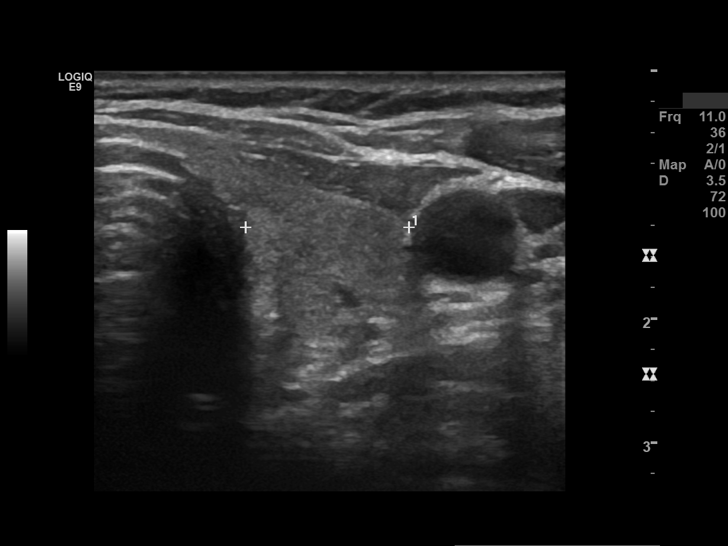
[im 19/33]
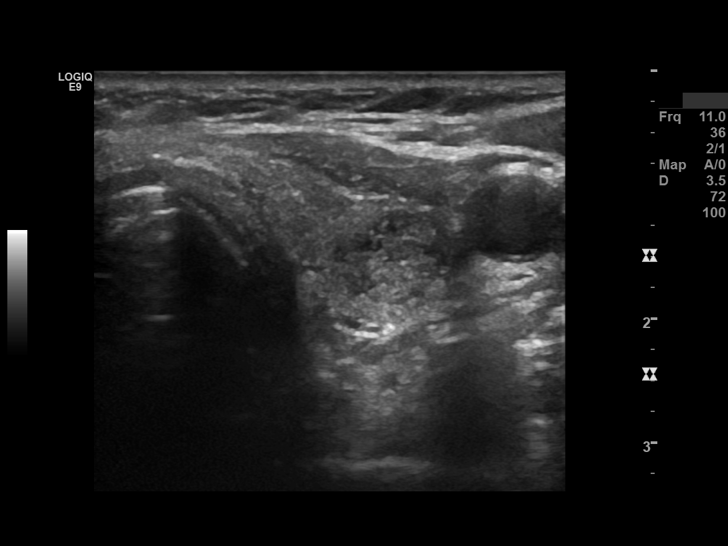
[im 22/33]
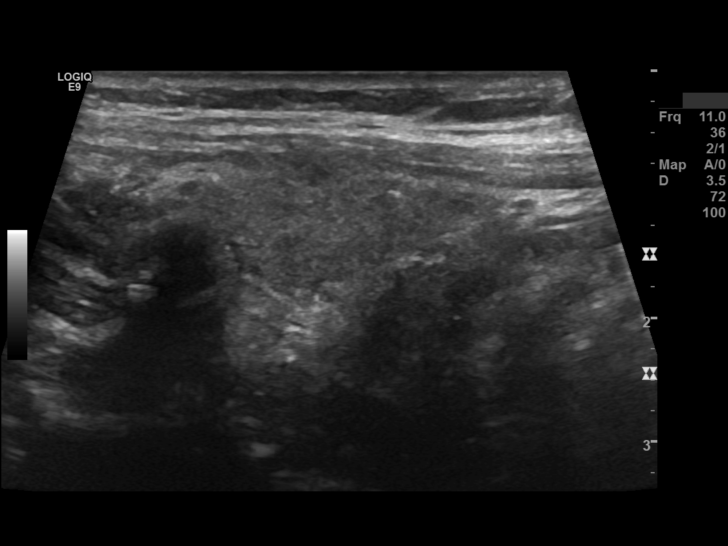
[im 25/33]
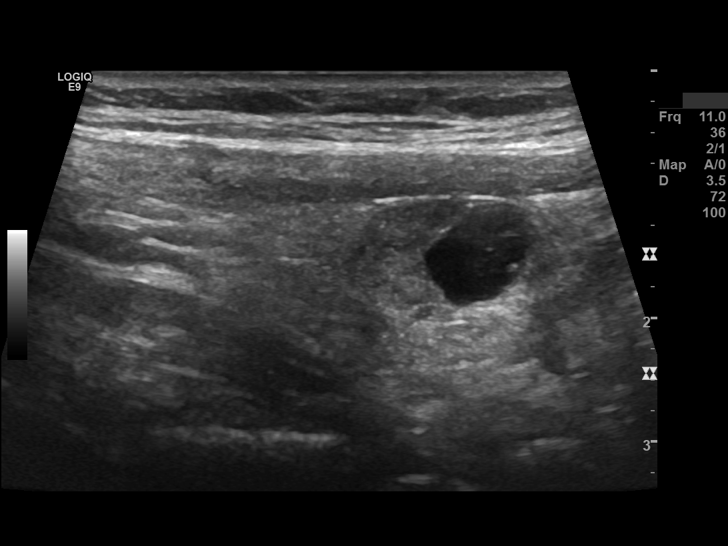
[im 27/33]
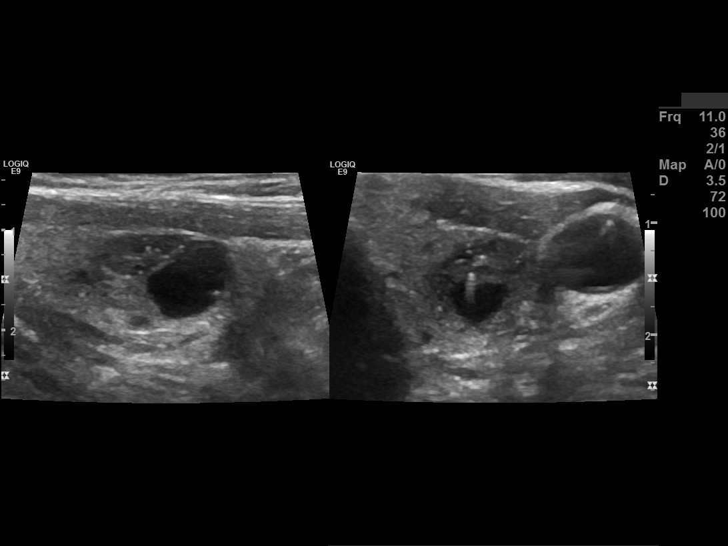
[im 30/33]
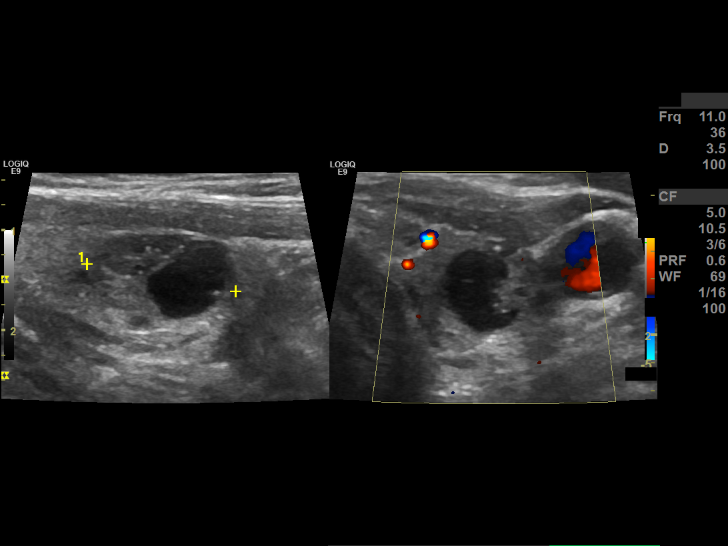
[im 33/33]
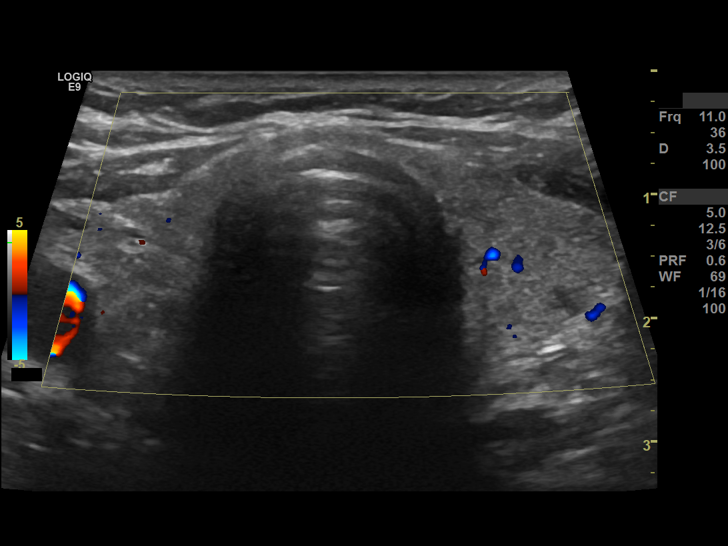

[13 of 25 positions shown; findings below may reference images not displayed]

FINDINGS: Mild inhomogeneous echotexture of both thyroid lobes.
RIGHT LOBE:  44 x 17 x 16 mm
LEFT LOBE:  40 x 12 x 13 mm
ISTHMUS:  2 mm
THYROID NODULES:
RIGHT LOBE NODULES:
No discrete nodules greater than 10 mm is measured.
LEFT LOBE NODULES:
Mixed solid and cystic echogenicity nodule with scattered hyperechoic foci, some showing ringdown artifact, are demonstrated in the nodule at the lower pole. Nodule measures 15 x 9 x 11 mm and is
consistent with TR 4 nodule.
IMPRESSION: 1. 15 mm TR 4 nodule in the left lower pole. Consider biopsy of the solid portion superiorly with the microcystic hyperechoic foci are located to exclude papillary thyroid carcinoma. Findings are
minimally suspicious in appearance on thyroid ultrasound but using criteria below, FNA would be prudent.
ACR TI-RADS recommendations:
TR5 (>= 7 points) - FNA if > 1 cm, follow up if 0.5-0.9 cm every year for 5 years
TR4 (4-6 points) - FNA if >=1.5 cm, follow up if 1-1.4 cm in 1, 2, 3, and 5 years
TR3 (3 points) - FNA if 2.5 cm, follow up if 1.5-2.4 cm in 1, 3, and 5 years
TR2  (2 points) \T\ TR1 (0 points) - No FNA or follow up
Reference: Mccormick, Ever, et al.  ACR Thyroid Imaging, Reporting and Data System (TI-RADS):  White Paper of the ACR TI-RADS Committee. J Am Coll Radiology 2220, 14(5) 587-595.
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
## 2022-03-23 IMAGING — US BX THYROID FNA LT
1 series · 14 of 18 positions shown · non-contrast
Comparison: Correlation is made to previous thyroid ultrasound January 31, 2022
PROCEDURE:  Prior to the procedure, the risks, complications, and alternatives were discussed.

Images Obtained from [facility]
HISTORY: Left thyroid nodule
TECHNIQUE: Ultrasound-guided left thyroid lobe nodule fine-needle aspiration was performed.

[Series 1: bx thyroid fna left · 14 of 18 slices shown]
[im 1/18]
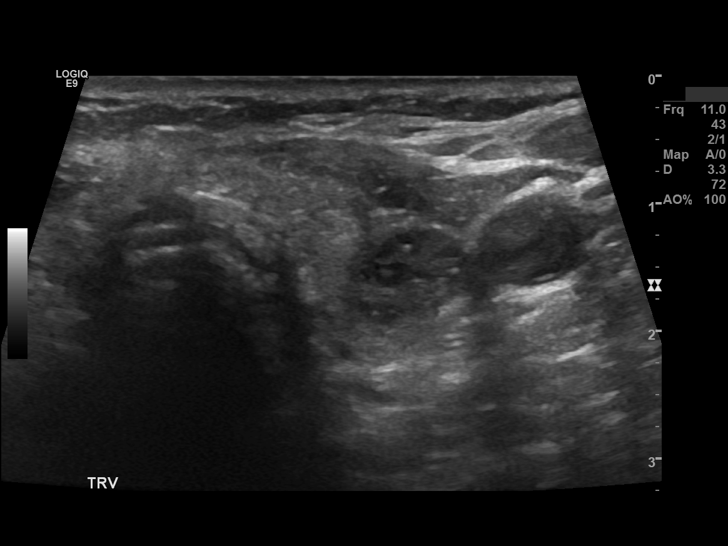
[im 2/18]
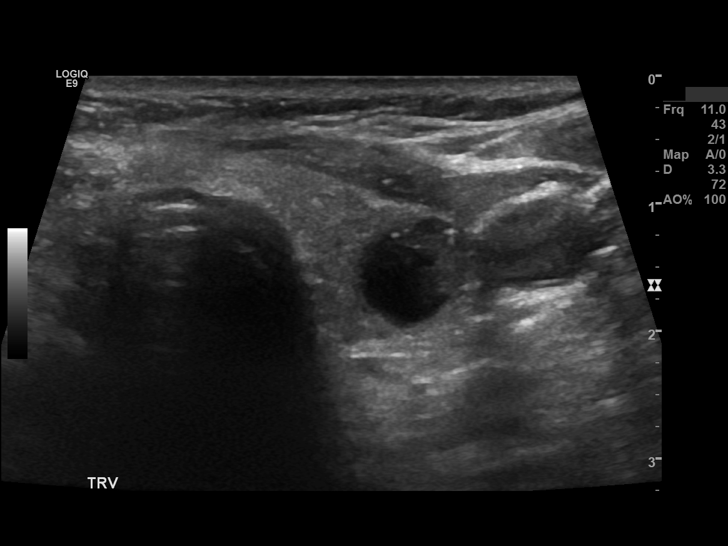
[im 4/18]
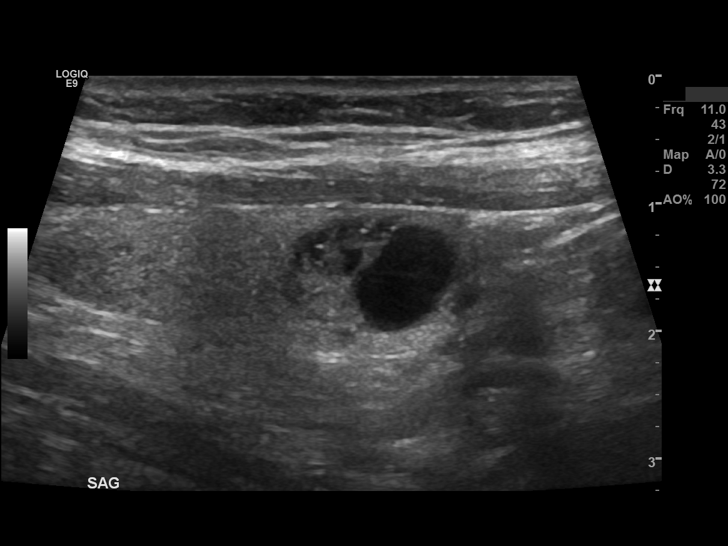
[im 5/18]
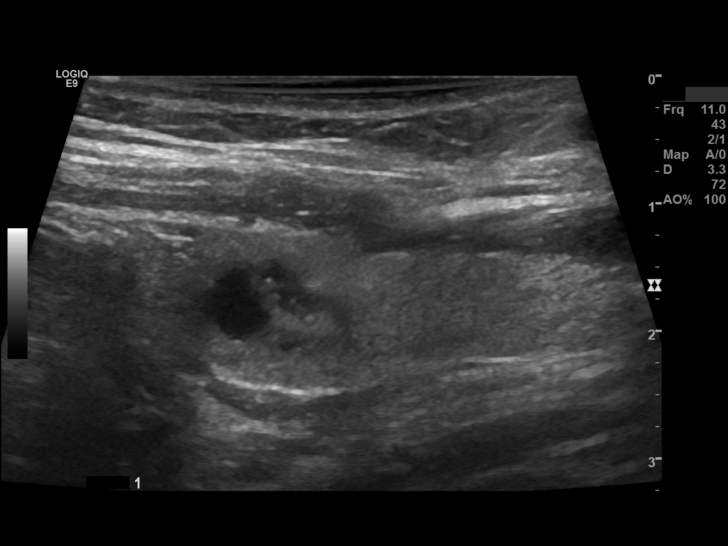
[im 6/18]
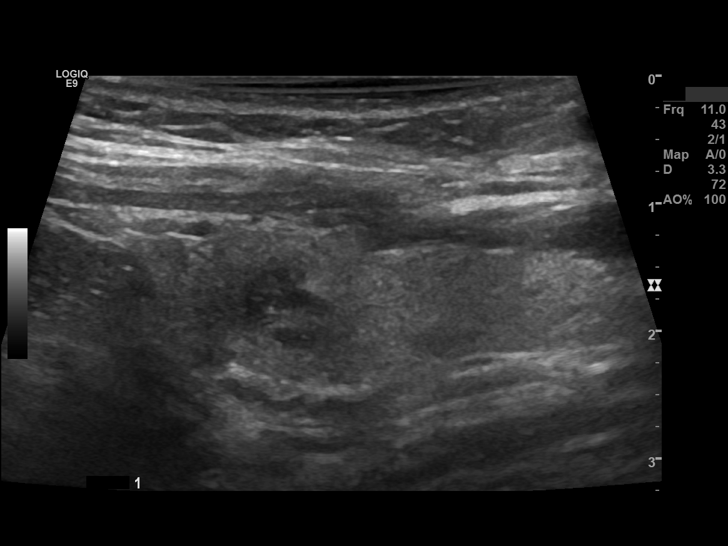
[im 8/18]
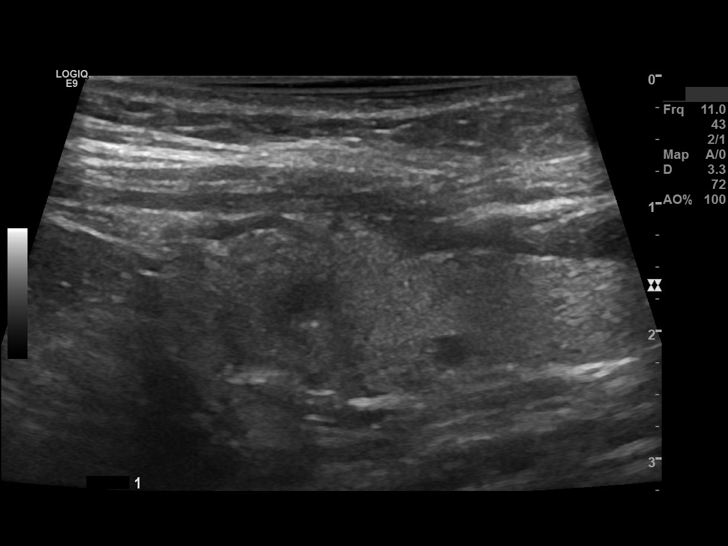
[im 9/18]
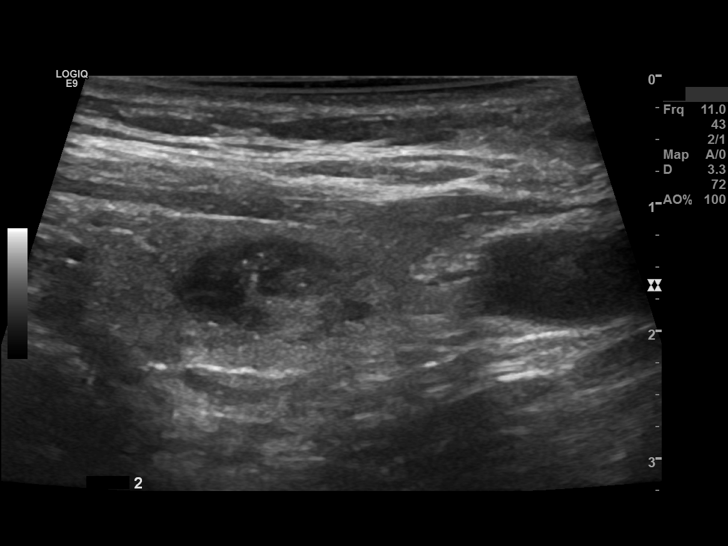
[im 10/18]
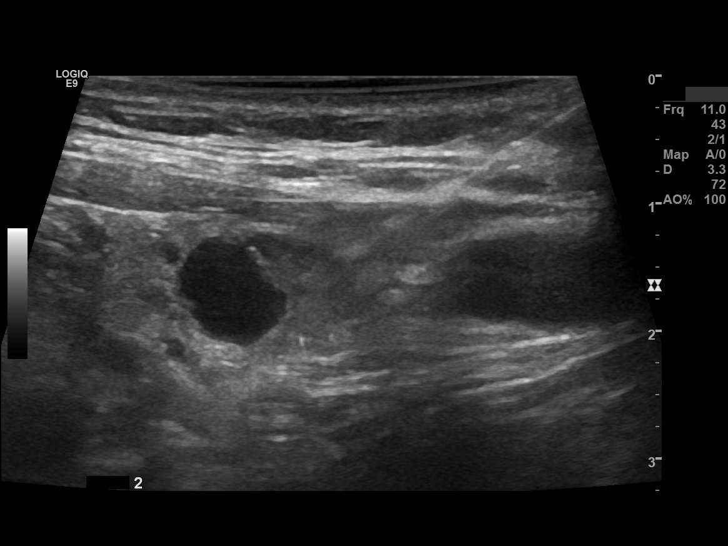
[im 11/18]
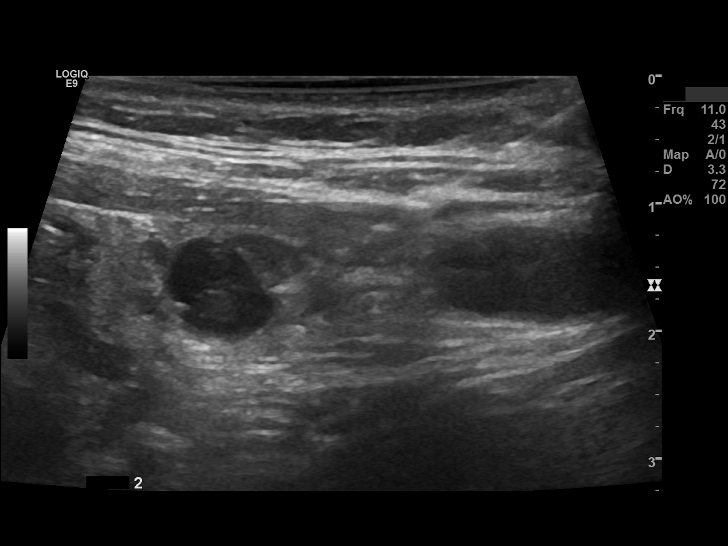
[im 13/18]
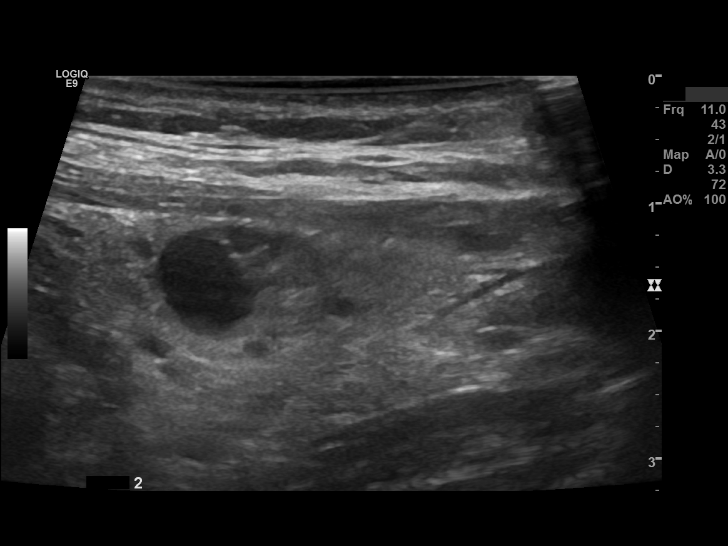
[im 14/18]
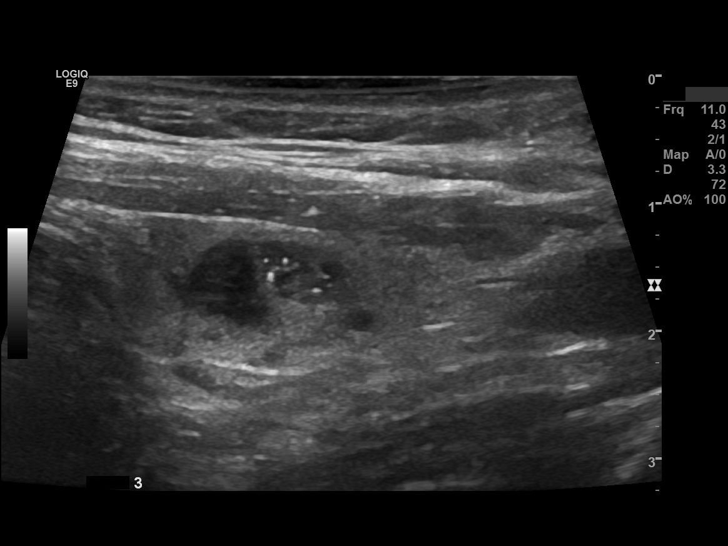
[im 15/18]
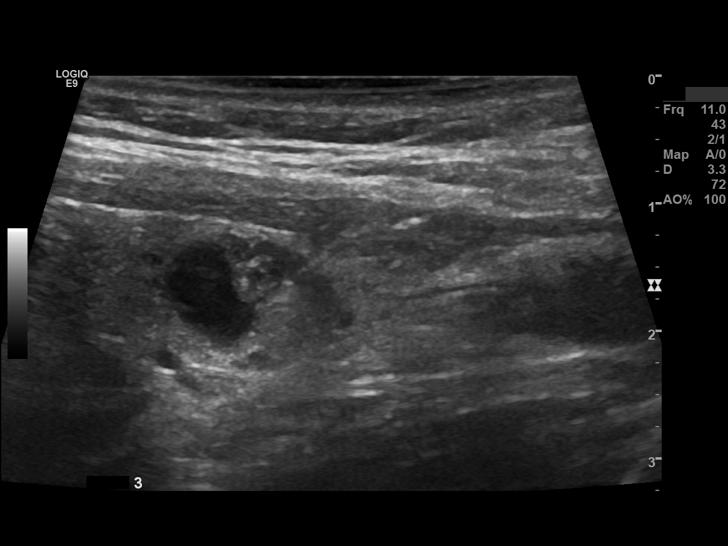
[im 17/18]
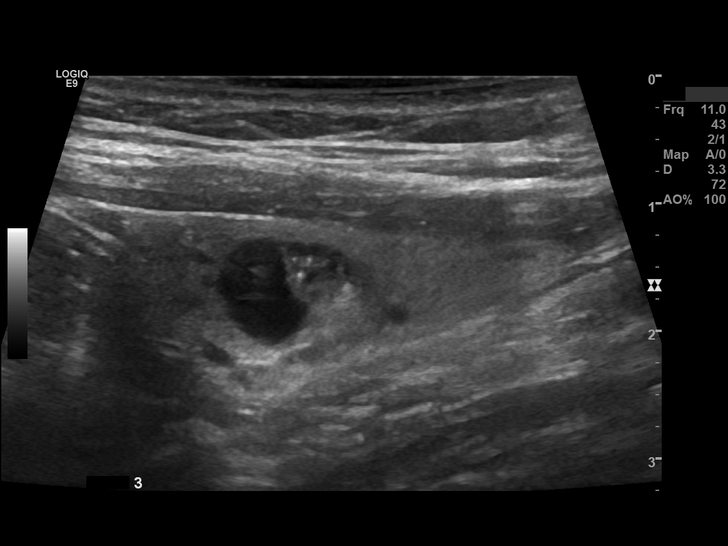
[im 18/18]
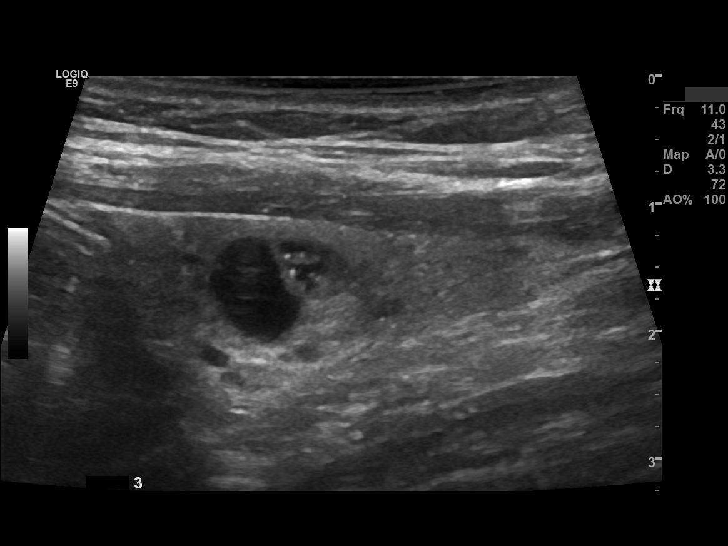

[14 of 18 positions shown; findings below may reference images not displayed]

Informed consent was obtained and signed. The neck was prepped with a Betadine solution. Buffered 1%
lidocaine was used as a local anesthetic.  Under direct ultrasound guidance and visualization, a 25-gauge needle was directed into the left thyroid lobe nodule, and samples were obtained with rapid
up-and-down motions (capillary technique).   Wet cytology preparations were made. Three passes/biopsies were performed. The procedure was well tolerated by the patient with no significant bleeding or
discomfort.
IMPRESSION: Ultrasound-guided left thyroid nodule fine-needle aspiration. The pathology results are pending and the pathology results will be issued by the pathologist.

## 2022-05-22 IMAGING — MG MAMMO SCRN BIL W/CAD TOMO
8 of 12 series · 8 of 28 positions shown · non-contrast
Comparison: No prior imaging studies are available for comparison.

Images Obtained from [facility]
INDICATION: Screening.
TECHNIQUE: Bilateral 2-D digital screening mammogram was performed followed by 3-D tomosynthesis.  Current study was also evaluated with a computer aided detection (CAD) system.
MAMMOGRAM FINDINGS:
There are scattered areas of fibroglandular density.
There is a new focal asymmetry measuring 5 mm with associated calcifications in the left breast at 12 o'clock located 5 centimeters from the nipple.
In the right breast, no suspicious masses, calcifications or other abnormalities are seen.

[L CC (1 of 3)]
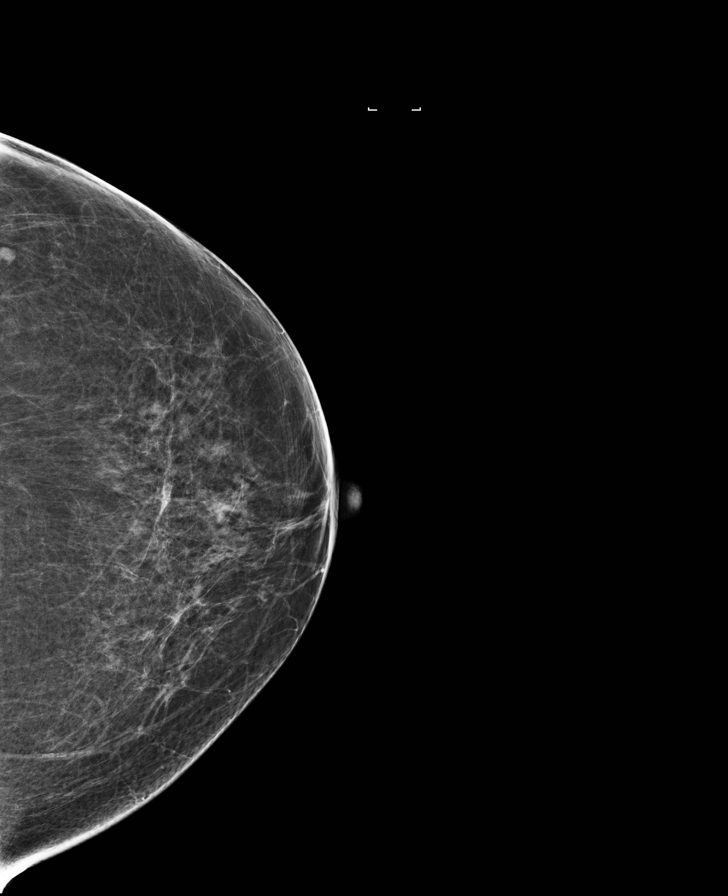

[L MLO (1 of 3)]
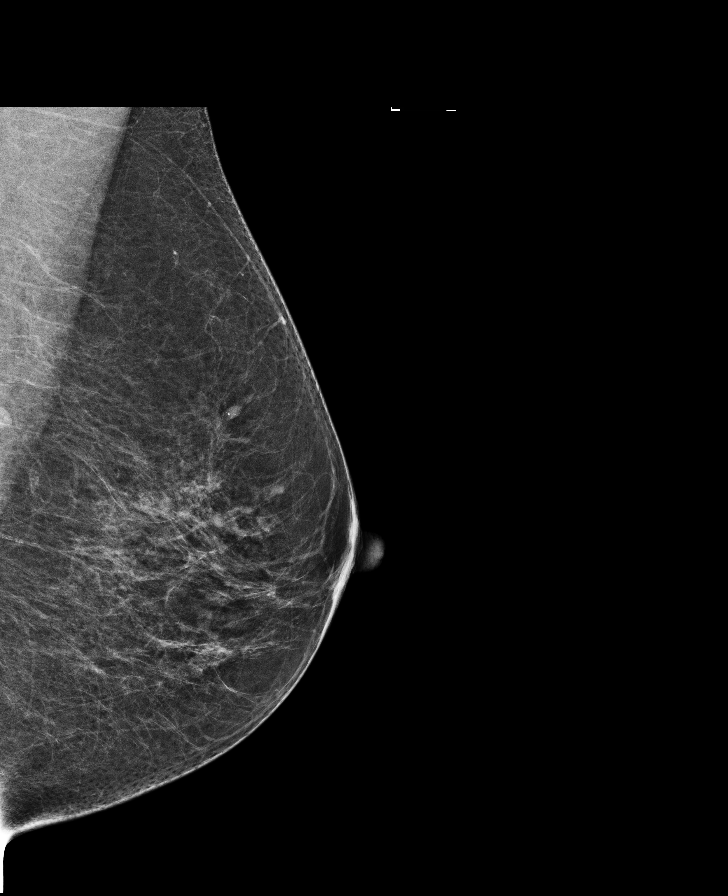

[R MLO]
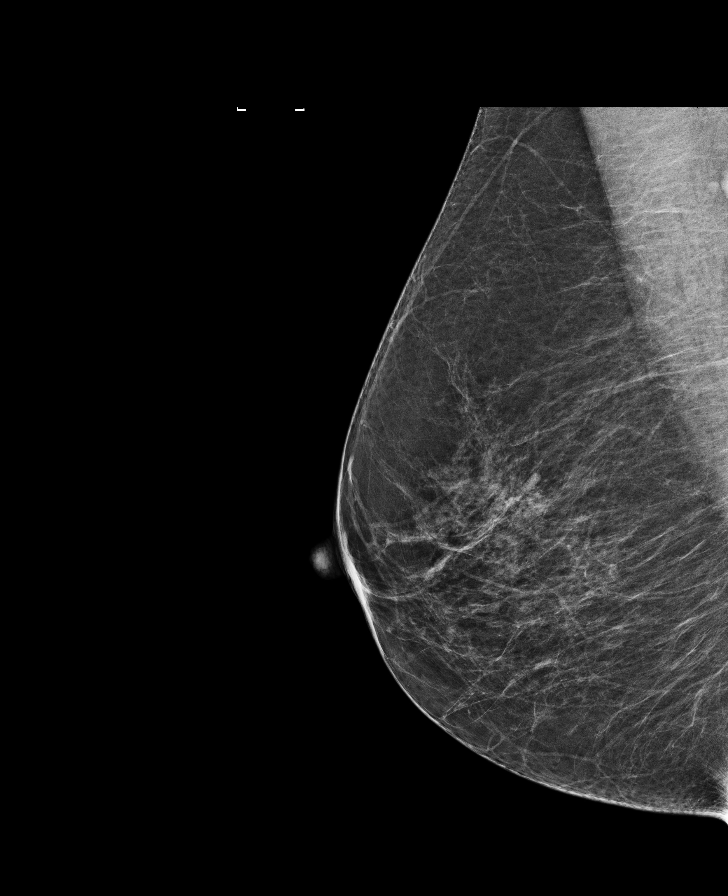

[R CC]
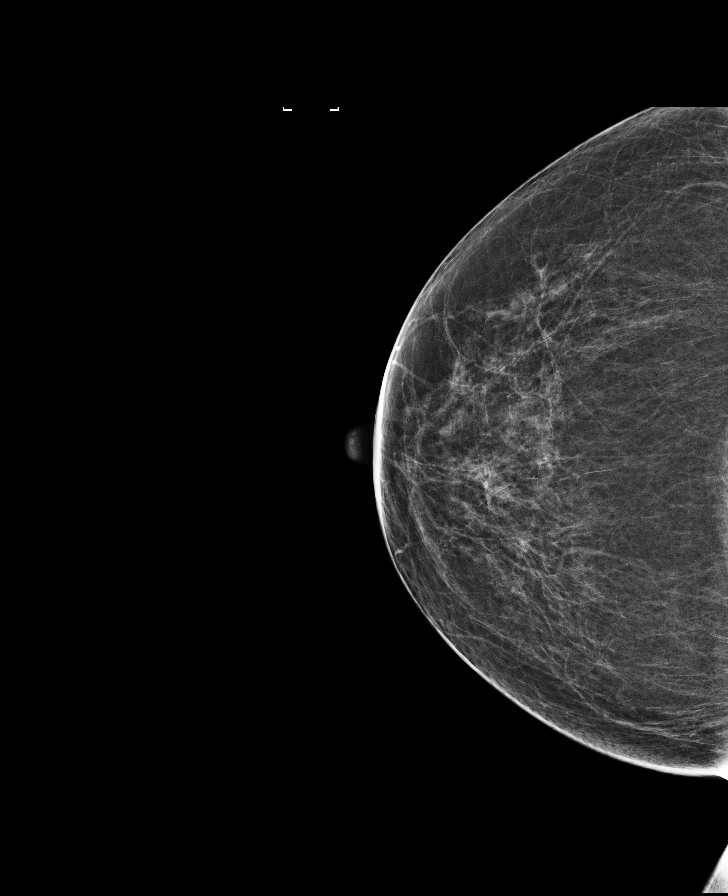

[L MLO (2 of 3)]
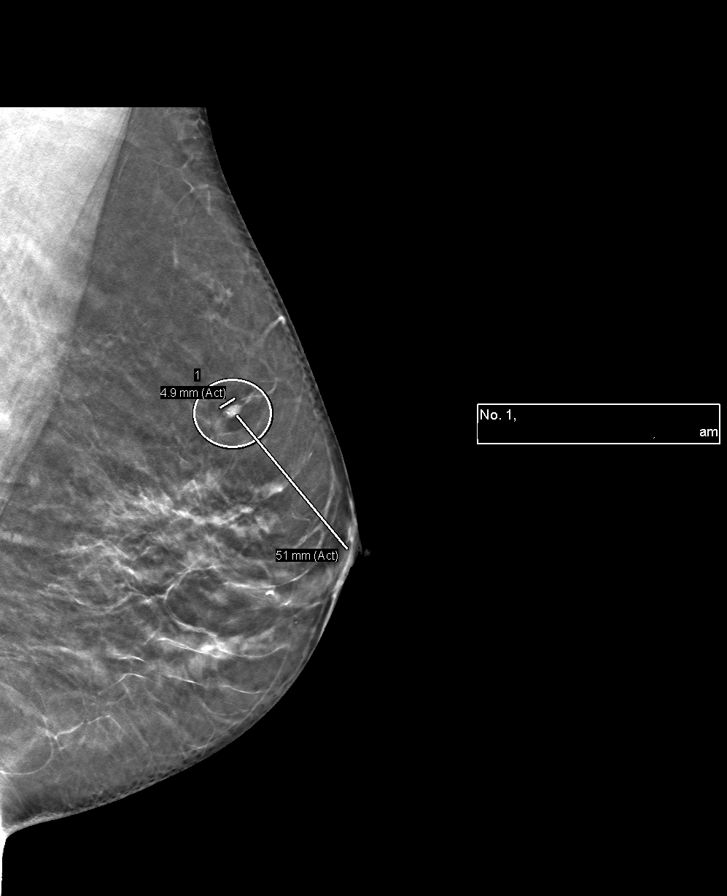

[L CC (2 of 3)]
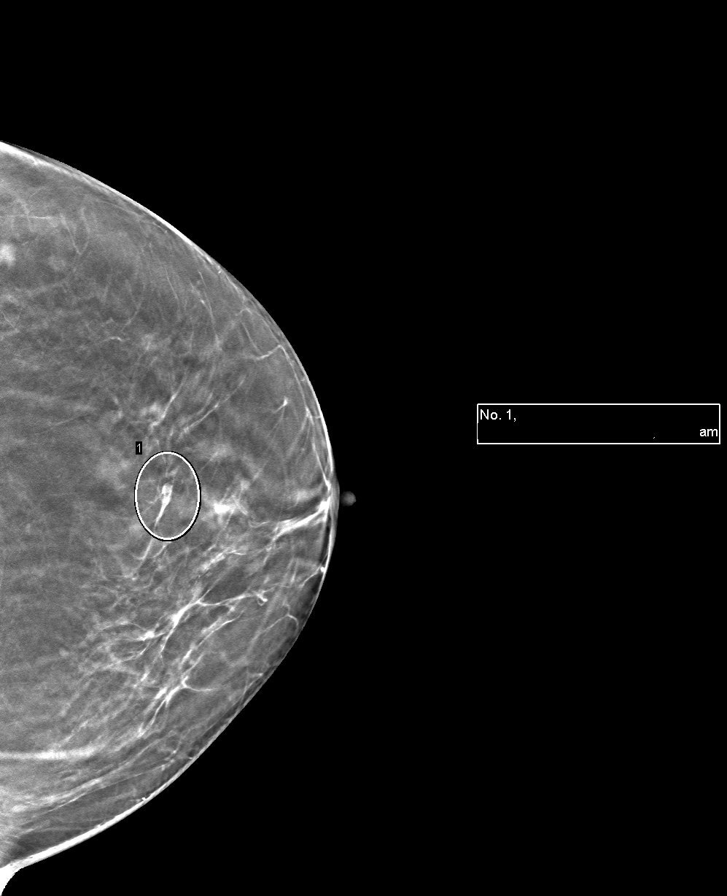

[L CC (3 of 3)]
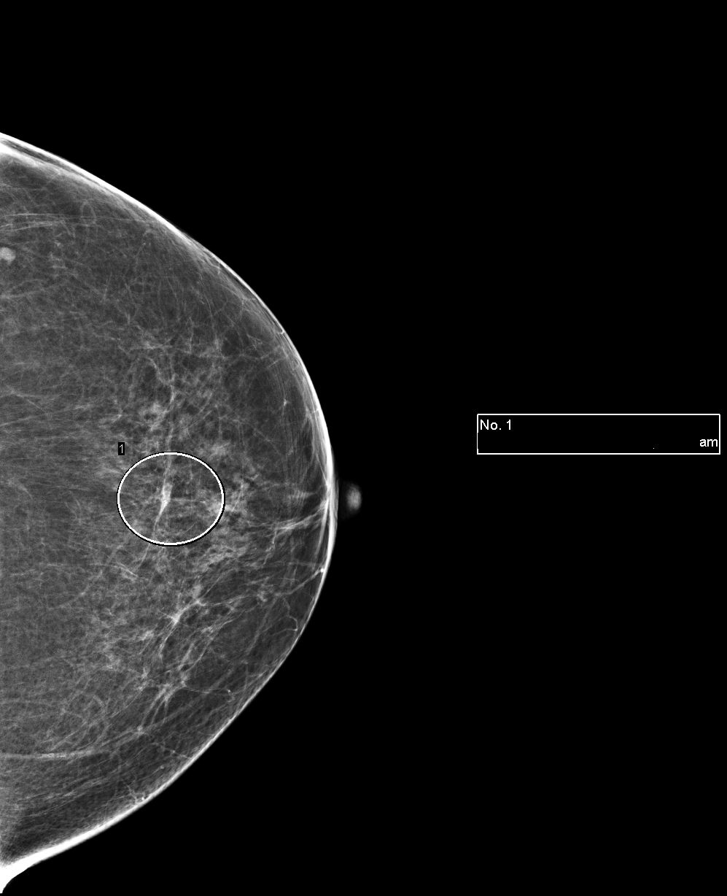

[L MLO (3 of 3)]
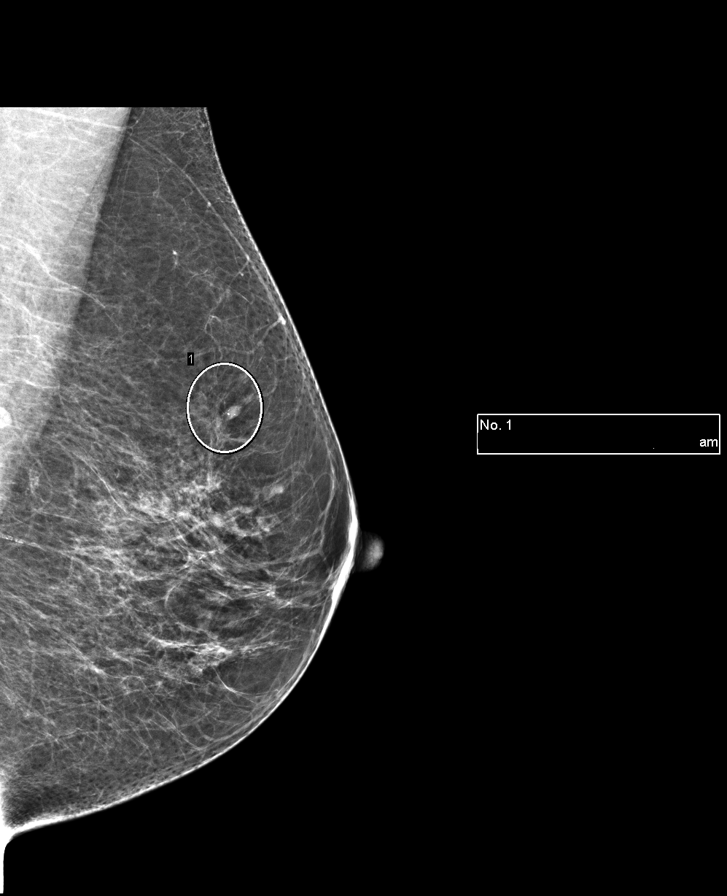

[8 of 28 positions shown; findings below may reference images not displayed]

IMPRESSION: Focal asymmetry with associated calcifications in the left breast requires additional evaluation. Diagnostic mammogram with possible ultrasound recommended.
BI-RADS Category 0: Incomplete: Needs Additional Imaging Evaluation

## 2022-07-20 IMAGING — CT CT LOW DOSE LUNG SCREENING
2 of 5 series · 14 of 36 positions shown, 17 images · non-contrast
Comparison: CT low-dose lung screening 07/16/21

Images Obtained from [facility]
HISTORY/INDICATION:  Screening. Former smoker. 1 ppd for 38 years.
TECHNIQUE: Scans of the chest are performed in the transaxial plane, reconstructed at 2 mm increments, without IV contrast using a low-dose scanning protocol. Coronal and sagittal lung
reconstructions are also obtained.  Siemens nodule finding program was used to assist pulmonary nodule detection.
Dose reduction technique used: Automated exposure control and adjustment of the mA and/or kV according to patient size. CT Studies and Cardiac Nuclear Medicine Studies in last 12-months = 1

[Series 4: lung · axial · 0.57mm/px · z∈[+1462,+1748]mm · 11 of 161 slices shown, 14 images]
[im 9/161  mediastinal]
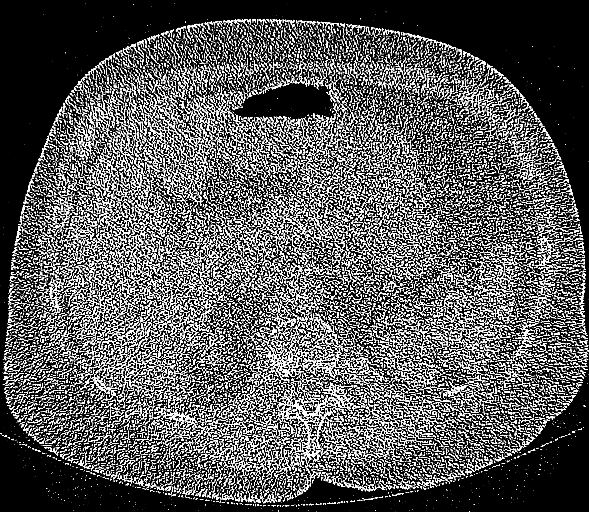
[im 9/161  lung]
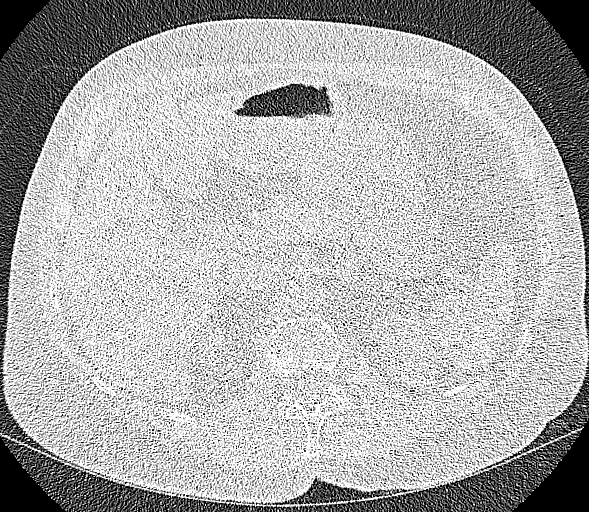
[im 26/161  lung]
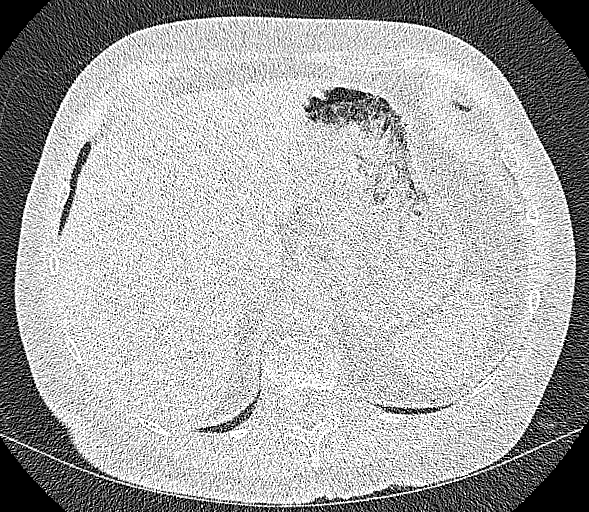
[im 43/161  lung]
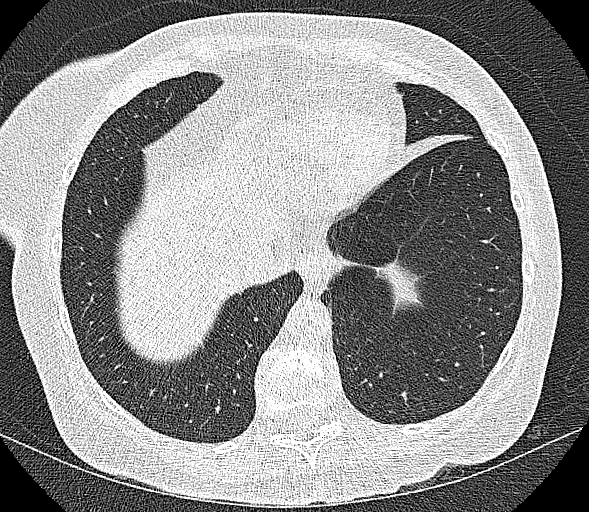
[im 51/161  lung]
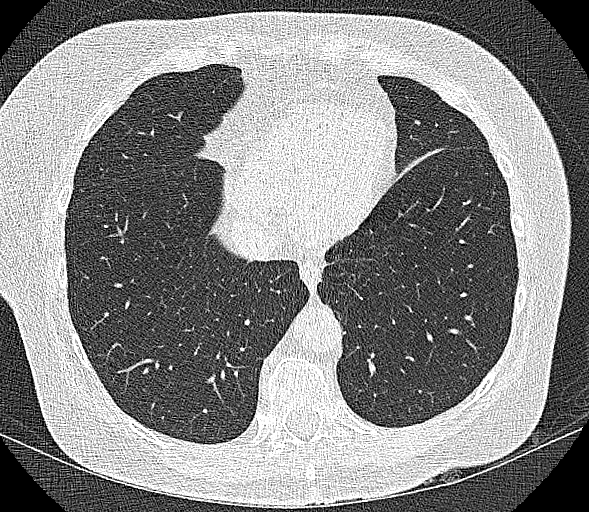
[im 68/161  mediastinal]
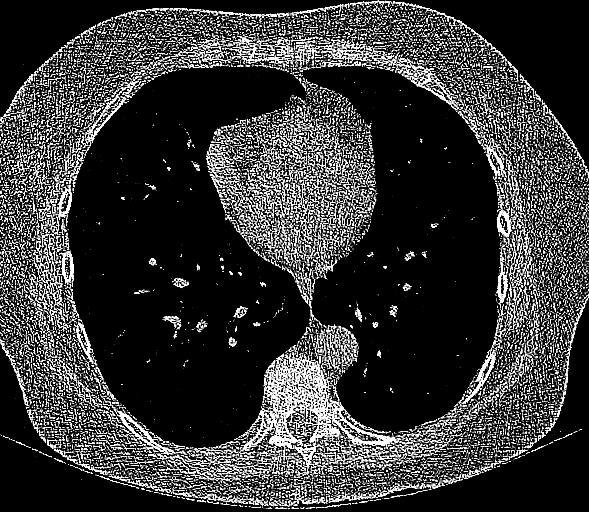
[im 68/161  lung]
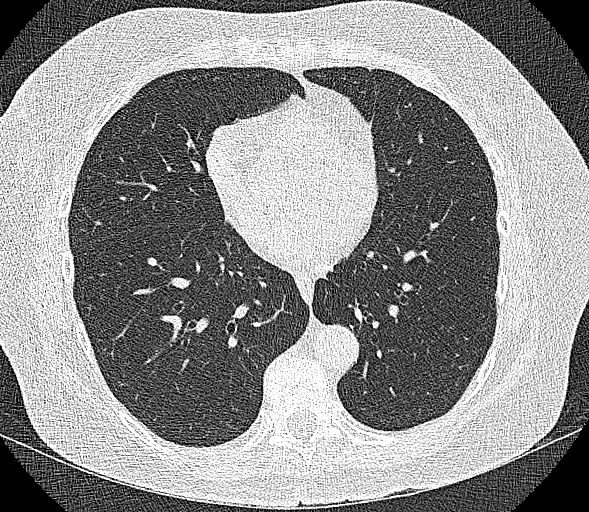
[im 85/161  lung]
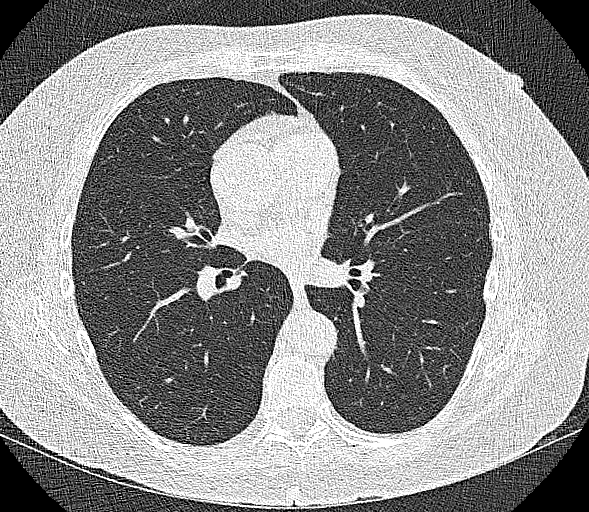
[im 93/161  lung]
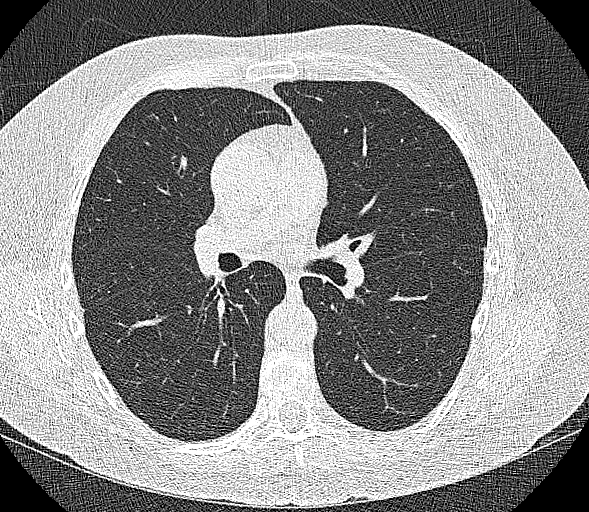
[im 110/161  lung]
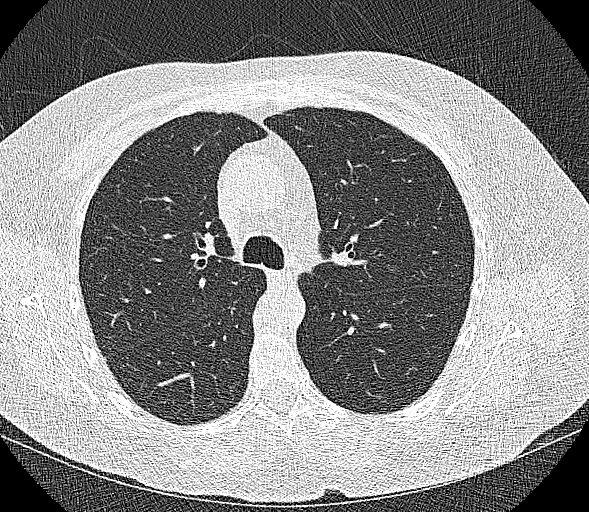
[im 118/161  mediastinal]
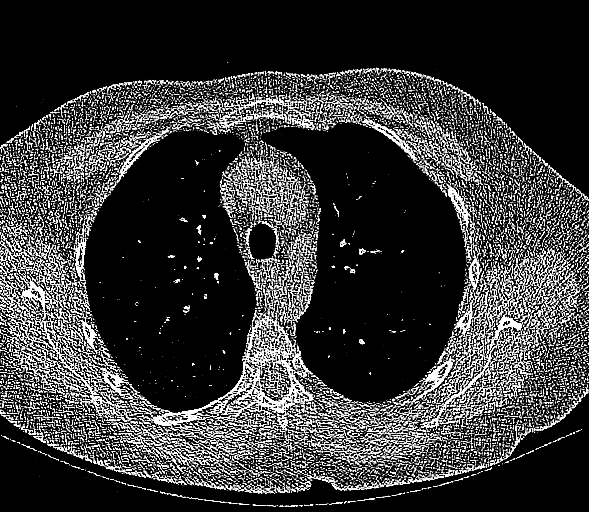
[im 118/161  lung]
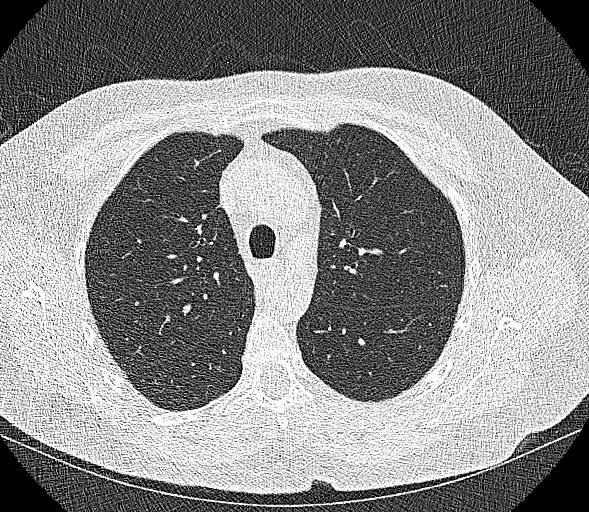
[im 135/161  lung]
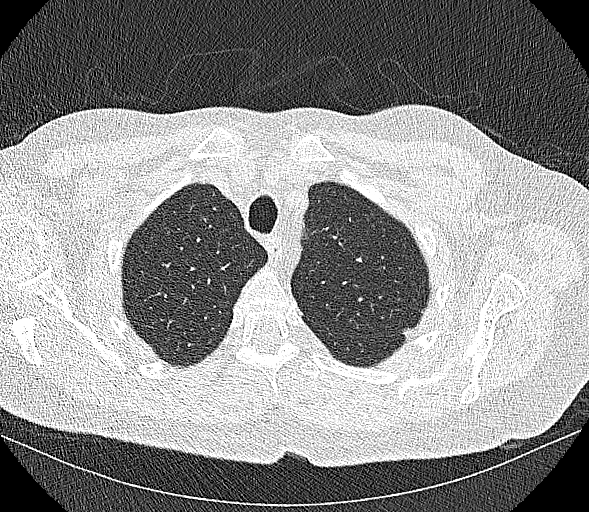
[im 152/161  lung]
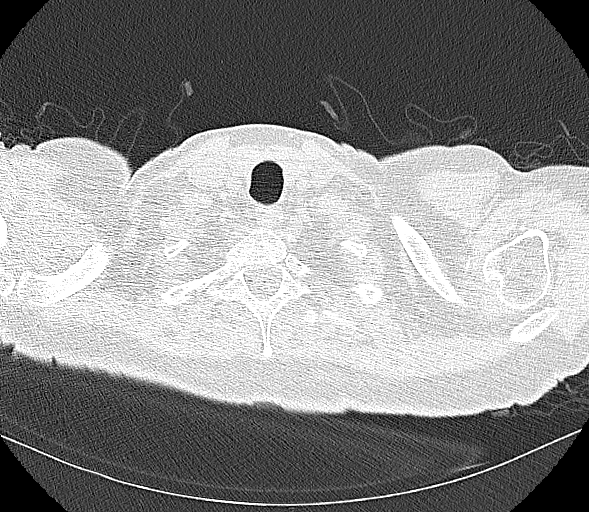

[Series 6: coronal · coronal · 0.63mm/px · 3 of 144 slices shown]
[im 29/144  lung]
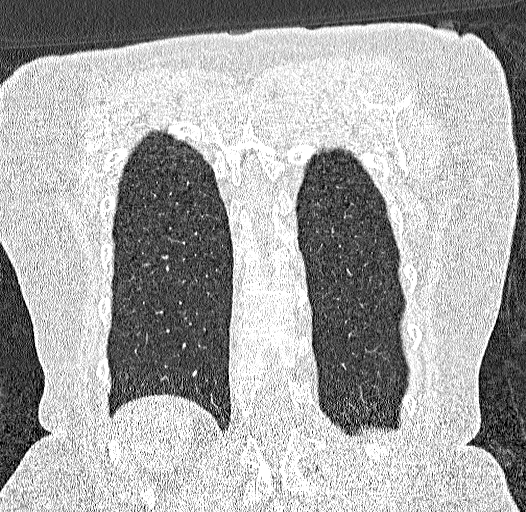
[im 58/144  lung]
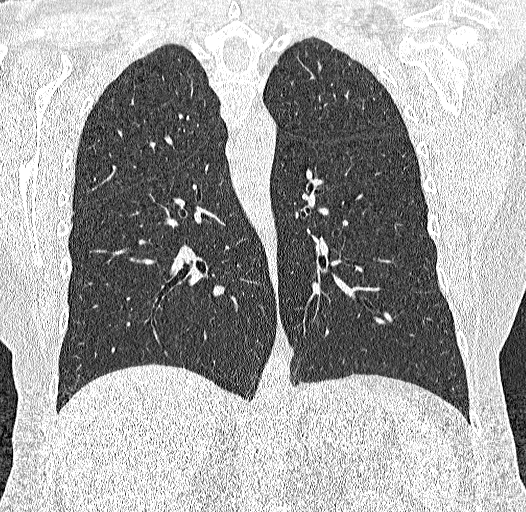
[im 86/144  lung]
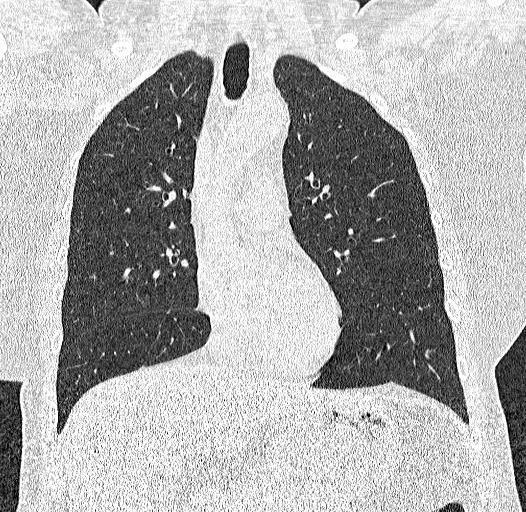

[14 of 36 positions shown; findings below may reference images not displayed]

FINDINGS: Calcified granuloma in the right lower lobe. No suspicious pulmonary nodule, mass or consolidation. Mild biapical scarring. No pleural effusion.
No cardiomegaly or pericardial effusion. Absent coronary artery calcifications. No lymphadenopathy.
No significant abnormality in the visualized upper abdomen. Mild degenerative changes in the thoracic spine.
IMPRESSION: 1.  Lung RADS Category 1
2.  No suspicious pulmonary lesion.
RECOMMENDATION:  If there is no history of tobacco use, no further low-dose CT chest screening is necessary.
If there is history of tobacco use, continue annual low-dose CT chest screening.
Total radiation dose to patient is CTDIvol 0.76 mGy and DLP 27.80 mGy-cm.

## 2022-07-21 IMAGING — MG MAMMO DIAG LT
2 series · 2 of 2 positions shown · non-contrast
Comparison: Multiple prior exams most recently 05/22/2022

Images Obtained from [facility]
******** ADDENDUM #1 ********
ADDENDUM:
There is a typographical error in the technique.
3D tomosynthesis was not done.
Remainder of report is unchanged.
******** ORIGINAL REPORT ********
INDICATION: Left breast mass.
TECHNIQUE: Left 2-D digital diagnostic mammogram was performed followed by 3-D tomosynthesis. Current study was also evaluated with a computer aided detection (CAD) system. Targeted left  breast
ultrasound was also performed.

[L CC]
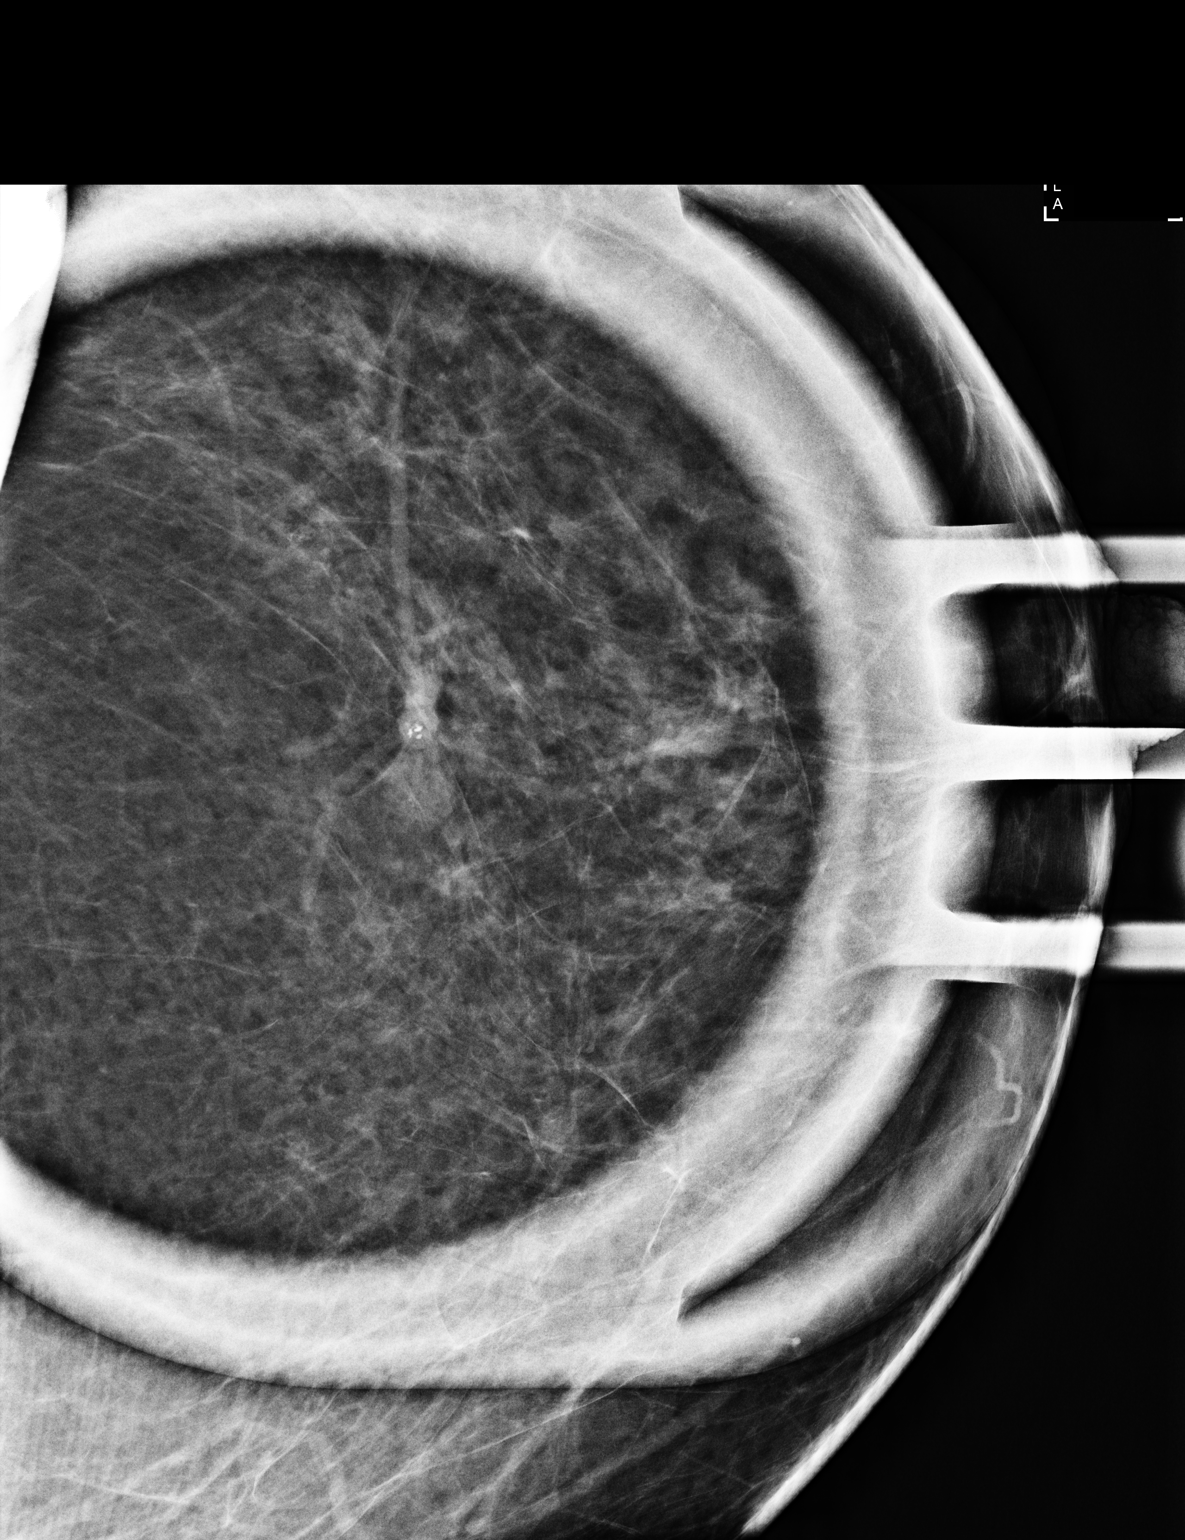

[L LM]
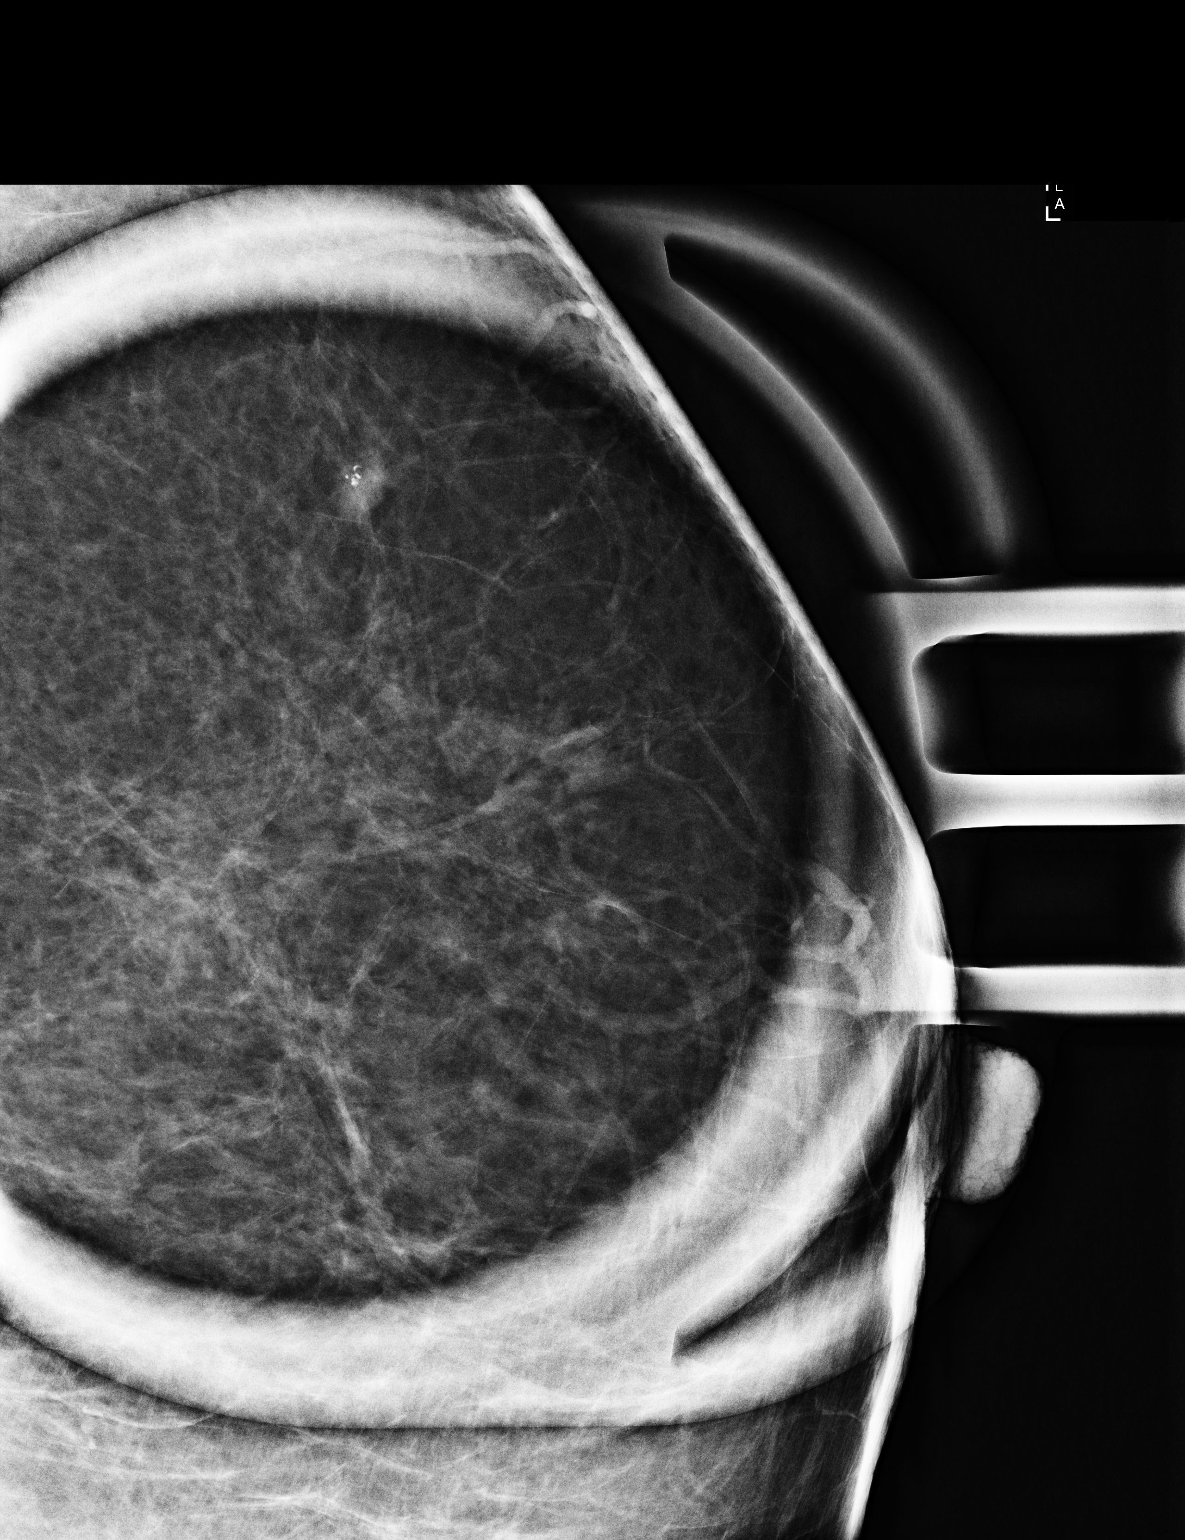

[2 of 2 positions shown; findings below may reference images not displayed]

FINDINGS: LEFT DIAGNOSTIC MAMMOGRAM:  There are scattered areas of fibroglandular density. There is a new irregular mass with associated calcifications in the left breast 12 o'clock middle depth.
TARGETED LEFT BREAST ULTRASOUND: In the left breast at 12 o'clock 5 cm from the nipple there is an irregular hypoechoic mass measuring 0.5 x 0.3 x 0.4 cm. This corresponds to the mammographic finding.
No enlarged left axillary lymph nodes.
IMPRESSION: The new 0.5 cm mass in the left breast 12 o'clock 5 cm from the nipple is suspicious. Ultrasound-guided core needle biopsy is recommended.
Findings and recommendations were discussed with the patient. She plans to schedule the procedure.
FINAL ASSESSMENT: BI-RADS: Category 4 Suspicious

## 2022-07-21 IMAGING — US US BREAST LT LTD
1 series · 13 of 13 positions shown · non-contrast
Comparison: Multiple prior exams most recently 05/22/2022

Images Obtained from [facility]
******** ADDENDUM #1 ********
ADDENDUM:
There is a typographical error in the technique.
3D tomosynthesis was not done.
Remainder of report is unchanged.
******** ORIGINAL REPORT ********
INDICATION: Left breast mass.
TECHNIQUE: Left 2-D digital diagnostic mammogram was performed followed by 3-D tomosynthesis. Current study was also evaluated with a computer aided detection (CAD) system. Targeted left  breast
ultrasound was also performed.

[Series 2: us breast left ltd · 13 of 13 slices shown]
[im 1/13]
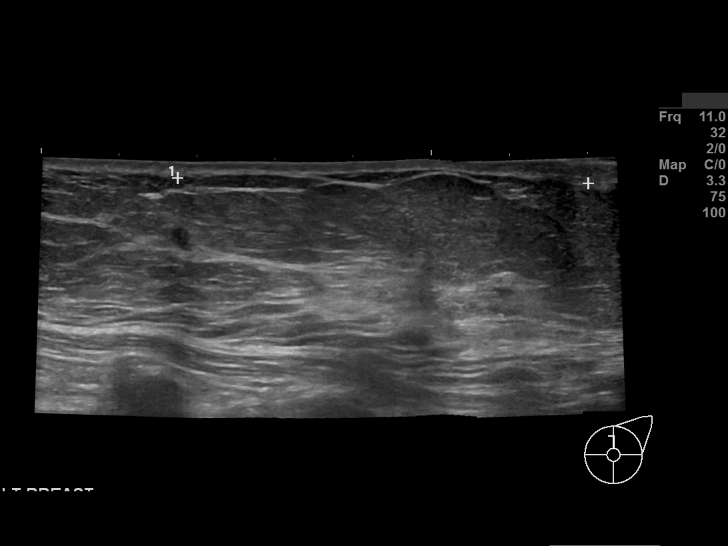
[im 2/13]
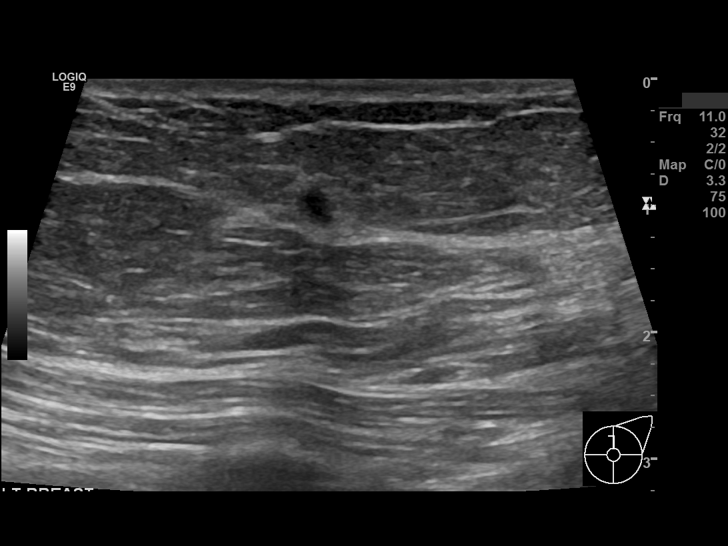
[im 3/13]
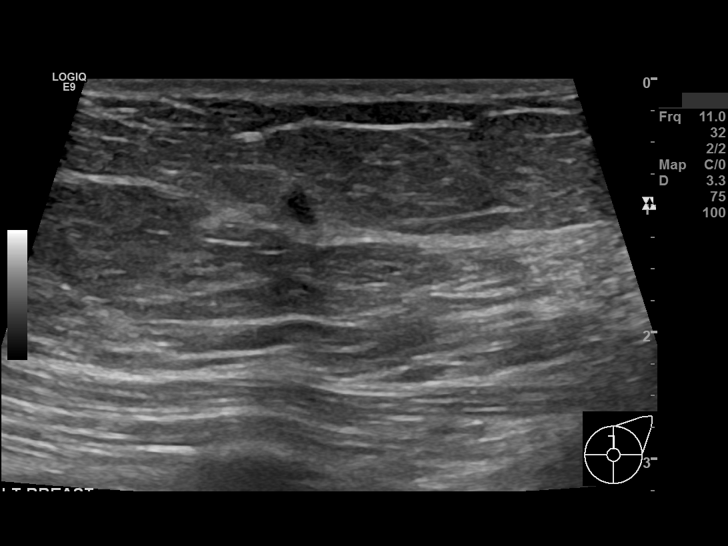
[im 4/13]
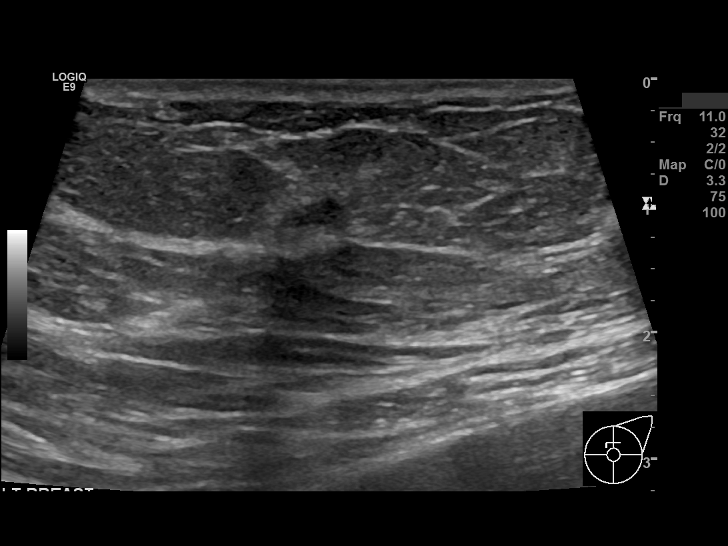
[im 5/13]
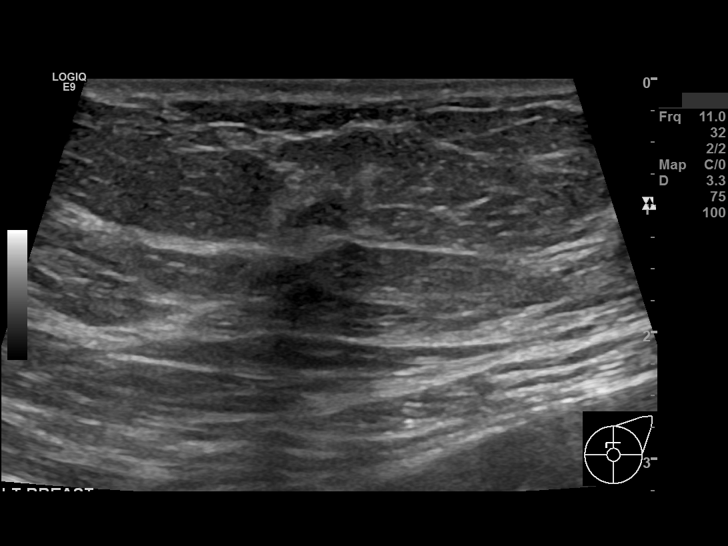
[im 6/13]
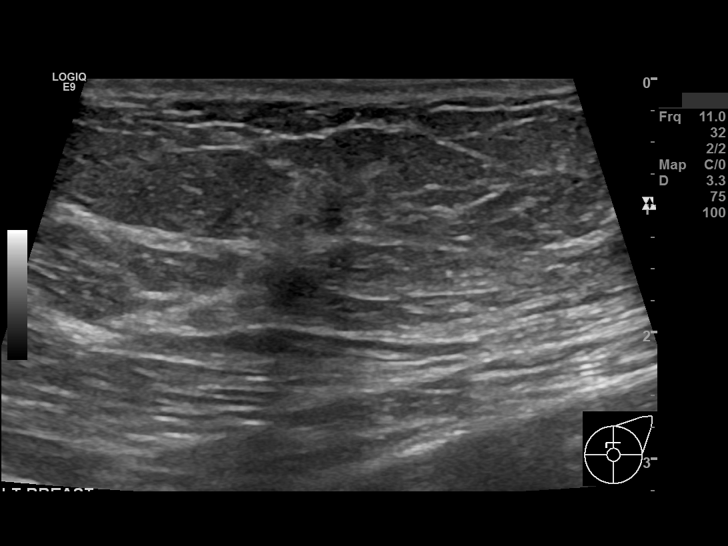
[im 7/13]
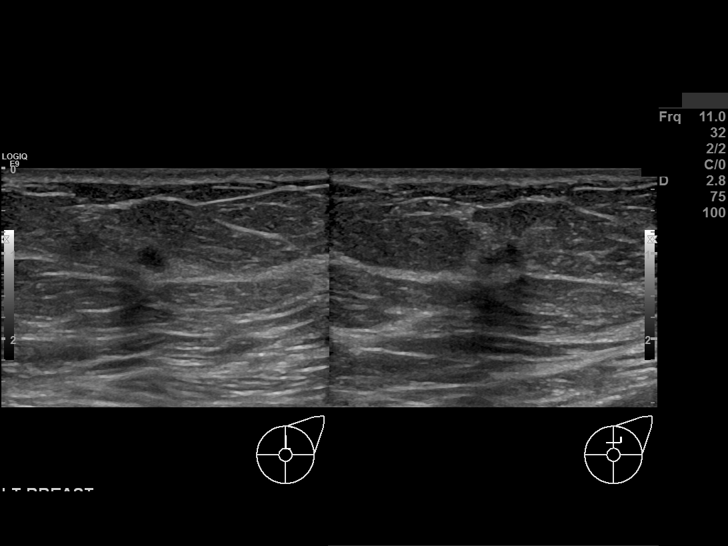
[im 8/13]
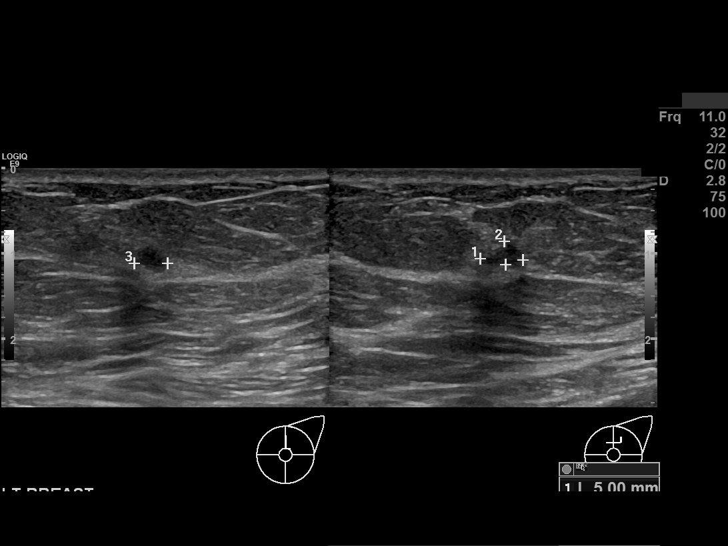
[im 9/13]
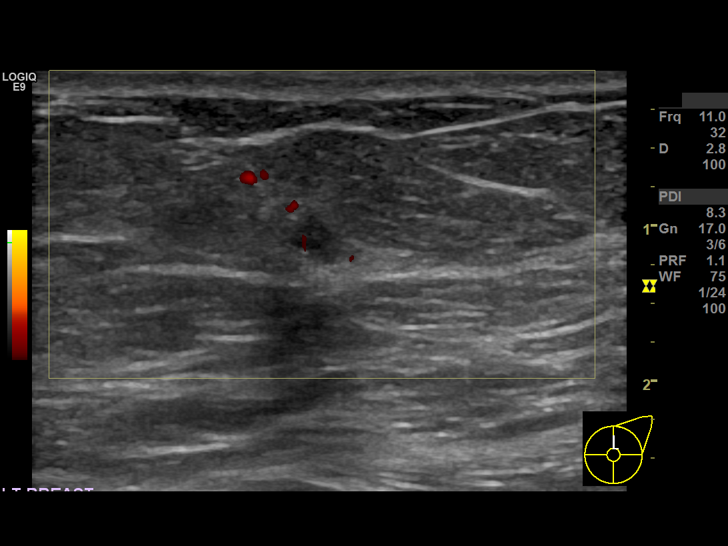
[im 10/13]
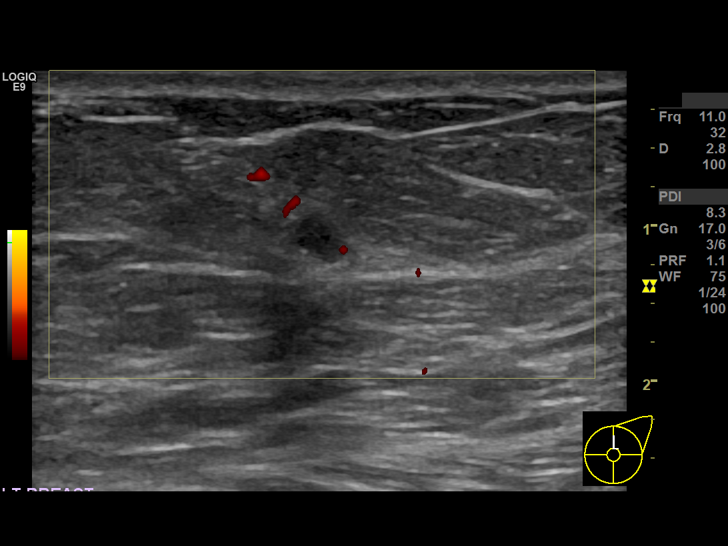
[im 11/13]
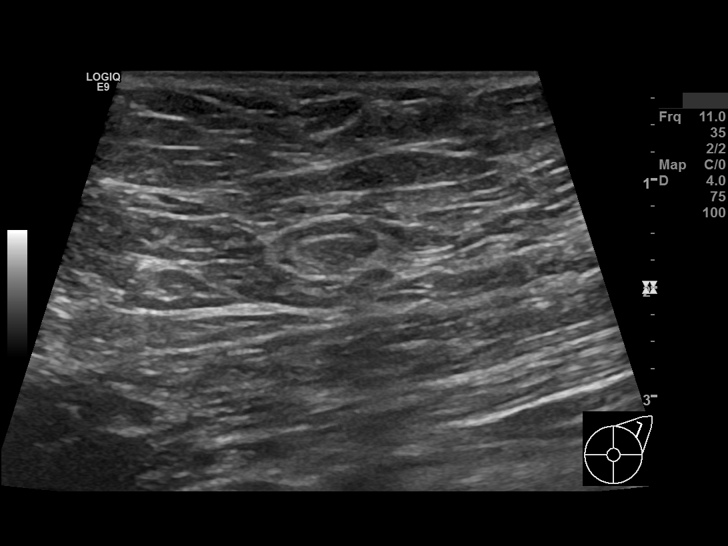
[im 12/13]
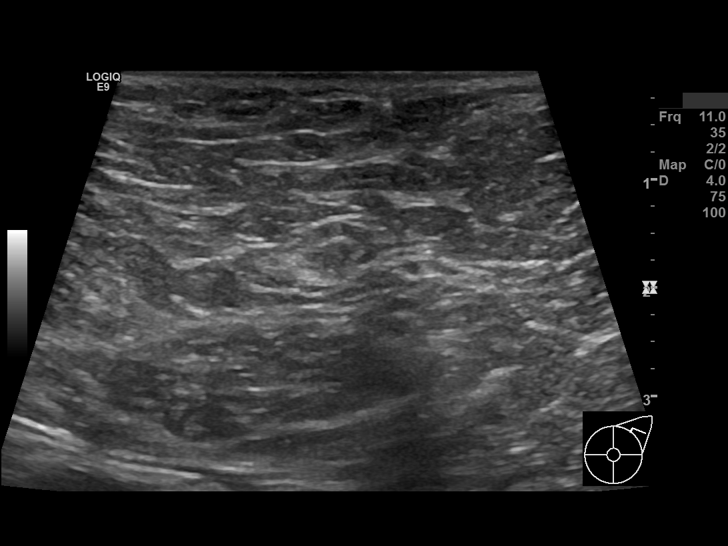
[im 13/13]
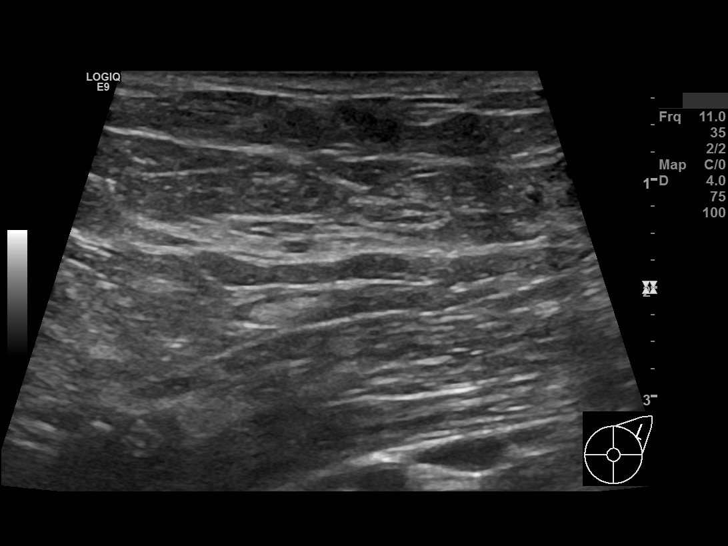

[13 of 13 positions shown; findings below may reference images not displayed]

FINDINGS: LEFT DIAGNOSTIC MAMMOGRAM:  There are scattered areas of fibroglandular density. There is a new irregular mass with associated calcifications in the left breast 12 o'clock middle depth.
TARGETED LEFT BREAST ULTRASOUND: In the left breast at 12 o'clock 5 cm from the nipple there is an irregular hypoechoic mass measuring 0.5 x 0.3 x 0.4 cm. This corresponds to the mammographic finding.
No enlarged left axillary lymph nodes.
IMPRESSION: The new 0.5 cm mass in the left breast 12 o'clock 5 cm from the nipple is suspicious. Ultrasound-guided core needle biopsy is recommended.
Findings and recommendations were discussed with the patient. She plans to schedule the procedure.
FINAL ASSESSMENT: BI-RADS: Category 4 Suspicious

## 2022-08-07 IMAGING — US BX BREAST US GUIDED LT
1 series · 13 of 13 positions shown · non-contrast
Comparison: Multiple priors the most recent dated 07/21/2022
PROCEDURE:
PATIENT CONSENT: Prior to the procedure risks, complications, alternatives and a description of the procedure were discussed.

Images Obtained from [facility]
INDICATION: 57-year-old woman here for ultrasound-guided biopsy of the left breast.

[Series 1: bx breast us guided left · 13 of 13 slices shown]
[im 1/13]
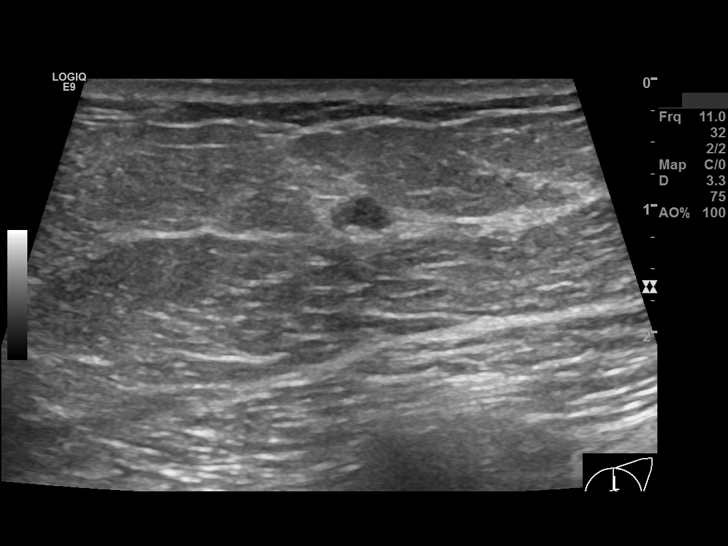
[im 2/13]
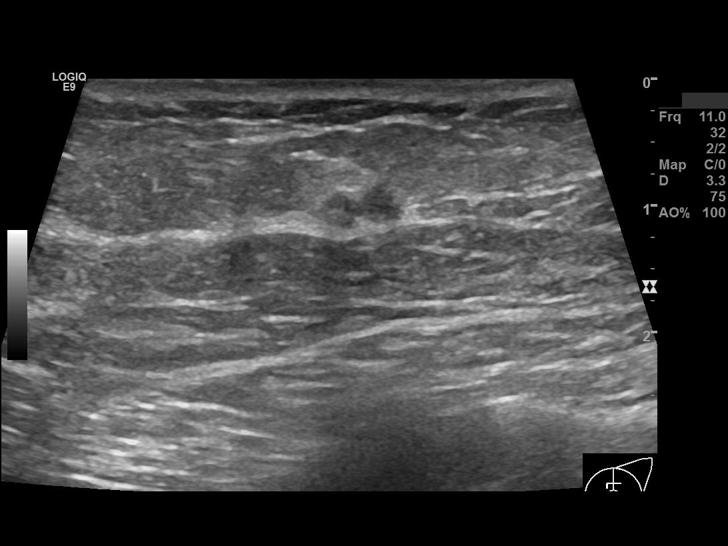
[im 3/13]
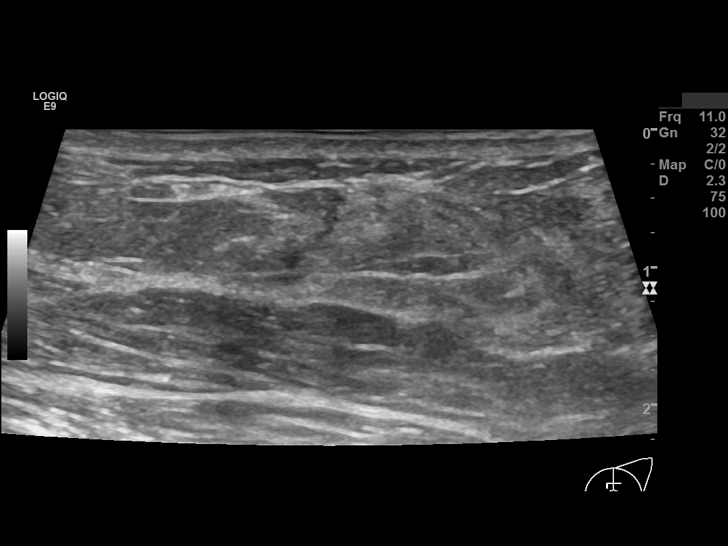
[im 4/13]
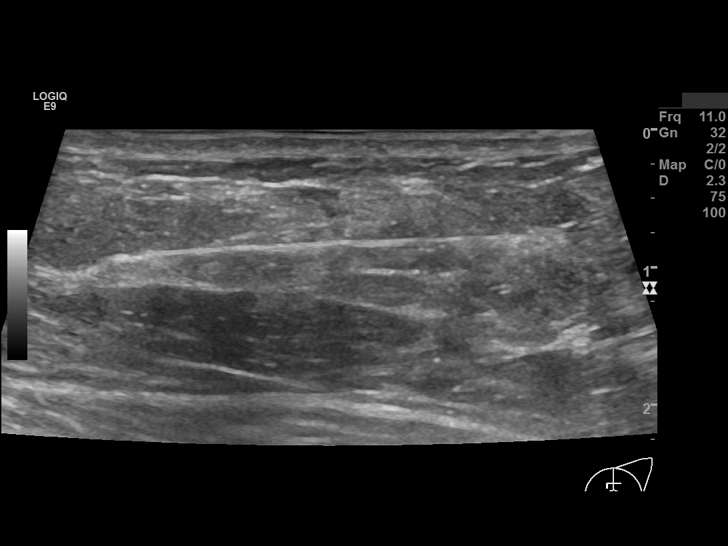
[im 5/13]
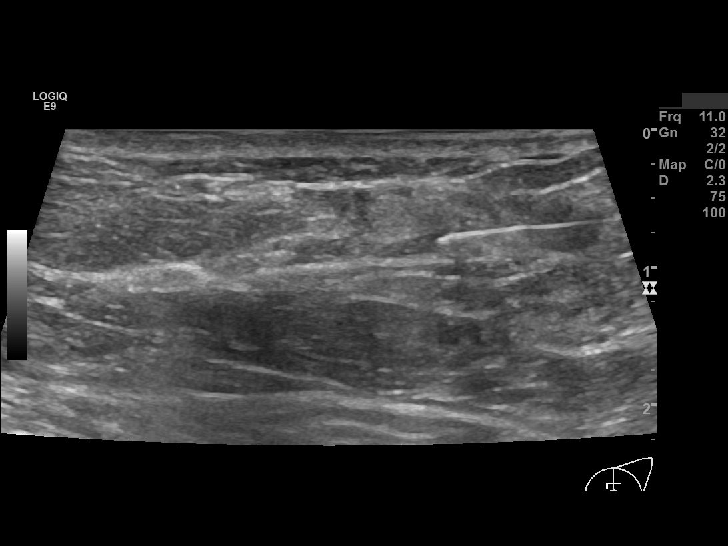
[im 6/13]
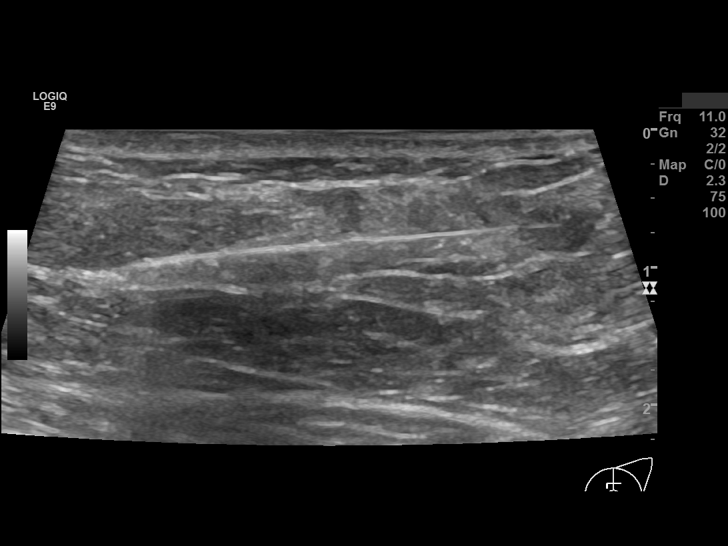
[im 7/13]
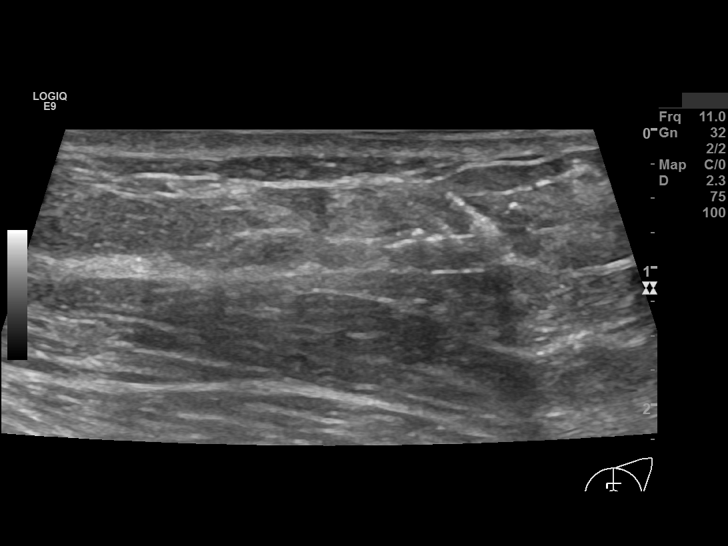
[im 8/13]
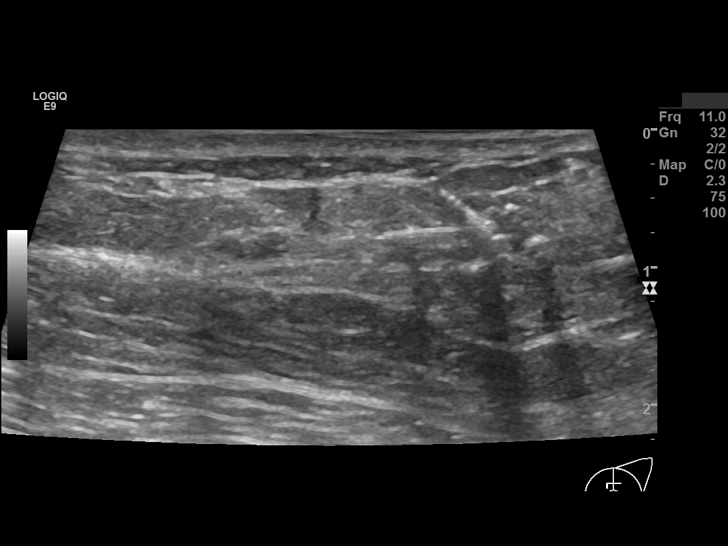
[im 9/13]
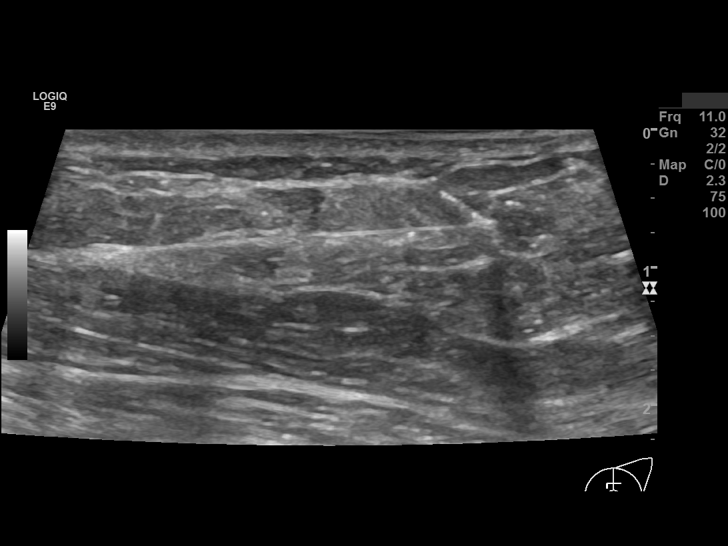
[im 10/13]
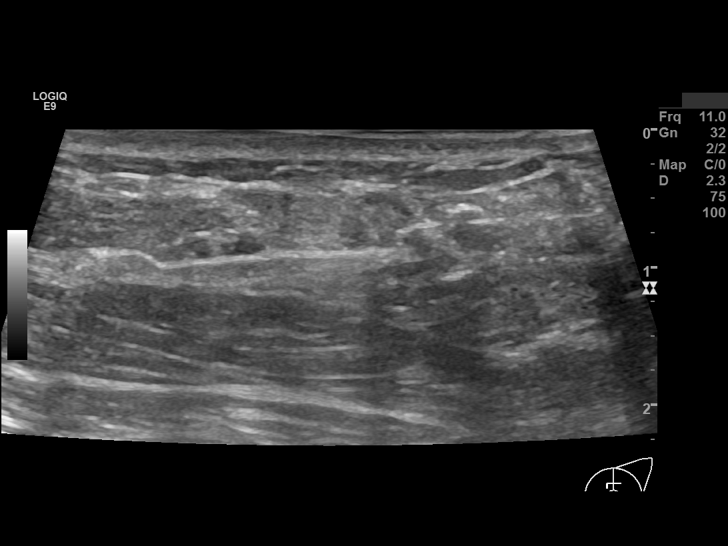
[im 11/13]
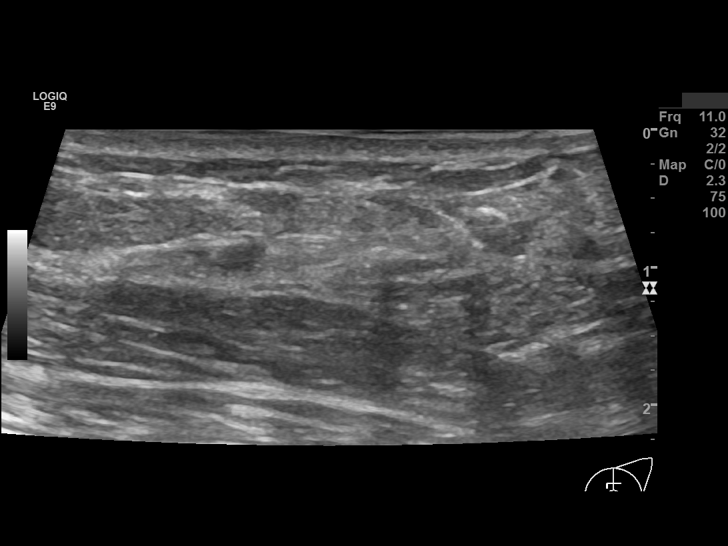
[im 12/13]
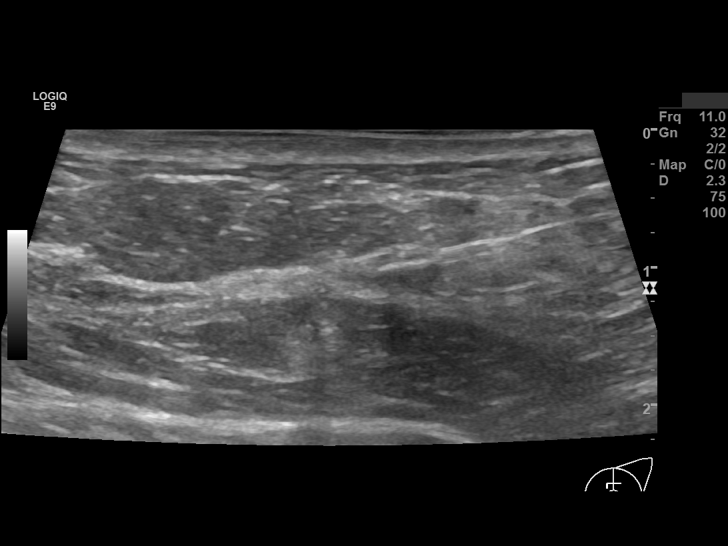
[im 13/13]
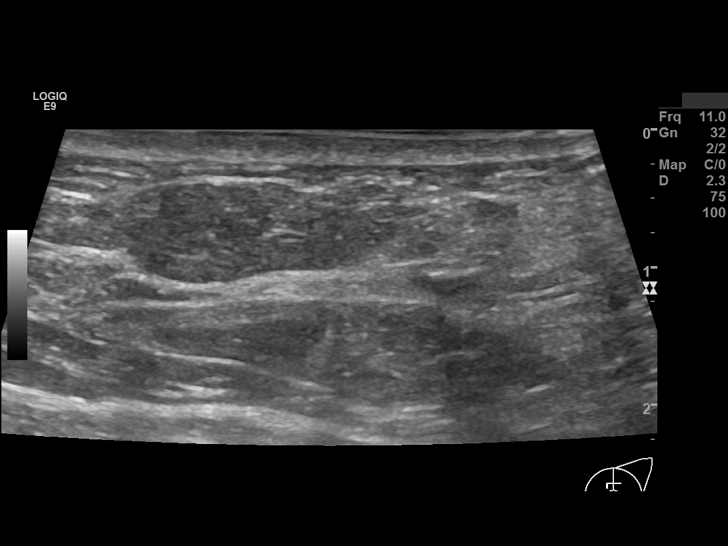

[13 of 13 positions shown; findings below may reference images not displayed]

All questions were answered.  Informed consent was obtained and signed.
PROCEDURE DESCRIPTION:
An ultrasound guided biopsy using real-time ultrasound was performed for the 0.5 cm mass in the left breast [DATE] 5 cm from the nipple.  This was described on the previous ultrasound report.  The
skin was prepped in the usual manner.  Local anesthetic was administered to the access site.  A skin nick was made in the breast.  A 14 gauge biopsy needle was placed adjacent to the abnormality
under ultrasound guidance.  Once the needle was documented to be in the correct location, multiple specimens were obtained using the Marquee biopsy needle.  A Hydromark clip was inserted into the
biopsy cavity.  A skin adhesive was applied to the access site.  Post procedure digital mammographic imaging with tomosynthesis demonstrates the clip at the targeted area.  The specimens were sent to
the laboratory for pathological analysis.
A female technologist was present throughout the procedure.
IMPRESSION: Malignant
Ultrasound guided biopsy of the 0.5 cm left breast mass was successful with no apparent post procedure complications.  Pathology indicates high-grade DCIS. Radiology and pathology results are
concordant. Surgery and oncology consultations are recommended.
Results and recommendations were discussed with the patient.

## 2022-08-07 IMAGING — MG MAMMO DIAG LT W/CAD TOMO
4 series · 4 of 12 positions shown · non-contrast
Comparison: Multiple priors the most recent dated 07/21/2022
PROCEDURE:
PATIENT CONSENT: Prior to the procedure risks, complications, alternatives and a description of the procedure were discussed.

Images Obtained from [facility]
INDICATION: 57-year-old woman here for ultrasound-guided biopsy of the left breast.

[L CC]
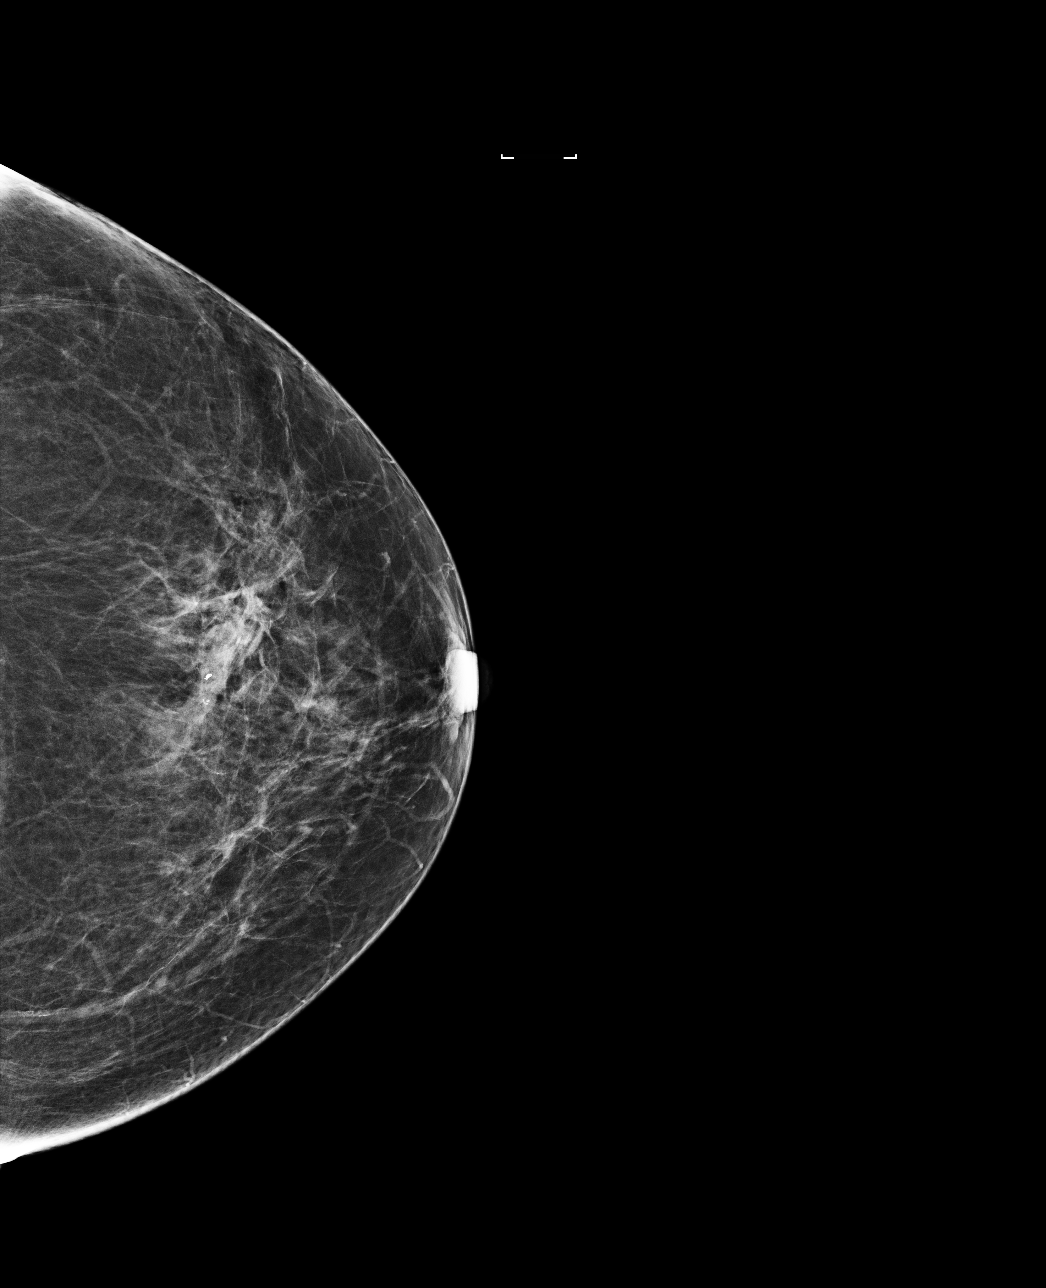

[L LM]
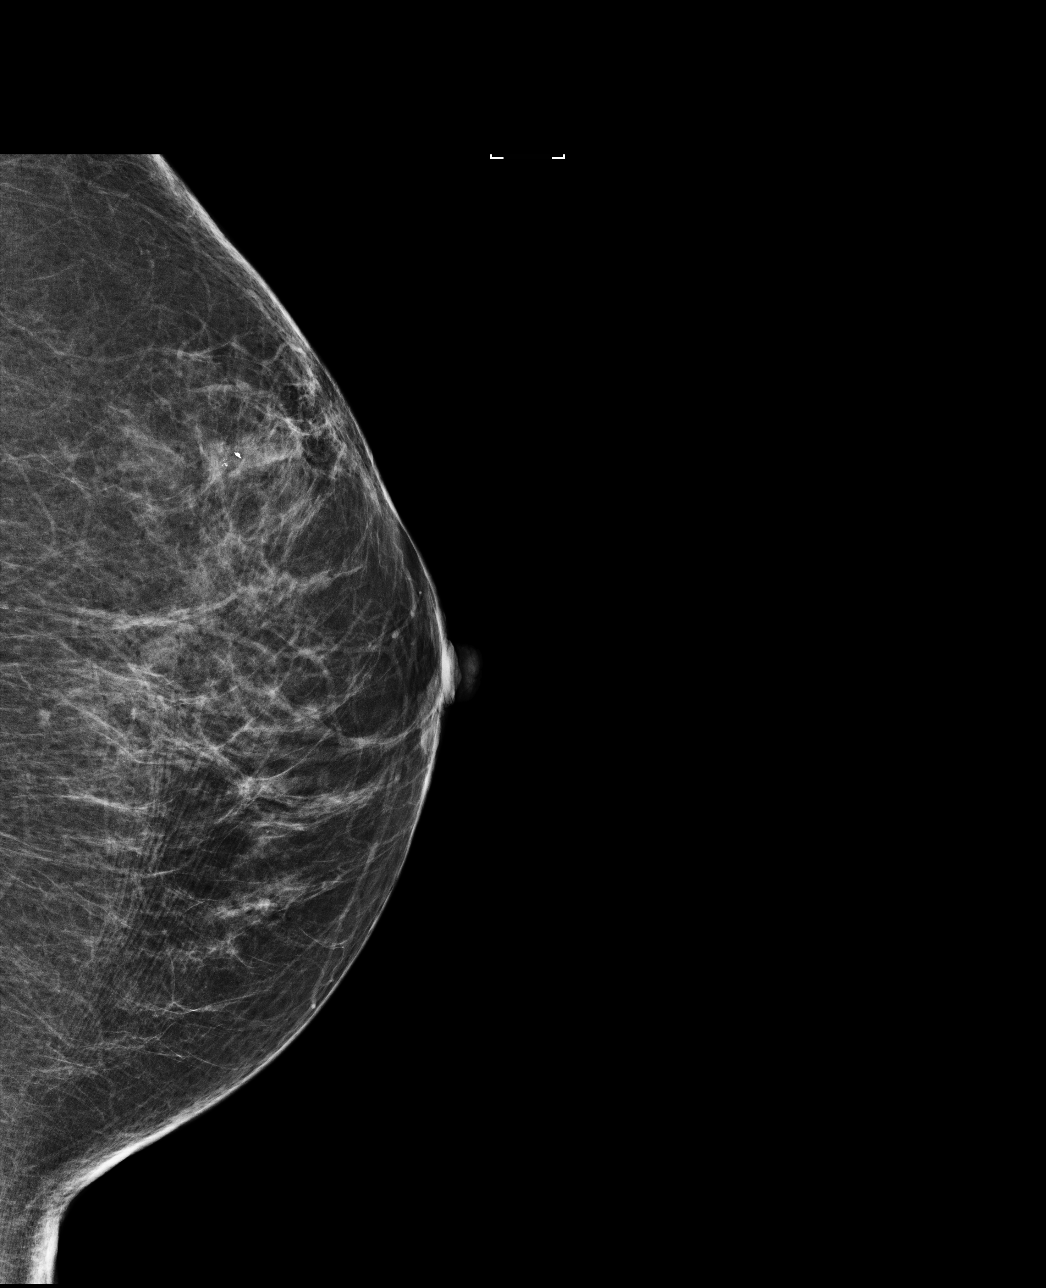

[L LM tomo · tomo slice 31/62.0]
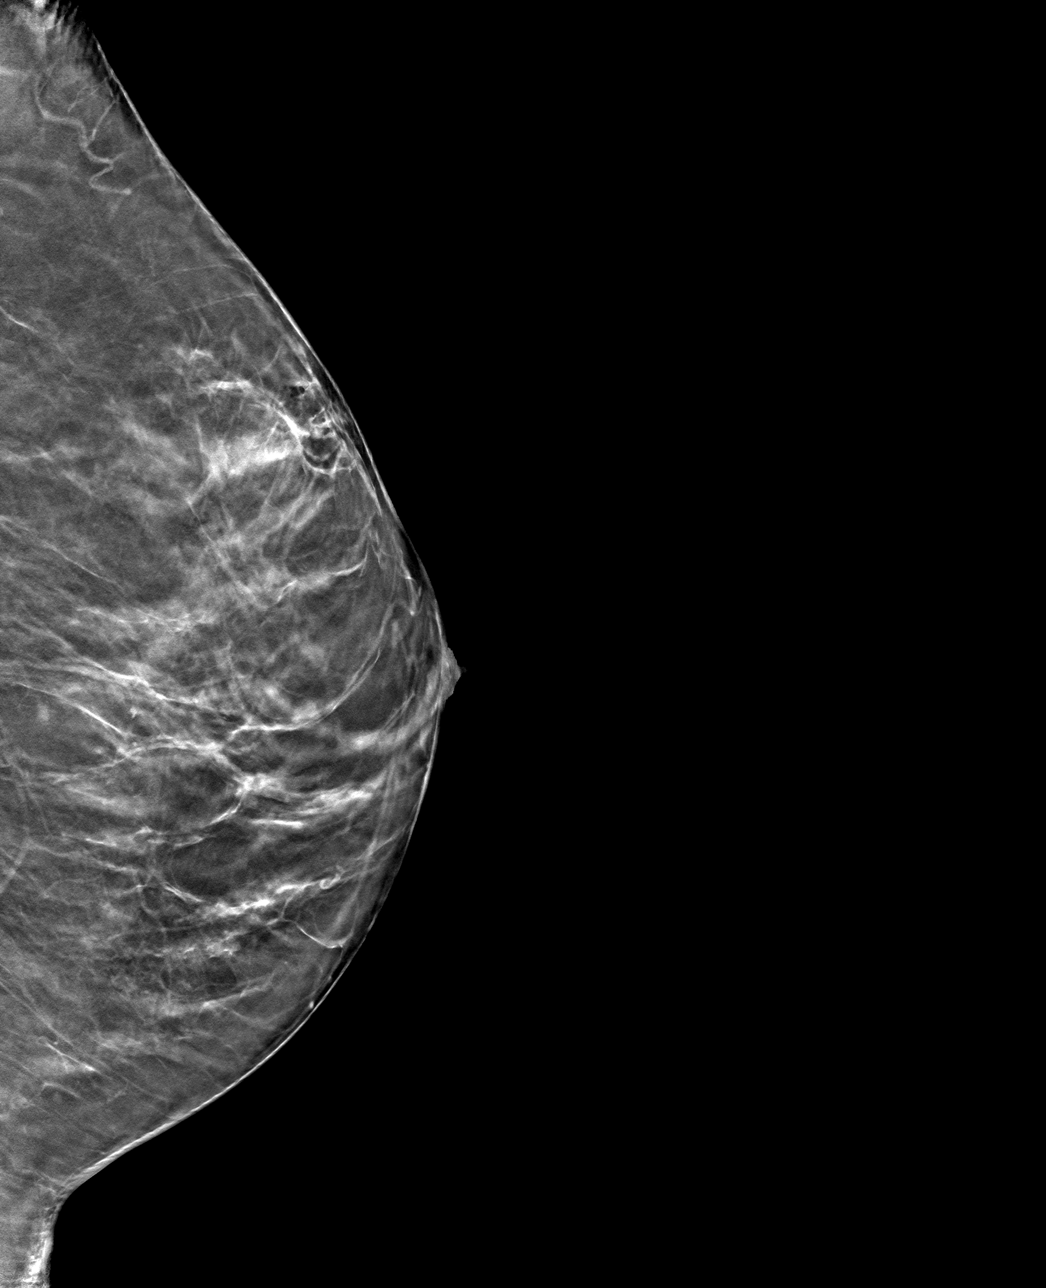

[L CC tomo · tomo slice 33/64.0]
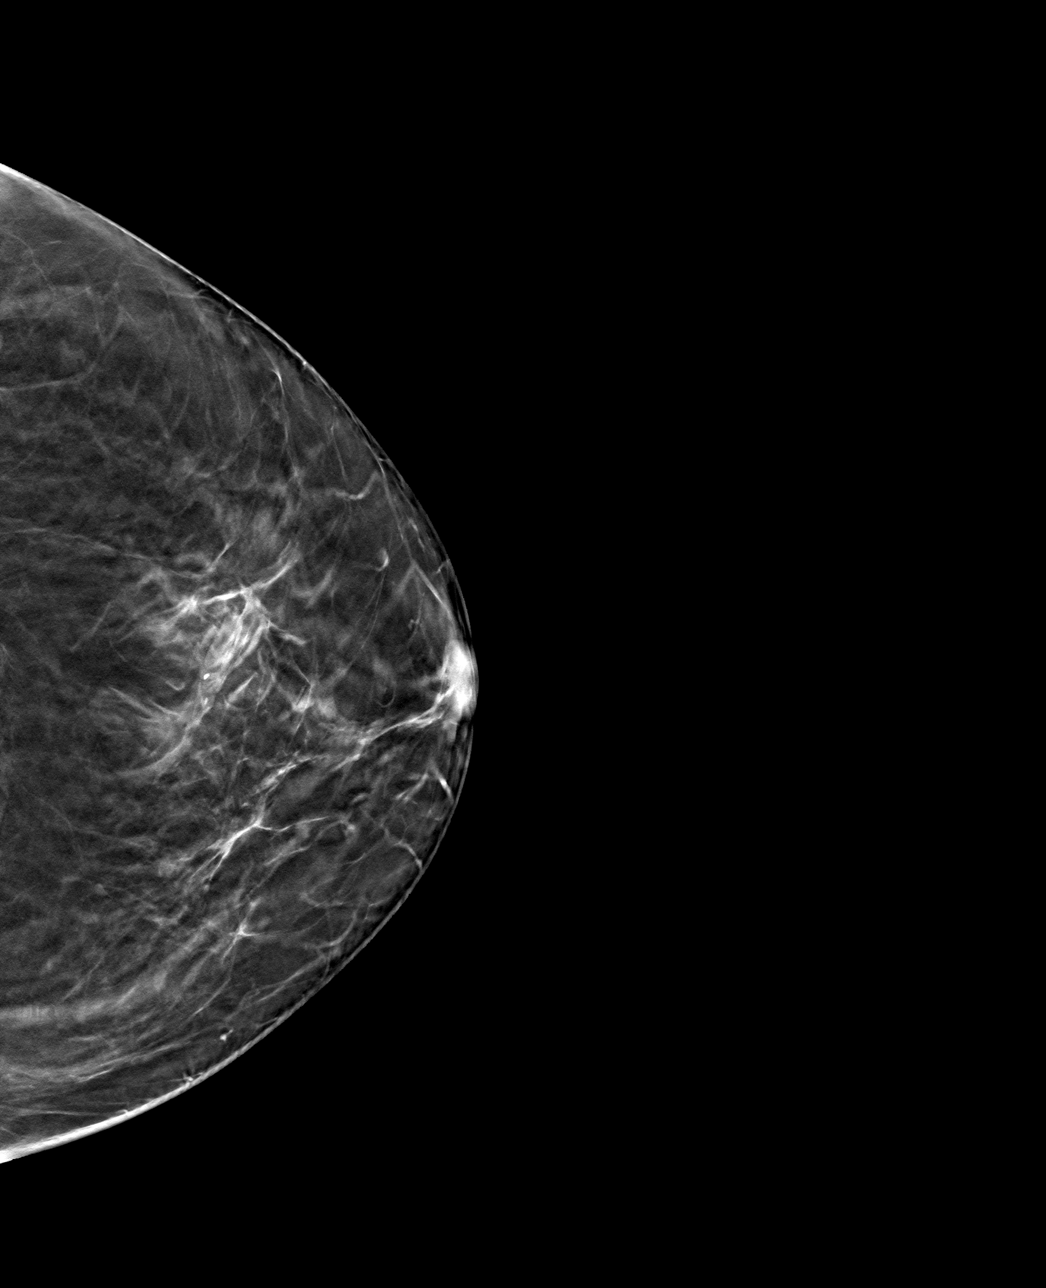

[4 of 12 positions shown; findings below may reference images not displayed]

All questions were answered.  Informed consent was obtained and signed.
PROCEDURE DESCRIPTION:
An ultrasound guided biopsy using real-time ultrasound was performed for the 0.5 cm mass in the left breast [DATE] 5 cm from the nipple.  This was described on the previous ultrasound report.  The
skin was prepped in the usual manner.  Local anesthetic was administered to the access site.  A skin nick was made in the breast.  A 14 gauge biopsy needle was placed adjacent to the abnormality
under ultrasound guidance.  Once the needle was documented to be in the correct location, multiple specimens were obtained using the Marquee biopsy needle.  A Hydromark clip was inserted into the
biopsy cavity.  A skin adhesive was applied to the access site.  Post procedure digital mammographic imaging with tomosynthesis demonstrates the clip at the targeted area.  The specimens were sent to
the laboratory for pathological analysis.
A female technologist was present throughout the procedure.
IMPRESSION: Malignant
Ultrasound guided biopsy of the 0.5 cm left breast mass was successful with no apparent post procedure complications.  Pathology indicates high-grade DCIS. Radiology and pathology results are
concordant. Surgery and oncology consultations are recommended.
Results and recommendations were discussed with the patient.

## 2022-08-25 IMAGING — MR MRI GUIDED LT BREAST BX
6 of 7 series · 39 of 48 positions shown · non-contrast
Comparison: Previous breast MRI exam to 15/07/2022
DESCRIPTION OF PROCEDURE: The patient was positioned prone in the MRI machine and the left breast was placed in mild compression.

Images Obtained from [facility]
INDICATION: unspecified lump in the left breast, overlapping quadrants. 0.5 cm enhancing lesion in the left breast retroareolar region at [DATE]. Recently diagnosed ductal carcinoma in situ left breast

[Series 3: t1_(person_name)3(person_name)_(person_name)_(person_name)_(person_name)_pre · axial · 1.0mm · 0.94mm/px · z∈[-114,+93]mm · 6 of 207 slices shown]
[im 1/207]
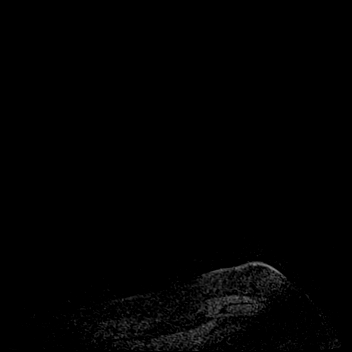
[im 42/207]
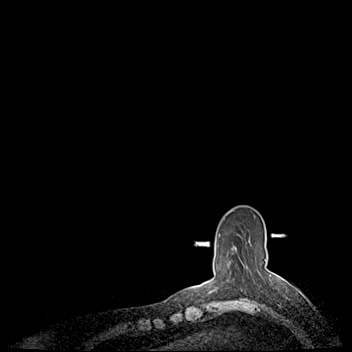
[im 83/207]
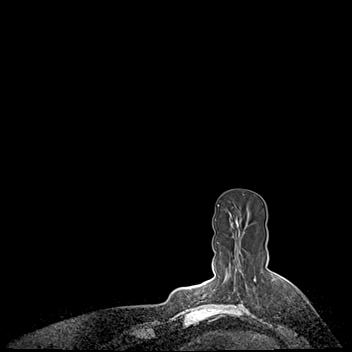
[im 124/207]
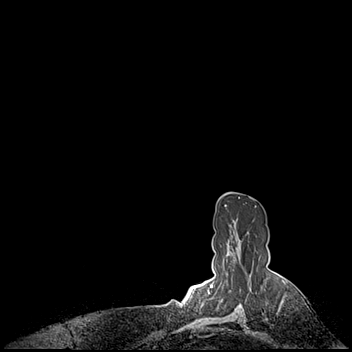
[im 165/207]
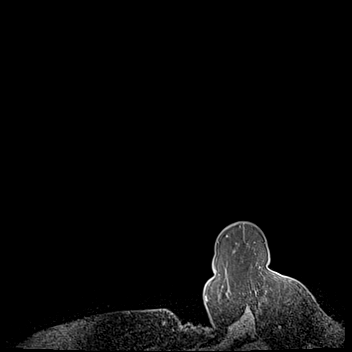
[im 207/207]
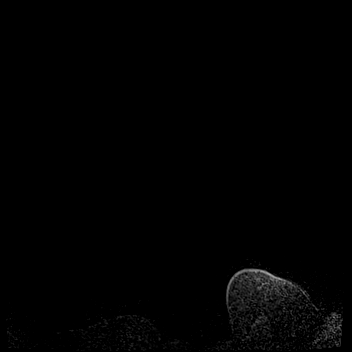

[Series 4: t1_(person_name)3(person_name)_(person_name)_(person_name)_(person_name)_+c · axial · 1.0mm · 0.94mm/px · z∈[-114,+93]mm · 7 of 207 slices shown (1 of 3)]
[im 1/207]
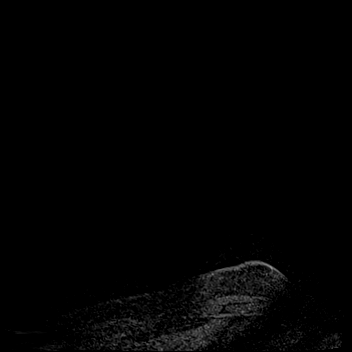
[im 35/207]
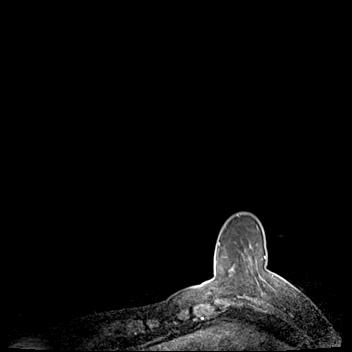
[im 69/207]
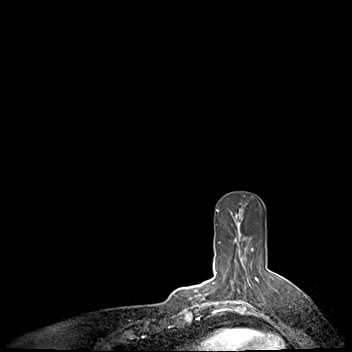
[im 104/207]
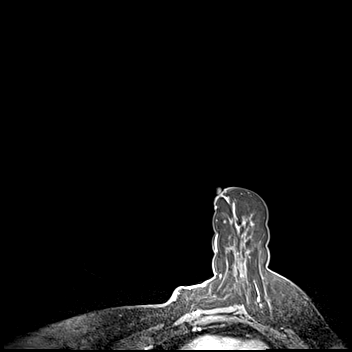
[im 138/207]
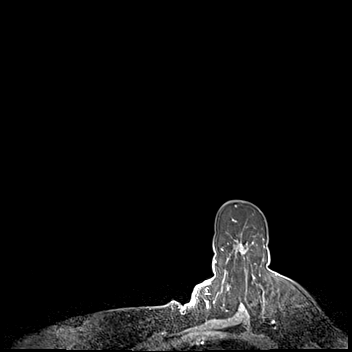
[im 172/207]
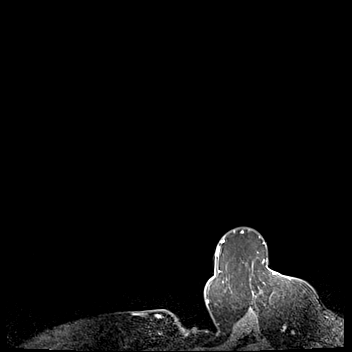
[im 207/207]
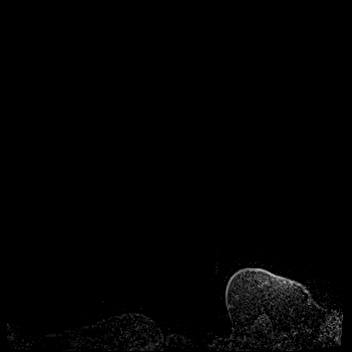

[Series 5: t1_(person_name)3(person_name)_(person_name)_(person_name)_(person_name)_+c · axial · 1.0mm · 0.94mm/px · z∈[-114,+93]mm · 7 of 205 slices shown (2 of 3)]
[im 1/205]
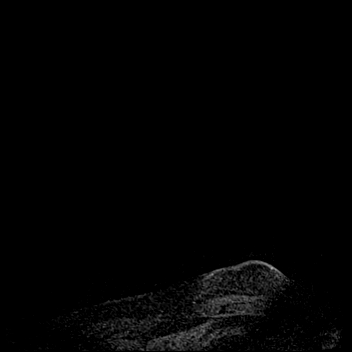
[im 35/205]
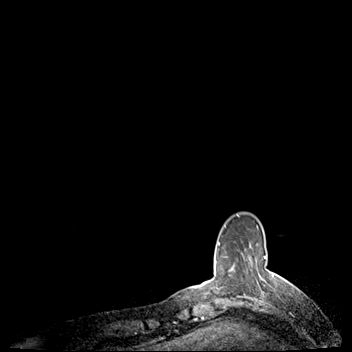
[im 69/205]
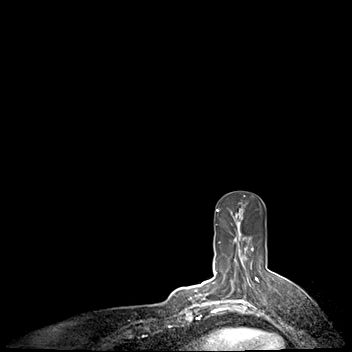
[im 103/205]
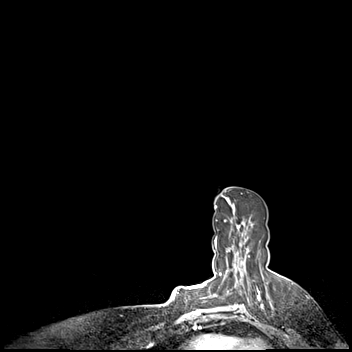
[im 137/205]
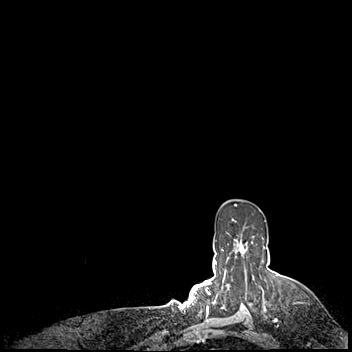
[im 171/205]
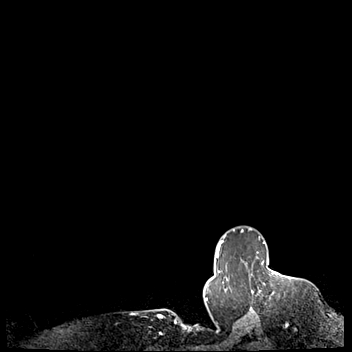
[im 205/205]
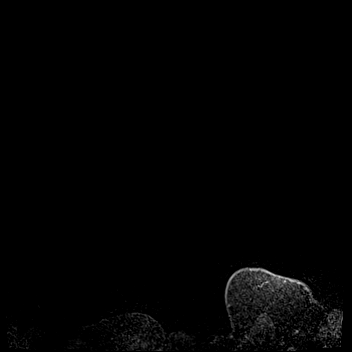

[Series 6: t1_(person_name)3(person_name)_(person_name)_(person_name)_(person_name)_+c · axial · 1.0mm · 0.94mm/px · z∈[-114,+93]mm · 7 of 207 slices shown (3 of 3)]
[im 1/207]
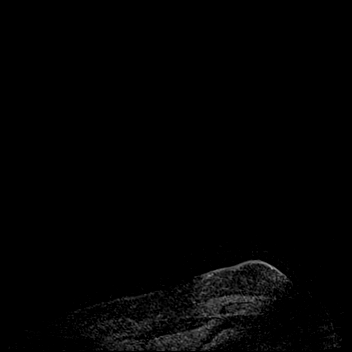
[im 35/207]
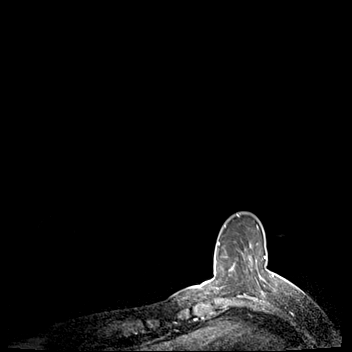
[im 69/207]
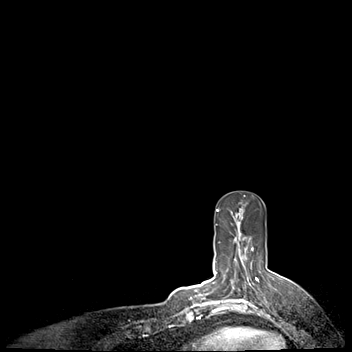
[im 104/207]
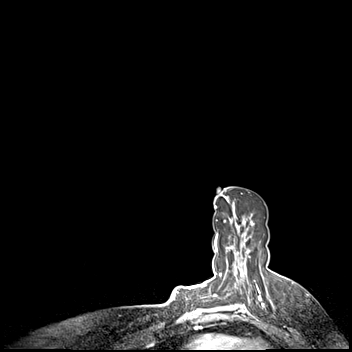
[im 138/207]
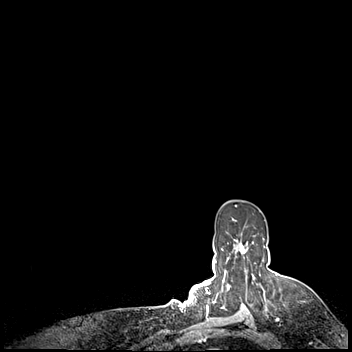
[im 172/207]
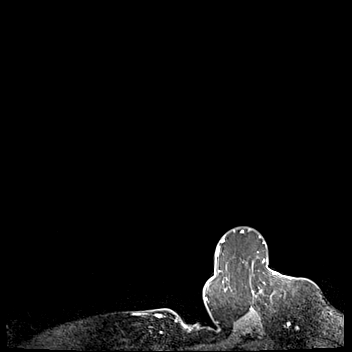
[im 207/207]
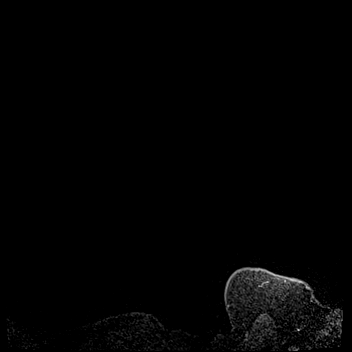

[Series 7: t1_(person_name)3(person_name)_(person_name)_(person_name)_(person_name)_needle_loc · axial · 1.0mm · 0.94mm/px · z∈[-114,+93]mm · 7 of 208 slices shown (1 of 2)]
[im 1/208]
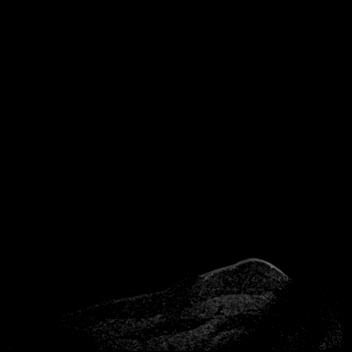
[im 35/208]
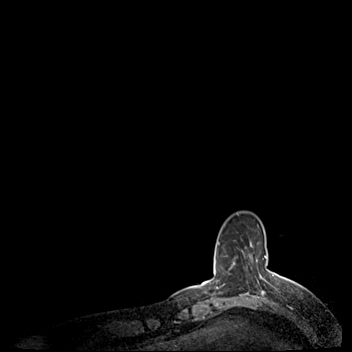
[im 70/208]
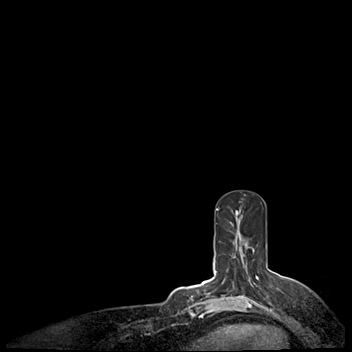
[im 104/208]
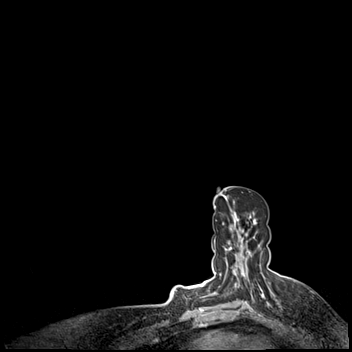
[im 139/208]
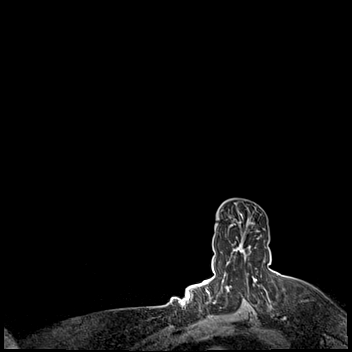
[im 173/208]
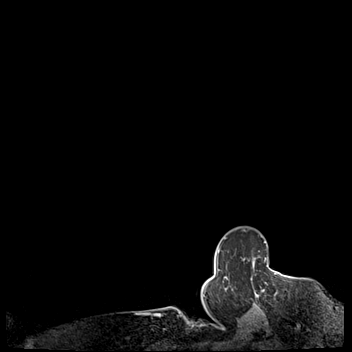
[im 208/208]
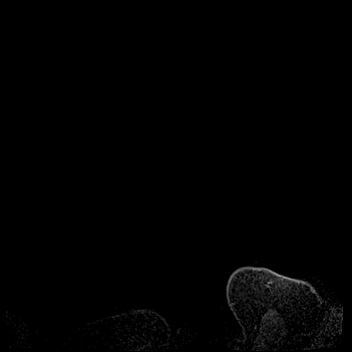

[Series 8: t1_(person_name)3(person_name)_(person_name)_(person_name)_(person_name)_needle_loc · axial · 1.0mm · 0.94mm/px · z∈[-114,+22]mm · 5 of 206 slices shown (2 of 2)]
[im 1/206]
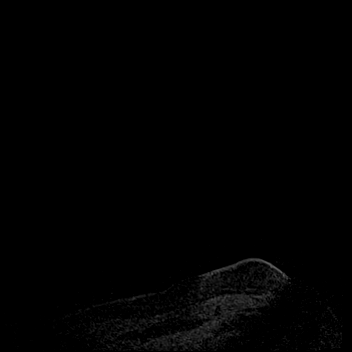
[im 35/206]
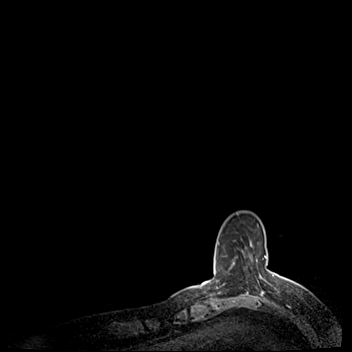
[im 69/206]
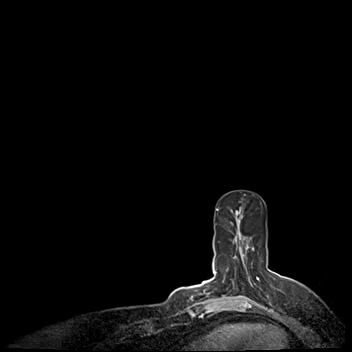
[im 103/206]
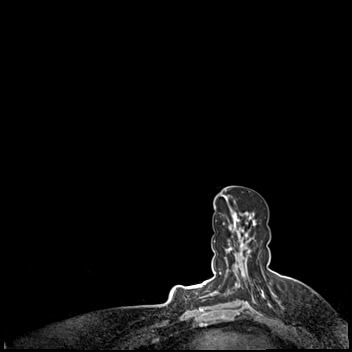
[im 137/206]
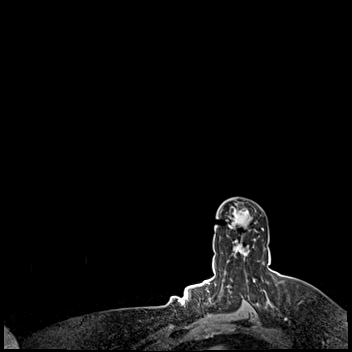

[39 of 48 positions shown; findings below may reference images not displayed]

Serial MRI imaging of the breast was performed after the injection of
15  mL Prohance intravenous  contrast. The lesion was again identified and targeted using CAD stream localization software. A medial approach was chosen. The skin was cleaned with Betadine and local
anesthesia administered percent lidocaine. Using trocar technique a needle guide was placed and positioning confirmed. Using a 9 gauge vacuum-assisted Eviva biopsy device multiple specimens were
obtained. Post sampling images confirmed successful sampling of the area. A coil shaped Hydro Mark biopsy site marker was deployed at the site of biopsy. Marker placement confirmed with post
procedure MRI imaging as well as post procedure full field digital mammography. The patient tolerated the procedure well without complication. Specimens were placed in formalin and sent to pathology.
The biopsy site markers on post procedure imaging are approximately 3 cm apart. A female technologist was present for the entire procedure.
IMPRESSION: Malignant
1.   Successful MRI guided biopsy of a 0.5 cm enhancing lesion in the [DATE] retroareolar left breast.
2.  Pathology indicates ductal carcinoma in situ intermediate to high-grade.
Results were discussed with the patient at [DATE] on 08/28/2022.
Important findings!

## 2022-08-25 IMAGING — MG MAMMO DIAG LT
2 series · 2 of 2 positions shown · non-contrast
Comparison: Previous breast MRI exam to 15/07/2022
DESCRIPTION OF PROCEDURE: The patient was positioned prone in the MRI machine and the left breast was placed in mild compression.

Images Obtained from [facility]
INDICATION: unspecified lump in the left breast, overlapping quadrants. 0.5 cm enhancing lesion in the left breast retroareolar region at [DATE]. Recently diagnosed ductal carcinoma in situ left breast

[L ML]
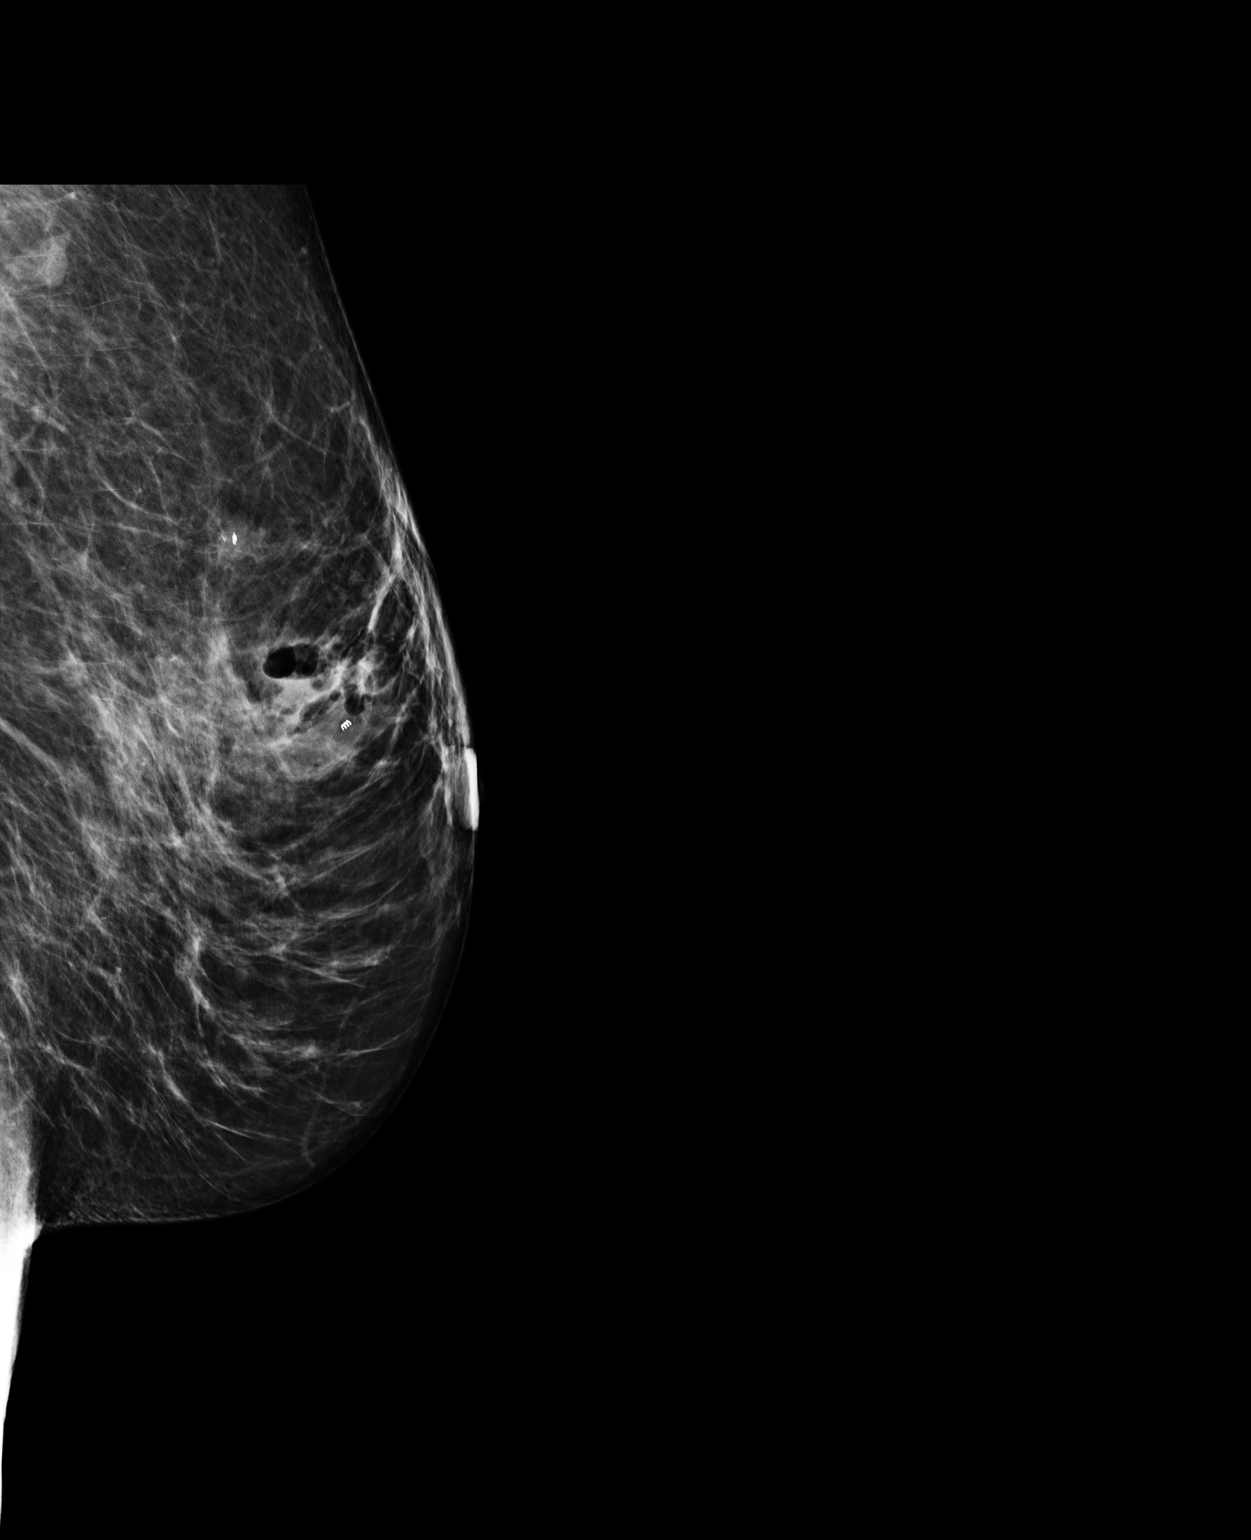

[L CC]
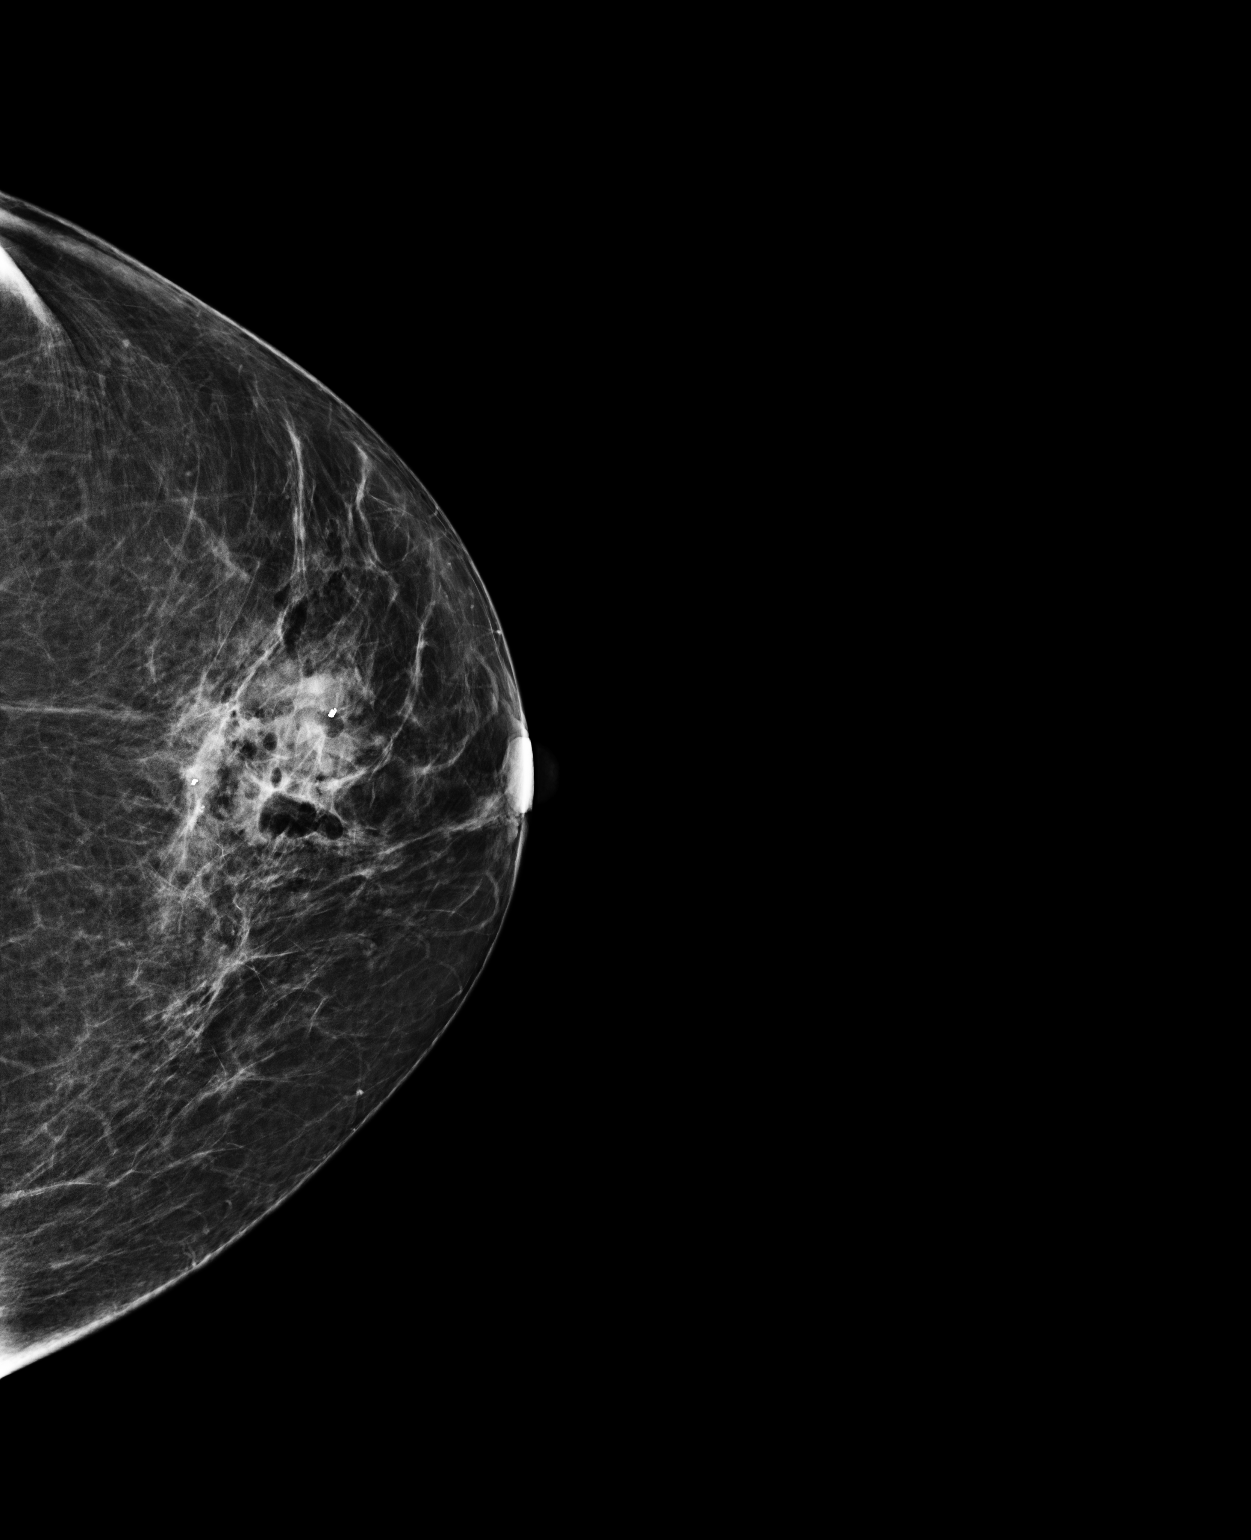

[2 of 2 positions shown; findings below may reference images not displayed]

Serial MRI imaging of the breast was performed after the injection of
15  mL Prohance intravenous  contrast. The lesion was again identified and targeted using CAD stream localization software. A medial approach was chosen. The skin was cleaned with Betadine and local
anesthesia administered percent lidocaine. Using trocar technique a needle guide was placed and positioning confirmed. Using a 9 gauge vacuum-assisted Eviva biopsy device multiple specimens were
obtained. Post sampling images confirmed successful sampling of the area. A coil shaped Hydro Mark biopsy site marker was deployed at the site of biopsy. Marker placement confirmed with post
procedure MRI imaging as well as post procedure full field digital mammography. The patient tolerated the procedure well without complication. Specimens were placed in formalin and sent to pathology.
The biopsy site markers on post procedure imaging are approximately 3 cm apart. A female technologist was present for the entire procedure.
IMPRESSION: Malignant
1.   Successful MRI guided biopsy of a 0.5 cm enhancing lesion in the [DATE] retroareolar left breast.
2.  Pathology indicates ductal carcinoma in situ intermediate to high-grade.
Results were discussed with the patient at [DATE] on 08/28/2022.
Important findings!

## 2022-11-09 IMAGING — US US NON OB TRANSVAGINAL
1 series · 14 of 28 positions shown · non-contrast
Comparison: None.

Images Obtained from [facility]
HISTORY: 57 years-old Female with Family history of ovarian cancer, personal history of breast cancer..
TECHNIQUE: Transabdominal and transvaginal ultrasound examinations of pelvis were performed. Spectral, color flow Doppler evaluation also performed.

[Series 1: us non ob transvaginal · 14 of 54 slices shown]
[im 2/54]
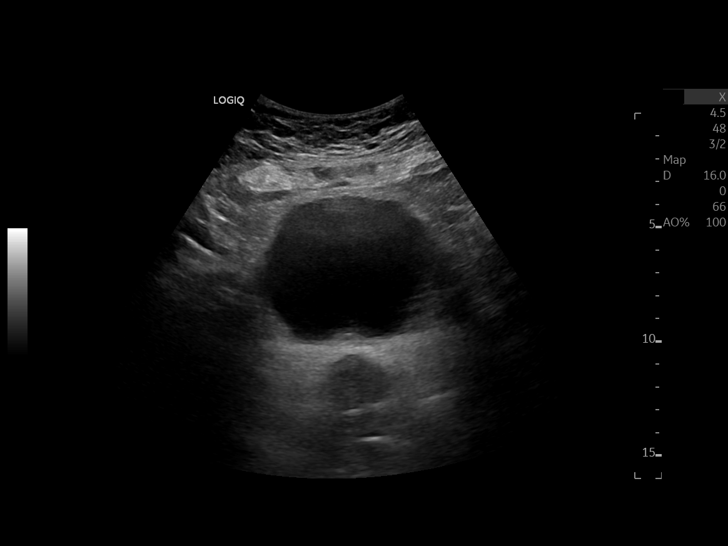
[im 6/54]
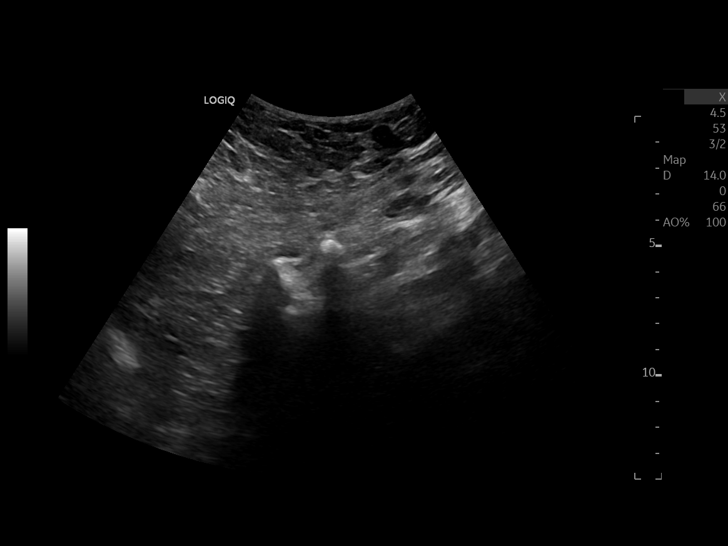
[im 10/54]
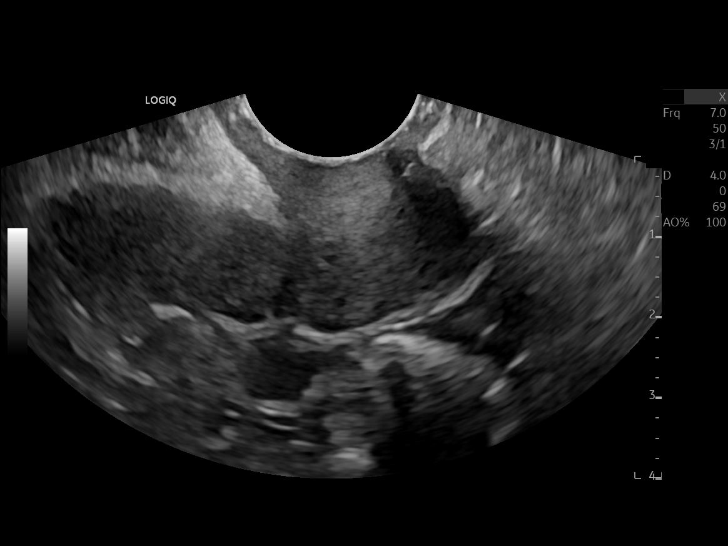
[im 14/54]
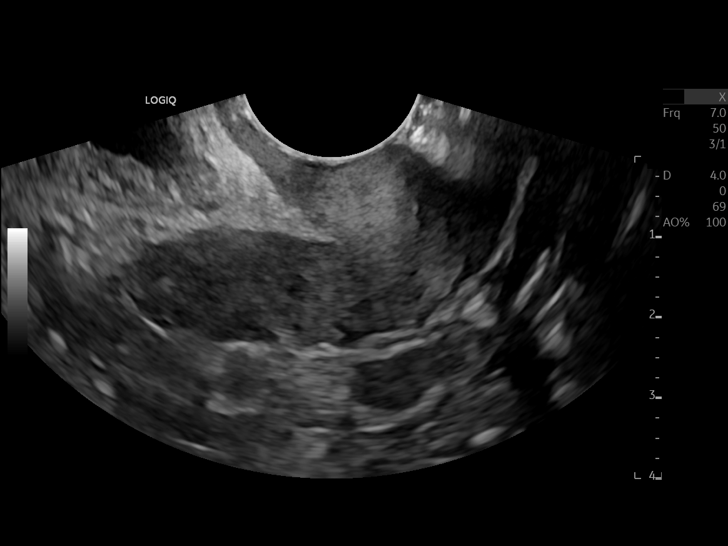
[im 18/54]
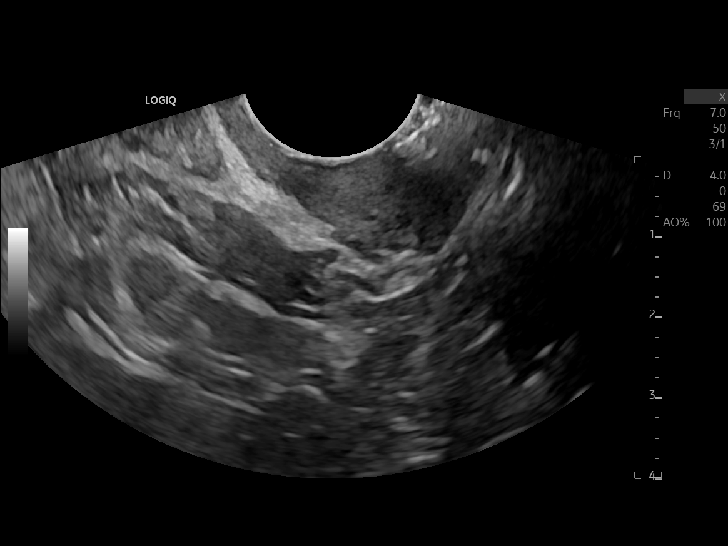
[im 22/54]
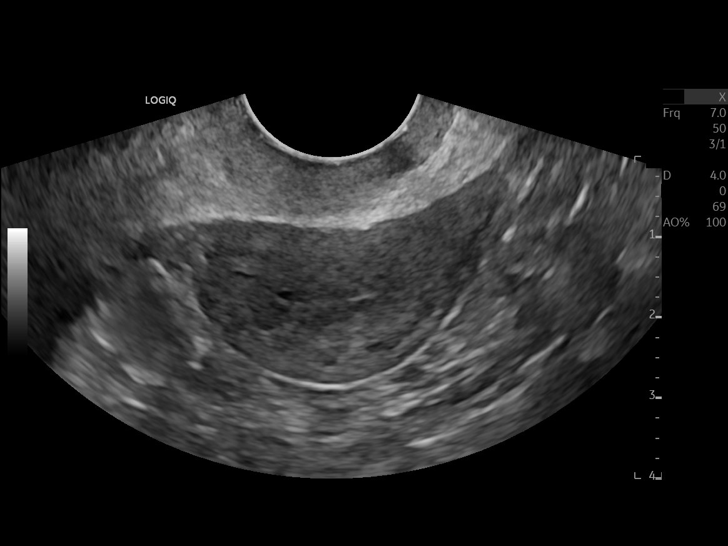
[im 26/54]
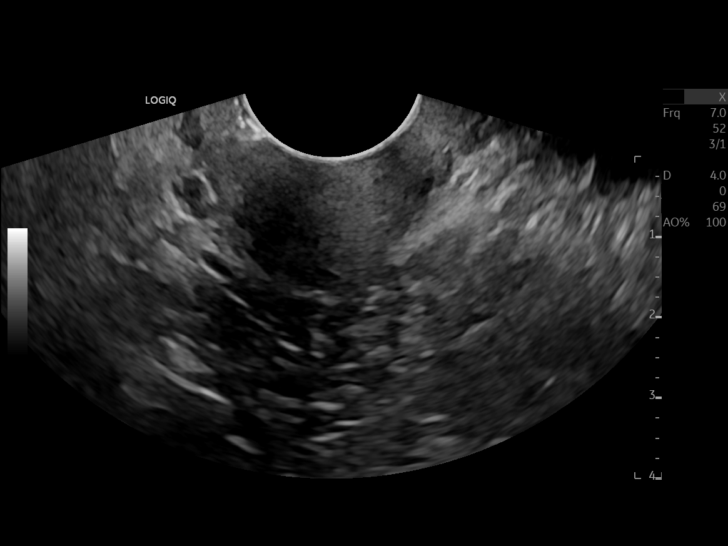
[im 30/54]
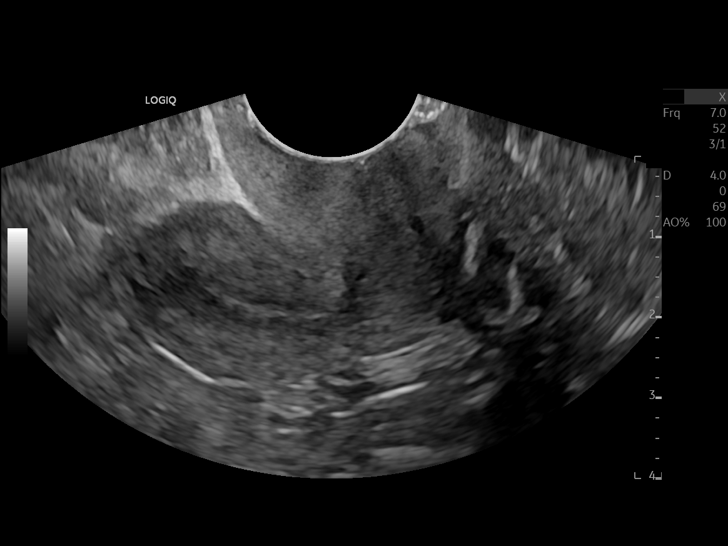
[im 34/54]
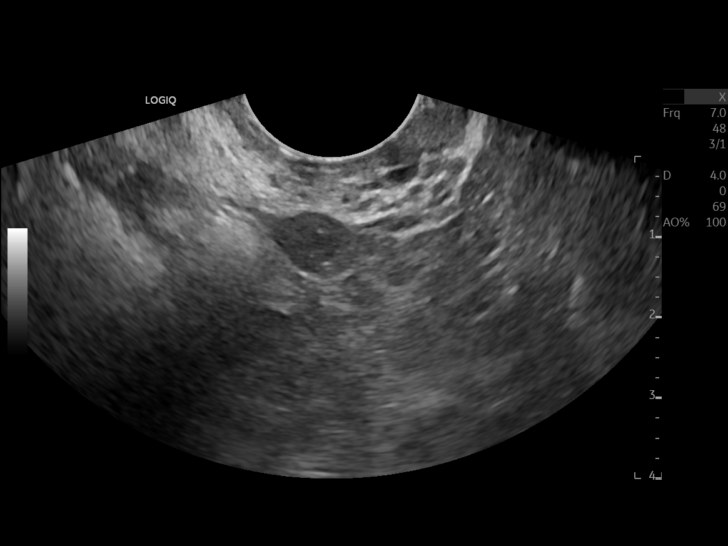
[im 38/54]
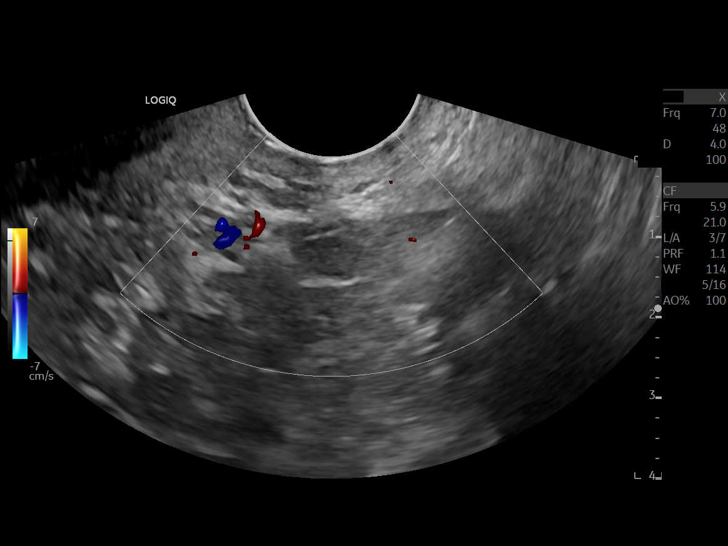
[im 42/54]
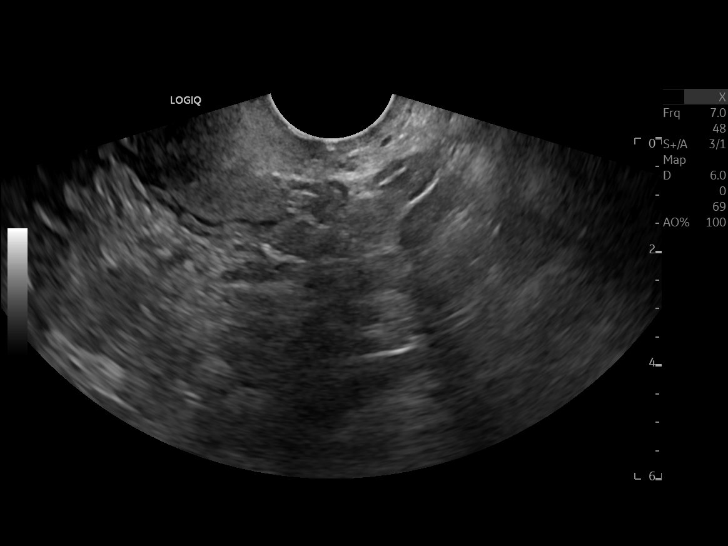
[im 46/54]
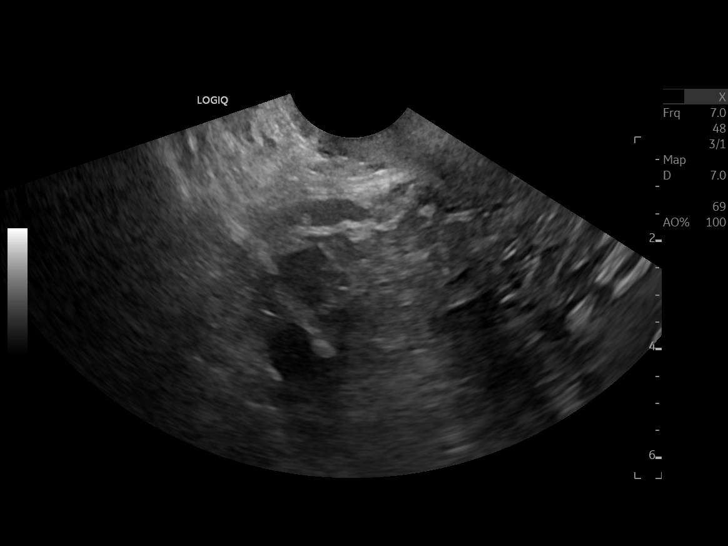
[im 50/54]
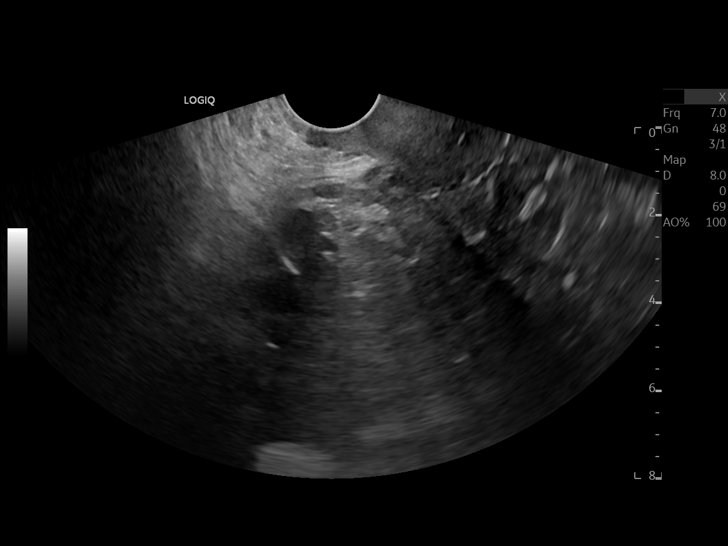
[im 54/54]
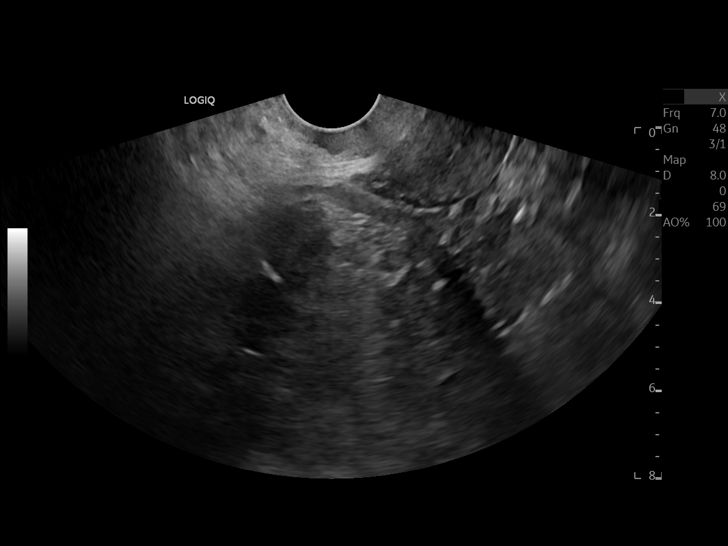

[14 of 28 positions shown; findings below may reference images not displayed]

FINDINGS: The uterus measures 51.8 x 20.7 x 34.9 mm.
The endometrium measures 3.6 mm.
The right ovary measures 23 x 9  x 14 mm. Right ovary volume is 1.5 ml.
The left ovary measures 13 x 6 x 11 mm.  Left ovary volume is 0.4 ml.
The remaining adnexa are unremarkable.
Doppler arterial and venous color flows are present in both ovaries.
Cul-de-sac: There is no significant fluid.
Others: None.
IMPRESSION: Negative pelvic ultrasound study.

## 2022-12-12 IMAGING — MR MRI BRAIN W/WO CONTRAST
11 series · 48 of 48 positions shown · IV contrast (15cc prohance)
Comparison: None.

Images Obtained from [facility]
HISTORY: 57 years-old Female with Headache, Malignant neoplasm of nipple and areola, left female breast, Dizziness and giddiness, other chronic pain.
TECHNIQUE: Pre- and postcontrast enhanced  MRI study of the brain was performed using sagittal, coronal and axial images of varying sequences.
IV contrast:  15 cc ProHance.

[Series 5: flair_axial_fs · axial · 4.0mm · 0.45mm/px · z∈[-68,+82]mm · 2 of 30 slices shown]
[im 1/30]
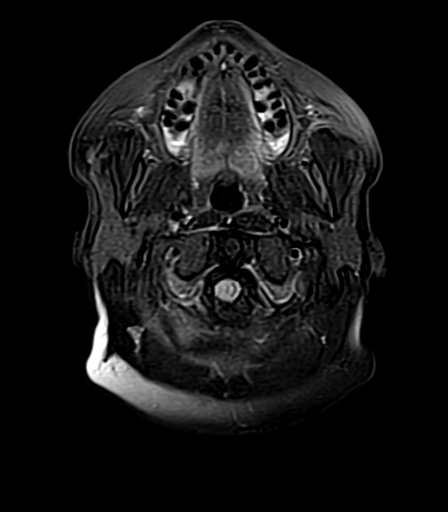
[im 30/30]
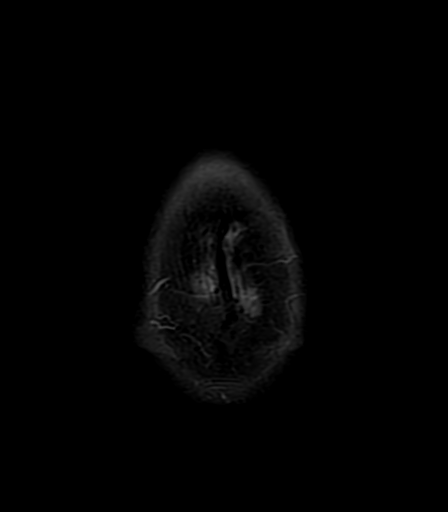

[Series 6: t2_axial · axial · 4.0mm · 0.45mm/px · 1 of 30 slices shown]
[im 1/30]
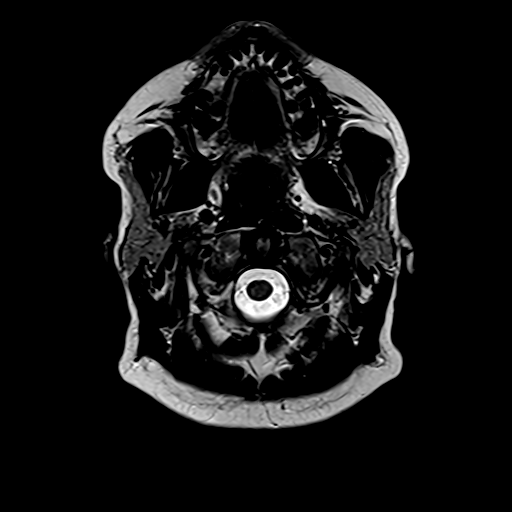

[Series 7: DWI · axial · 4.0mm · 1.31mm/px · 1 of 30 slices shown (1 of 2)]
[im 1/30]
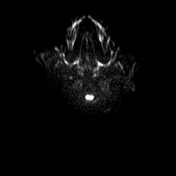

[Series 8: DWI · axial · 4.0mm · 1.31mm/px · 1 of 30 slices shown (2 of 2)]
[im 1/30]
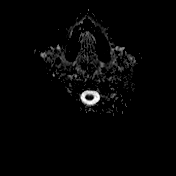

[Series 9: T1 · axial · non-contrast · 1.0mm · 0.90mm/px · z∈[-76,+96]mm · 7 of 176 slices shown (1 of 6)]
[im 1/176]
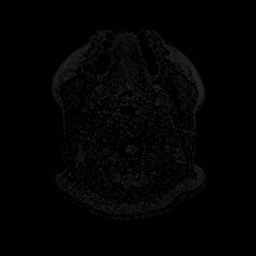
[im 30/176]
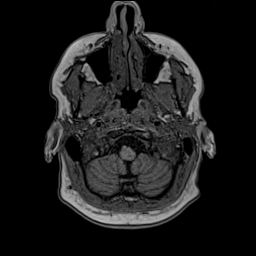
[im 59/176]
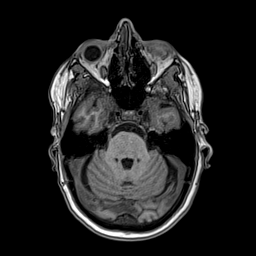
[im 88/176]
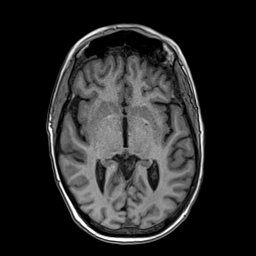
[im 117/176]
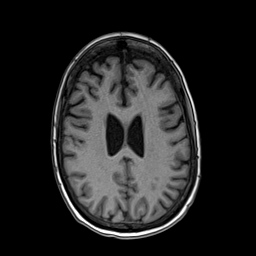
[im 146/176]
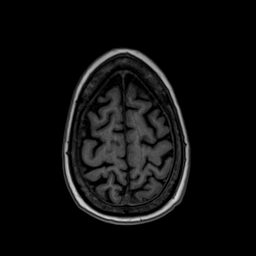
[im 176/176]
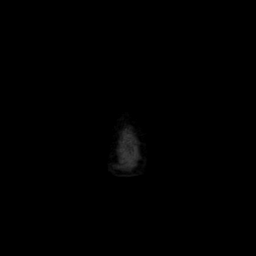

[Series 10: T1 · coronal · 1.0mm · 0.90mm/px · 7 of 160 slices shown (2 of 6)]
[im 1/160]
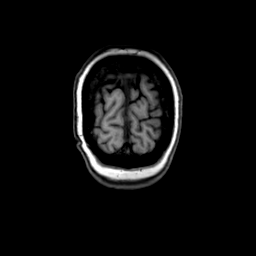
[im 27/160]
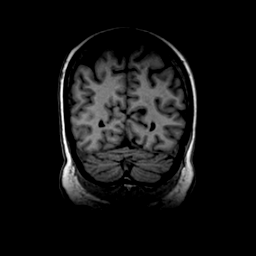
[im 54/160]
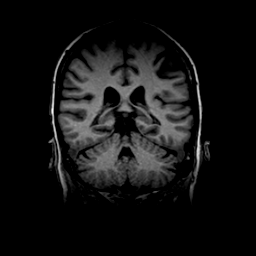
[im 80/160]
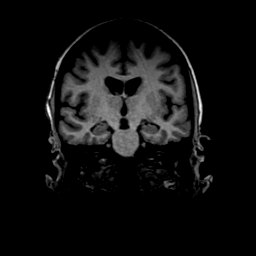
[im 107/160]
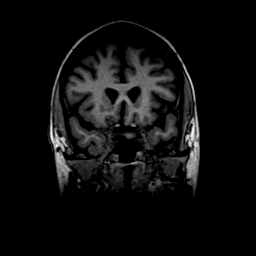
[im 133/160]
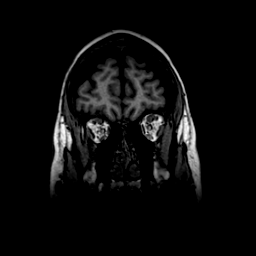
[im 160/160]
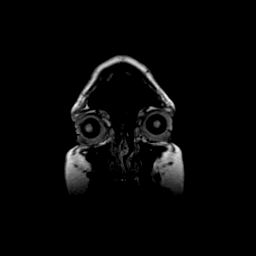

[Series 11: T1 · sagittal · 1.0mm · 0.90mm/px · 7 of 160 slices shown (3 of 6)]
[im 1/160]
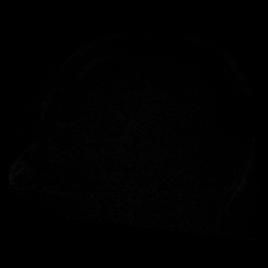
[im 27/160]
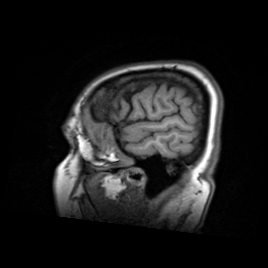
[im 54/160]
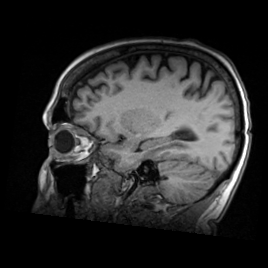
[im 80/160]
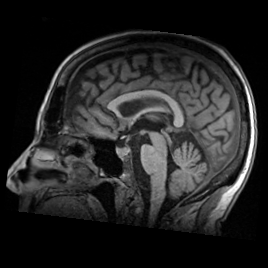
[im 107/160]
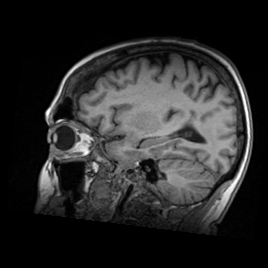
[im 133/160]
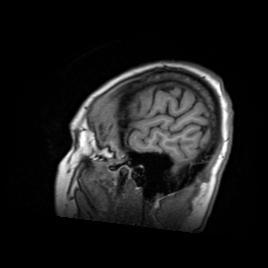
[im 160/160]
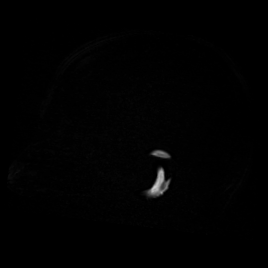

[Series 12: flash_axial · axial · 4.0mm · 0.45mm/px · 1 of 30 slices shown]
[im 1/30]
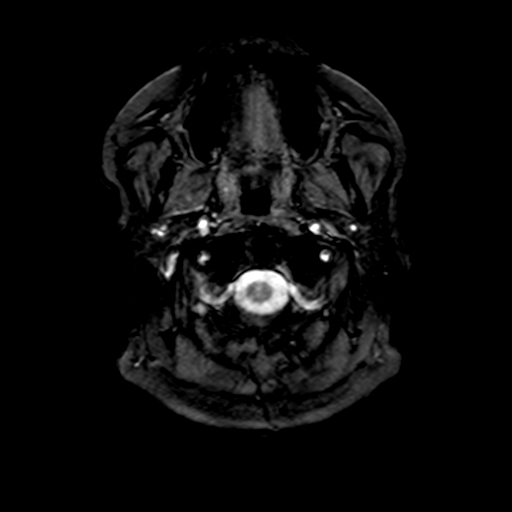

[Series 13: T1 · axial · 1.0mm · 0.90mm/px · z∈[-76,+96]mm · 7 of 176 slices shown (4 of 6)]
[im 1/176]
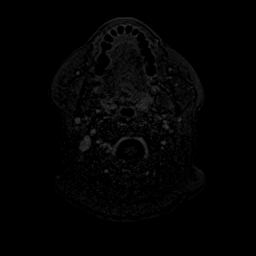
[im 30/176]
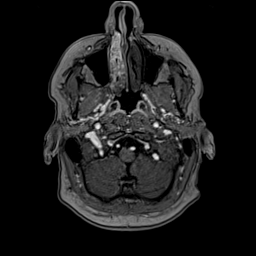
[im 59/176]
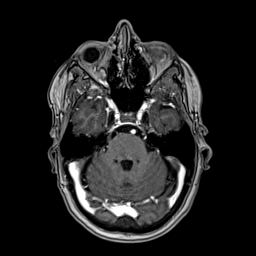
[im 88/176]
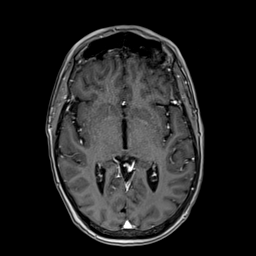
[im 117/176]
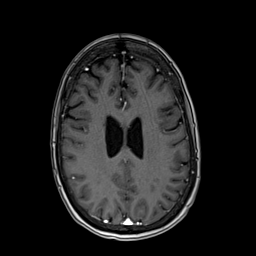
[im 146/176]
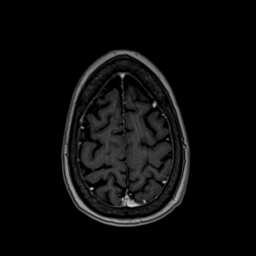
[im 176/176]
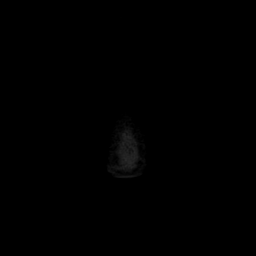

[Series 14: T1 · sagittal · 1.0mm · 0.90mm/px · 7 of 160 slices shown (5 of 6)]
[im 1/160]
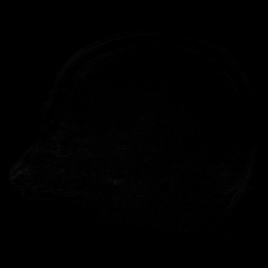
[im 27/160]
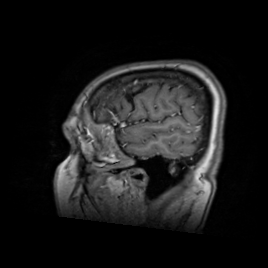
[im 54/160]
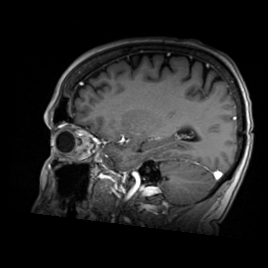
[im 80/160]
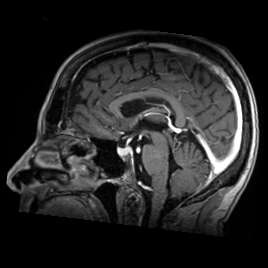
[im 107/160]
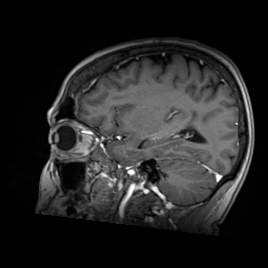
[im 133/160]
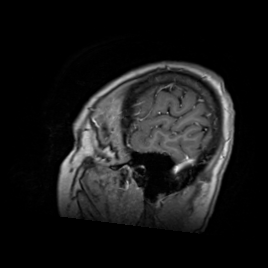
[im 160/160]
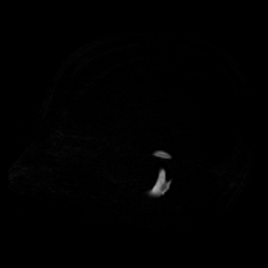

[Series 15: T1 · coronal · 1.0mm · 0.90mm/px · 7 of 160 slices shown (6 of 6)]
[im 1/160]
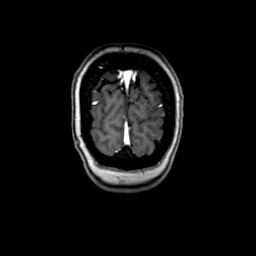
[im 27/160]
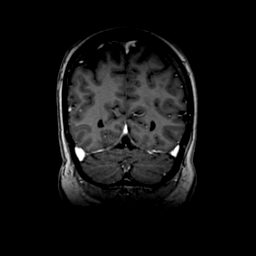
[im 54/160]
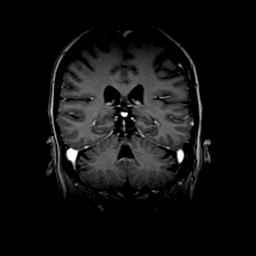
[im 80/160]
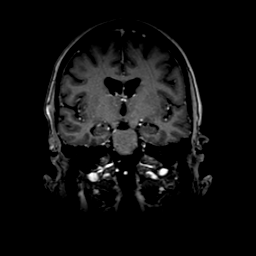
[im 107/160]
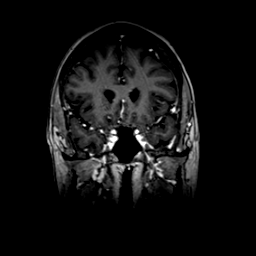
[im 133/160]
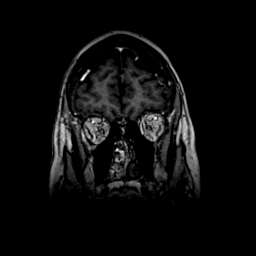
[im 160/160]
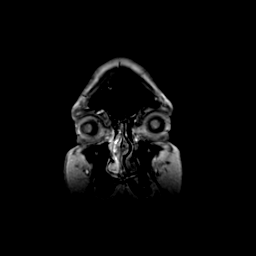

[48 of 48 positions shown; findings below may reference images not displayed]

FINDINGS: Left parietal lobe periventricular high signal around the occipital horn that has a slightly linear shaped and is low signal on T1, high signal on T2 and IR, and shows no enhancement. No
blood products on GRE. No abnormal signal on diffusion to suggest acute component. This is most consistent with a small focus of encephalomalacia.
Ventricles and sulci: The ventricles and sulci are within normal limits.
Gray/white matter: The gray/white matter differentiation is unremarkable.
Brain stem, cerebellum and midline structures: The brain stem, cerebellum and the midline structures are within normal limits.
Flow voids: Flow voids are demonstrated in the major intracranial vessels.
Sinuses and mastoid air cells: The sinuses and the mastoid air cells are within normal limits.
Globes: The globes are unremarkable.
There is no MR evidence of acute infarct, hemorrhage or enhancing intracranial lesion.
IMPRESSION: No acute disease nor evidence of metastasis.

## 2023-02-12 IMAGING — CR TIBIA-FIBULA LT 2 VWS
1 series · 2 of 2 positions shown · non-contrast
Comparison: Left knee x-ray study from same date.

Images Obtained from [facility]
HISTORY: - Repeated falls  Pain in left lower leg
TECHNIQUE: Left tibia and fibula x-ray 2 views.

[Series 1: lat · 0.17mm/px · 2 of 2 slices shown]
[im 1/2]
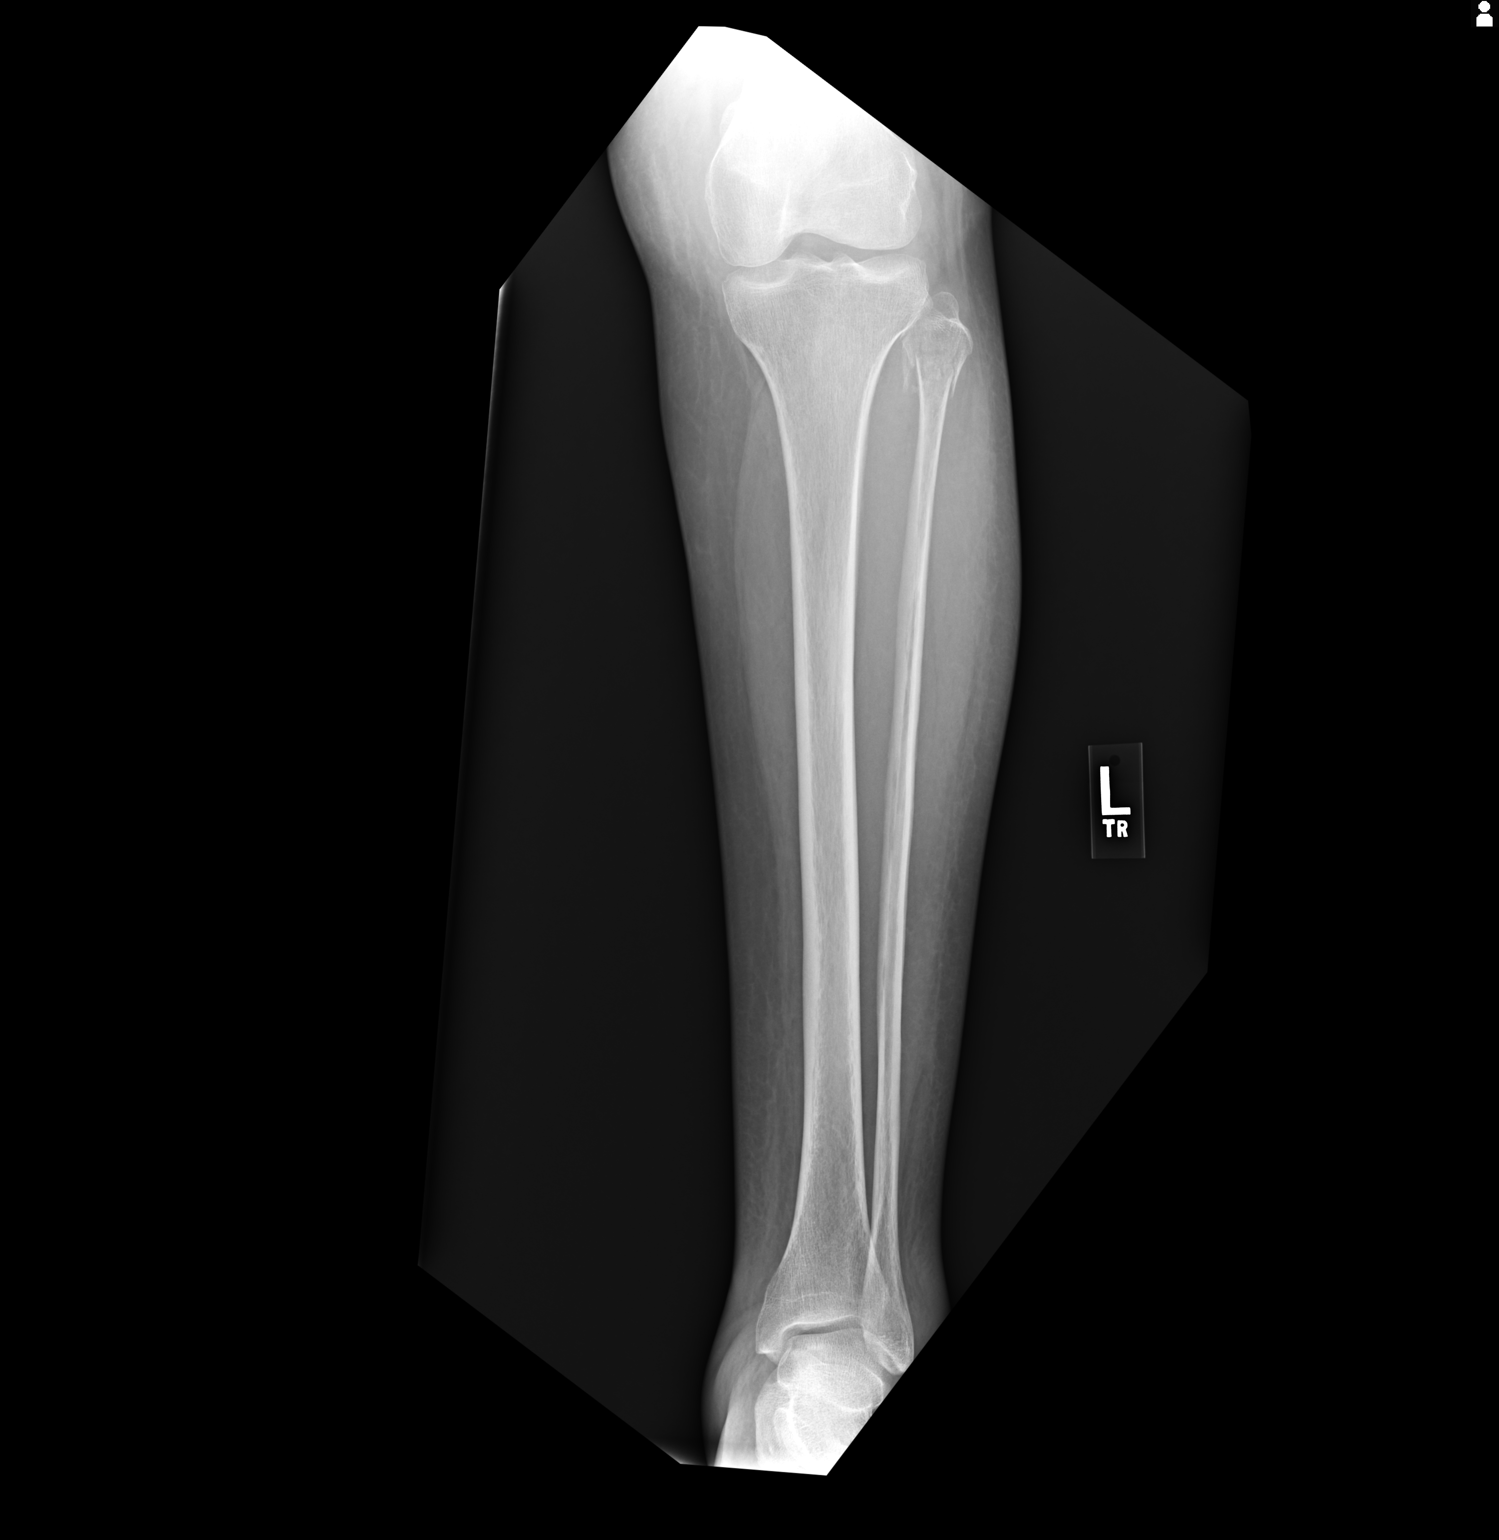
[im 2/2]
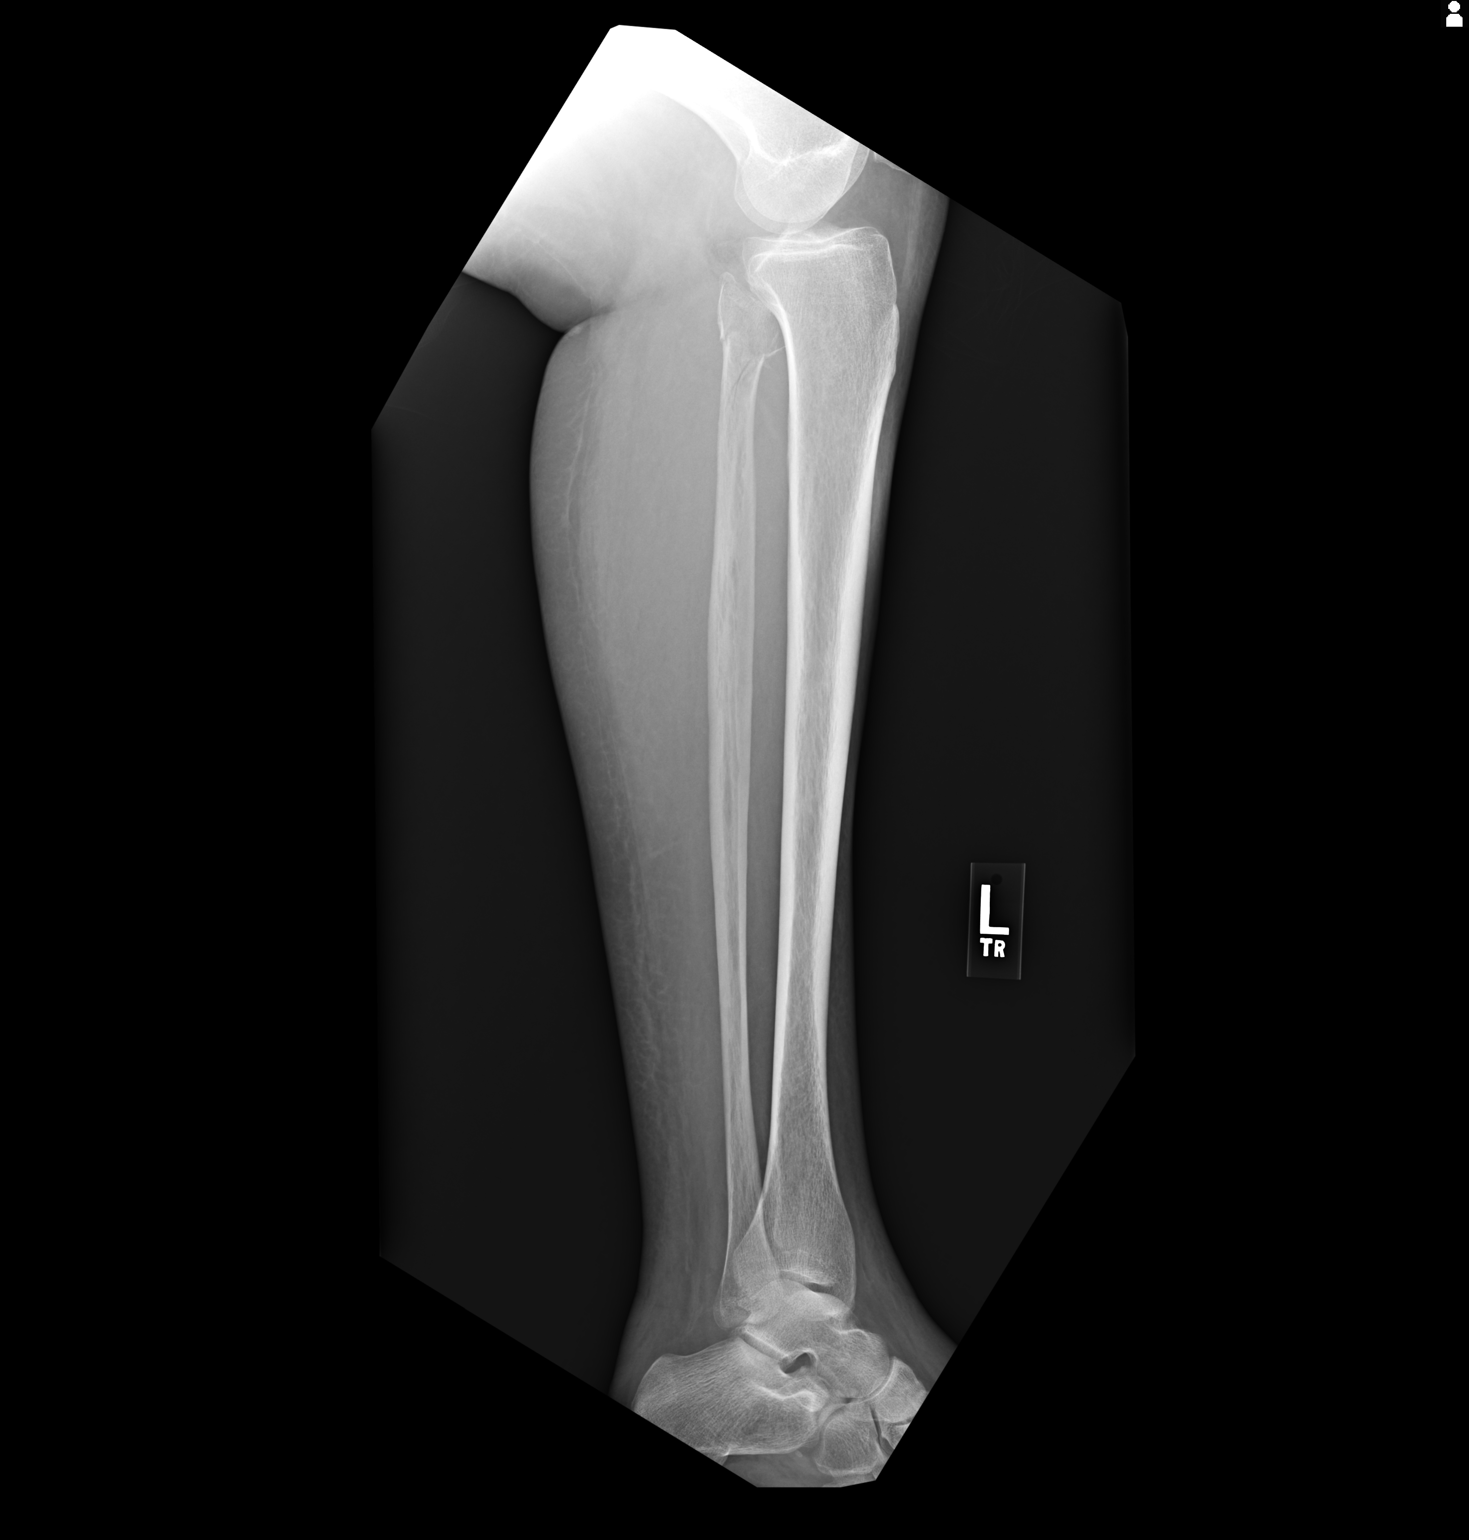

[2 of 2 positions shown; findings below may reference images not displayed]

FINDINGS: Slightly comminuted and moderately displaced fractures involving proximal metadiaphysis of left fibula identified, most prominent on AP view, to lesser degree lateral view. No other
fracture or dislocation is seen. Subcutaneous soft tissue swelling and soft tissue edema of left lower leg as well as left ankle seen.
IMPRESSION: 1.  Slightly comminuted and moderately displaced fractures involving proximal metadiaphysis of left fibula identified.
2.  Soft tissue swelling and soft tissue edema of left lower leg as well as left ankle seen.

## 2023-02-12 IMAGING — CR TIBIA-FIBULA RT 2 VWS
1 series · 2 of 2 positions shown · non-contrast
Comparison: Right knee x-ray study from same day.

Images Obtained from [facility]
HISTORY: - Repeated falls  Pain in right lower leg
TECHNIQUE: Right tibia and fibula x-ray 2 views.

[Series 1: ap · 0.17mm/px · 2 of 2 slices shown]
[im 1/2]
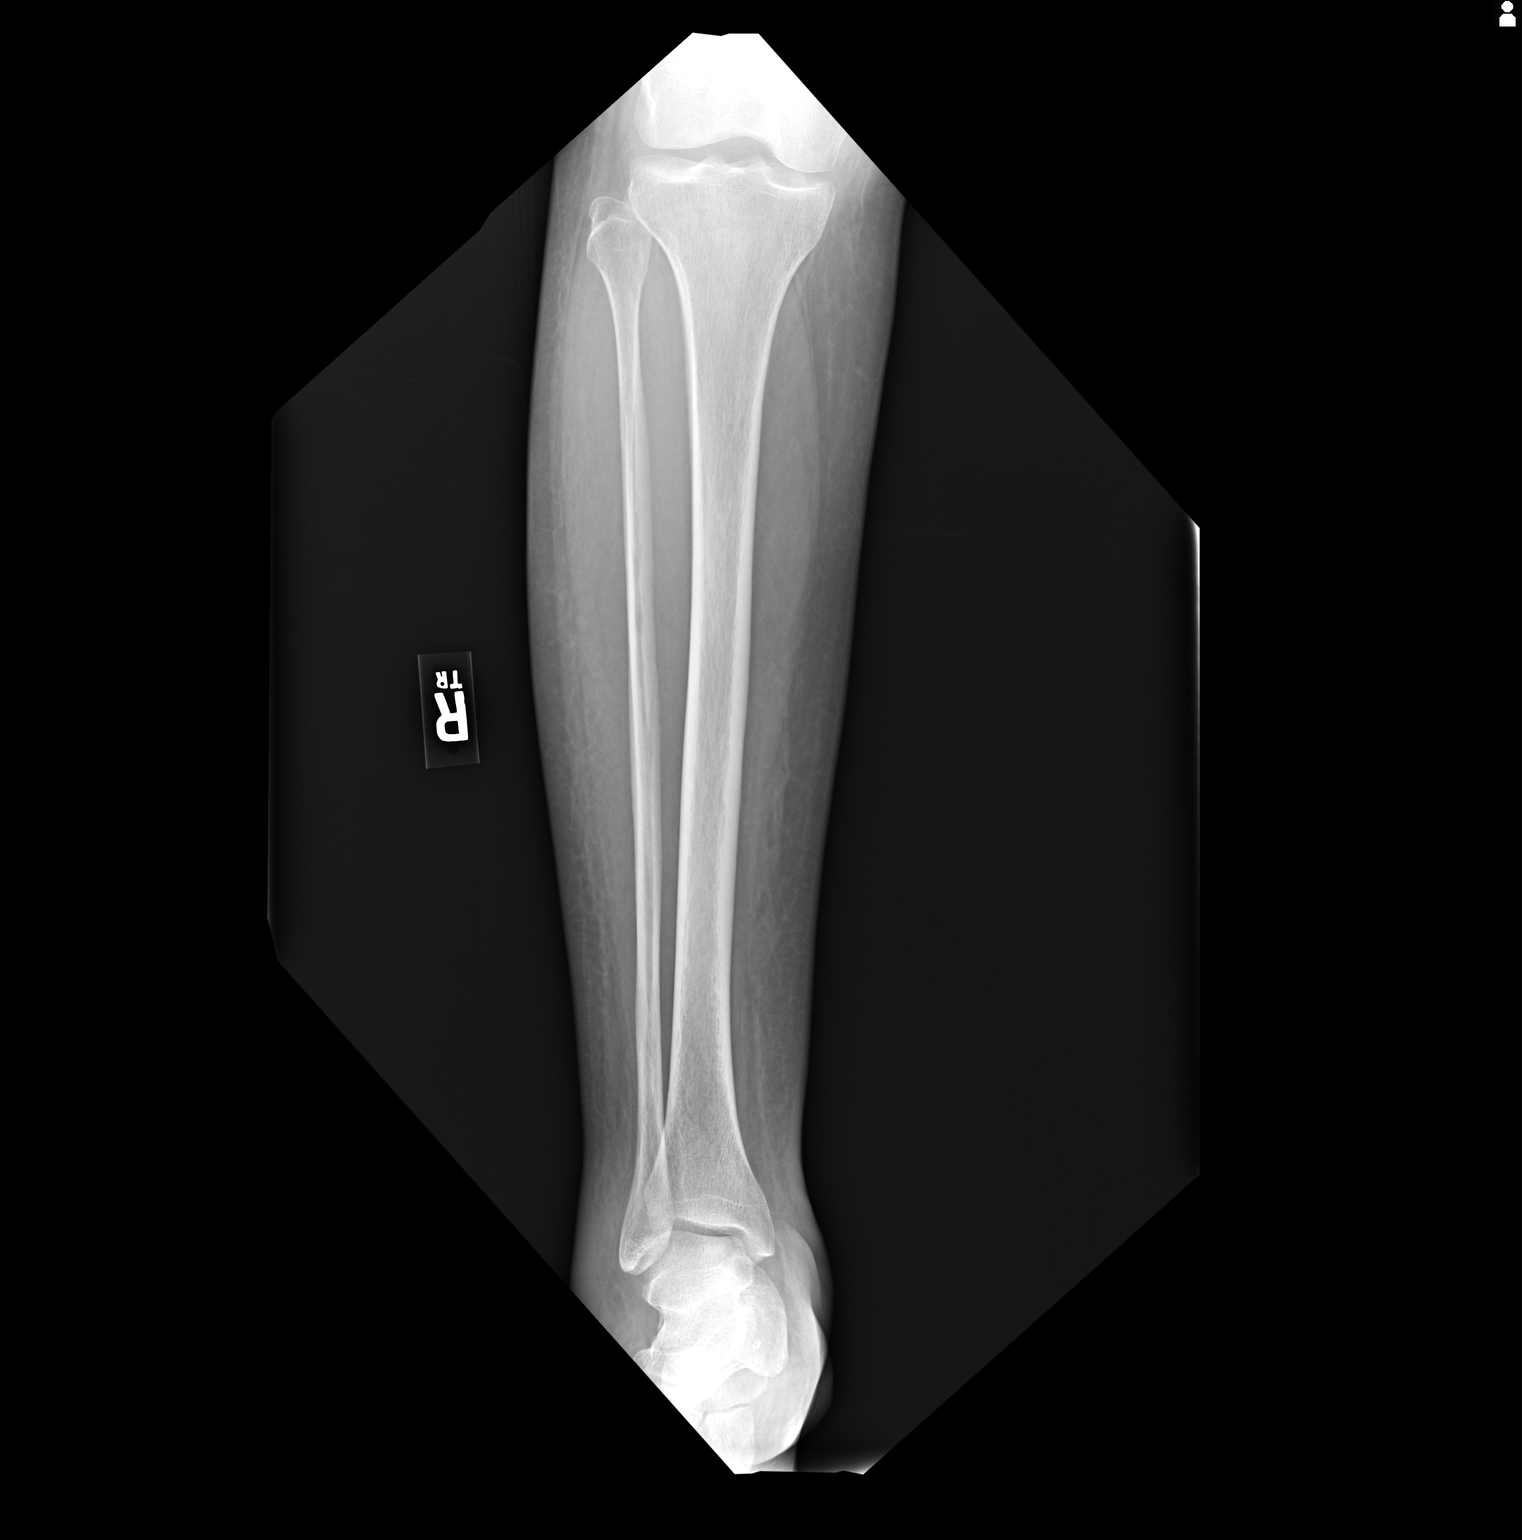
[im 2/2]
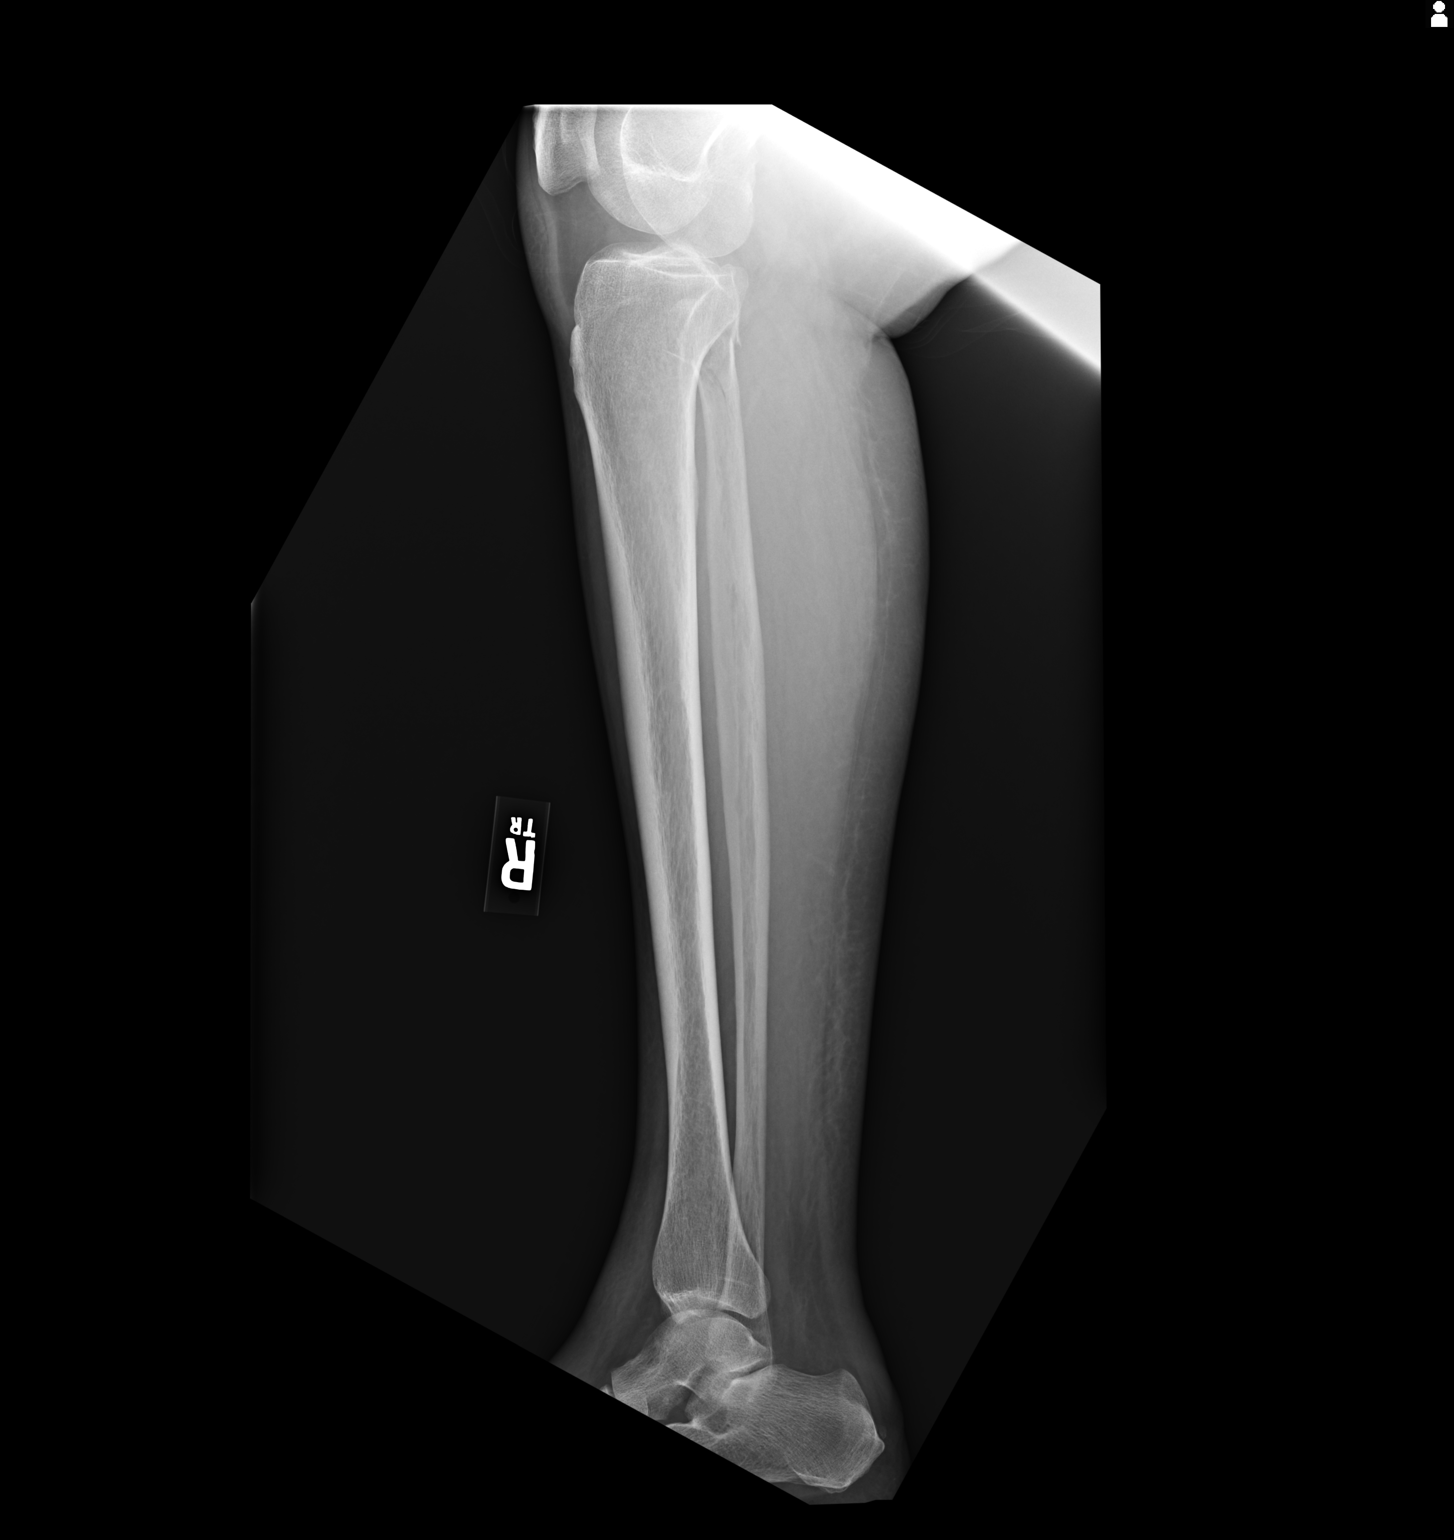

[2 of 2 positions shown; findings below may reference images not displayed]

FINDINGS: Subtle minimally displaced fracture involving proximal metadiaphysis of right fibula identified, best seen on lateral view. No other fracture or dislocation is seen. Soft tissue swelling
right ankle and right lower leg with appearance of soft tissue edema seen.
IMPRESSION: 1.  Subtle minimally displaced fracture involving proximal metadiaphysis of right fibula identified.
2.  Soft tissue swelling of right ankle and right lower leg with appearance of soft tissue edema seen.

## 6200-09-24 DEATH — deceased
